# Patient Record
Sex: Female | Born: 1965 | Race: White | Hispanic: No | Marital: Married | Smoking: Former smoker
Health system: Southern US, Community
[De-identification: ages and names within clinical notes are randomized; demographics above are authoritative.]

## PROBLEM LIST (undated history)

## (undated) DIAGNOSIS — I471 Supraventricular tachycardia, unspecified: Secondary | ICD-10-CM

## (undated) DIAGNOSIS — E079 Disorder of thyroid, unspecified: Secondary | ICD-10-CM

## (undated) DIAGNOSIS — L900 Lichen sclerosus et atrophicus: Secondary | ICD-10-CM

## (undated) DIAGNOSIS — G4730 Sleep apnea, unspecified: Secondary | ICD-10-CM

## (undated) HISTORY — PX: TONSILLECTOMY: SUR1361

## (undated) HISTORY — DX: Sleep apnea, unspecified: G47.30

## (undated) HISTORY — DX: Lichen sclerosus et atrophicus: L90.0

## (undated) HISTORY — DX: Disorder of thyroid, unspecified: E07.9

## (undated) HISTORY — DX: Supraventricular tachycardia, unspecified: I47.10

---

## 1994-11-02 HISTORY — PX: CHOLECYSTECTOMY: SHX55

## 2015-12-05 ENCOUNTER — Ambulatory Visit (INDEPENDENT_AMBULATORY_CARE_PROVIDER_SITE_OTHER): Payer: BLUE CROSS/BLUE SHIELD | Admitting: Sports Medicine

## 2015-12-05 ENCOUNTER — Encounter: Payer: Self-pay | Admitting: Sports Medicine

## 2015-12-05 DIAGNOSIS — R079 Chest pain, unspecified: Secondary | ICD-10-CM

## 2015-12-05 DIAGNOSIS — E038 Other specified hypothyroidism: Secondary | ICD-10-CM

## 2015-12-05 DIAGNOSIS — F419 Anxiety disorder, unspecified: Secondary | ICD-10-CM | POA: Insufficient documentation

## 2015-12-05 DIAGNOSIS — E034 Atrophy of thyroid (acquired): Secondary | ICD-10-CM

## 2015-12-05 DIAGNOSIS — M2550 Pain in unspecified joint: Secondary | ICD-10-CM

## 2015-12-05 DIAGNOSIS — E039 Hypothyroidism, unspecified: Secondary | ICD-10-CM | POA: Insufficient documentation

## 2015-12-05 DIAGNOSIS — Z0000 Encounter for general adult medical examination without abnormal findings: Secondary | ICD-10-CM

## 2015-12-05 DIAGNOSIS — F32A Depression, unspecified: Secondary | ICD-10-CM | POA: Insufficient documentation

## 2015-12-05 DIAGNOSIS — R768 Other specified abnormal immunological findings in serum: Secondary | ICD-10-CM

## 2015-12-05 DIAGNOSIS — F411 Generalized anxiety disorder: Secondary | ICD-10-CM

## 2015-12-05 DIAGNOSIS — Z136 Encounter for screening for cardiovascular disorders: Secondary | ICD-10-CM | POA: Insufficient documentation

## 2015-12-05 DIAGNOSIS — R7689 Other specified abnormal immunological findings in serum: Secondary | ICD-10-CM | POA: Insufficient documentation

## 2015-12-05 MED ORDER — DULOXETINE HCL 30 MG PO CPEP: 30.0000 mg | ORAL_CAPSULE | Freq: Every day | ORAL | Status: DC

## 2015-12-05 NOTE — Assessment & Plan Note (Addendum)
Checking TSH. Has been seeing an integrative doctor.  Currently having iatrogenic hyperthyroidism with a suppressed TSH, adding T3 and T4, patient needs to return to her integrative medicine doctor for thyroid medication adjustment.

## 2015-12-05 NOTE — Assessment & Plan Note (Signed)
Referral for stress testing

## 2015-12-05 NOTE — Assessment & Plan Note (Signed)
Declines all vaccinations,up-to-date on cervical cancer screening

## 2015-12-05 NOTE — Assessment & Plan Note (Signed)
Also has significant widespread muscle aches. She likely has fibromyalgia in addition to generalized anxiety. Adding Cymbalta 30.

## 2015-12-05 NOTE — Progress Notes (Signed)
  Subjective:    CC: Establish care.   HPI:  This is a pleasant 50 year old female, she is here to discuss multiple issues, the most pressing of which is generalized anxiety. She has episodes of panic attacks, with occasional chest pain and palpitations. She has been hospitalized for cardiac rule outs but she has never had a stress test nor a catheterization.  On further questioning she has severe anhedonia, poor energy, poor appetite, guilt, difficulty concentrating, moderate depressed mood, difficulty sleeping, mild psychomotor retardation, and no suicidal or homicidal ideation, she also has severe nervousness, difficulty controlling her worry, worrying about different things, trouble relaxing, fear of impending doom, and moderate restlessness and irritability. She is agreeable to try pharmacologic intervention.  Hypothyroidism: Currently seeing an integrative medicine doctor.  Polyarthralgia: Multiple joints, hips, shoulders.  Past medical history, Surgical history, Family history not pertinant except as noted below, Social history, Allergies, and medications have been entered into the medical record, reviewed, and no changes needed.   Review of Systems: No headache, visual changes, nausea, vomiting, diarrhea, constipation, dizziness, abdominal pain, skin rash, fevers, chills, night sweats, swollen lymph nodes, weight loss, chest pain, body aches, joint swelling, muscle aches, shortness of breath, mood changes, visual or auditory hallucinations.  Objective:    General: Well Developed, well nourished, and in no acute distress.  Neuro: Alert and oriented x3, extra-ocular muscles intact, sensation grossly intact.  HEENT: Normocephalic, atraumatic, pupils equal round reactive to light, neck supple, no masses, no lymphadenopathy, thyroid nonpalpable.  Skin: Warm and dry, no rashes noted.  Cardiac: Regular rate and rhythm, no murmurs rubs or gallops.  Respiratory: Clear to auscultation  bilaterally. Not using accessory muscles, speaking in full sentences.  Abdominal: Soft, nontender, nondistended, positive bowel sounds, no masses, no organomegaly.  Musculoskeletal: Shoulder, elbow, wrist, hip, knee, ankle stable, and with full range of motion.  Impression and Recommendations:    The patient was counselled, risk factors were discussed, anticipatory guidance given.  I spent 45 minutes with this patient, greater than 50% was face-to-face time counseling regarding the above diagnoses

## 2015-12-05 NOTE — Assessment & Plan Note (Addendum)
Adding rheumatoid factor, CCP, and ANA.  Positive ANA with 1-80 titer, speckled pattern, and confirmatory lupus panel.

## 2015-12-06 ENCOUNTER — Telehealth: Payer: Self-pay

## 2015-12-06 DIAGNOSIS — F411 Generalized anxiety disorder: Secondary | ICD-10-CM

## 2015-12-06 MED ORDER — ALPRAZOLAM 0.5 MG PO TBDP: 0.5000 mg | ORAL_TABLET | Freq: Two times a day (BID) | ORAL | Status: DC | PRN

## 2015-12-06 NOTE — Telephone Encounter (Signed)
Continue Cymbalta for at least one month, adding a bit of Xanax.

## 2015-12-06 NOTE — Telephone Encounter (Signed)
Pt left 2 VM, first stating that she would like to switch from cymbalta to zoloft. Pt then returned call stating she will try the Cymbalta for a month but would like to know if you are willing to give her some Ativan or Xanax for panick attacks. Please advise.

## 2015-12-06 NOTE — Telephone Encounter (Signed)
Pt notified and appreciates your assistance

## 2015-12-19 LAB — CBC
HCT: 40.5 % (ref 36.0–46.0)
Hemoglobin: 13.1 g/dL (ref 12.0–15.0)
MCH: 27.3 pg (ref 26.0–34.0)
MCHC: 32.3 g/dL (ref 30.0–36.0)
MCV: 84.6 fL (ref 78.0–100.0)
MPV: 9.1 fL (ref 8.6–12.4)
Platelets: 364 10*3/uL (ref 150–400)
RBC: 4.79 MIL/uL (ref 3.87–5.11)
RDW: 14 % (ref 11.5–15.5)
WBC: 7.3 K/uL (ref 4.0–10.5)

## 2015-12-19 LAB — HEMOGLOBIN A1C
Hgb A1c MFr Bld: 5.8 % — ABNORMAL HIGH (ref <5.7)
Mean Plasma Glucose: 120 mg/dL — ABNORMAL HIGH (ref <117)

## 2015-12-20 ENCOUNTER — Encounter: Payer: BLUE CROSS/BLUE SHIELD | Admitting: Physician Assistant

## 2015-12-20 ENCOUNTER — Ambulatory Visit (INDEPENDENT_AMBULATORY_CARE_PROVIDER_SITE_OTHER): Payer: BLUE CROSS/BLUE SHIELD

## 2015-12-20 DIAGNOSIS — R079 Chest pain, unspecified: Secondary | ICD-10-CM

## 2015-12-20 LAB — EXERCISE TOLERANCE TEST
Estimated workload: 7 METS
Exercise duration (min): 5 min
Exercise duration (sec): 45 s
MPHR: 173 {beats}/min
Peak BP: 193 mmHg
Peak HR: 181 {beats}/min
Percent HR: 105 %
Percent of predicted max HR: 104 %
RPE: 15
Rest HR: 84 {beats}/min
Stage 1 DBP: 67 mmHg
Stage 1 Grade: 0 %
Stage 1 HR: 110 {beats}/min
Stage 1 SBP: 114 mmHg
Stage 1 Speed: 0 mph
Stage 2 Grade: 0 %
Stage 2 HR: 113 {beats}/min
Stage 2 Speed: 1 mph
Stage 3 Grade: 0.1 %
Stage 3 HR: 114 {beats}/min
Stage 3 Speed: 1 mph
Stage 4 DBP: 73 mmHg
Stage 4 Grade: 10 %
Stage 4 HR: 162 {beats}/min
Stage 4 SBP: 158 mmHg
Stage 4 Speed: 1.7 mph
Stage 5 DBP: 67 mmHg
Stage 5 Grade: 12 %
Stage 5 HR: 181 {beats}/min
Stage 5 SBP: 193 mmHg
Stage 5 Speed: 2.5 mph
Stage 6 DBP: 46 mmHg
Stage 6 Grade: 0 %
Stage 6 HR: 164 {beats}/min
Stage 6 SBP: 219 mmHg
Stage 6 Speed: 0 mph
Stage 7 DBP: 60 mmHg
Stage 7 Grade: 0 %
Stage 7 HR: 109 {beats}/min
Stage 7 SBP: 107 mmHg
Stage 7 Speed: 0 mph

## 2015-12-20 LAB — COMPREHENSIVE METABOLIC PANEL WITH GFR
ALT: 17 U/L (ref 6–29)
AST: 15 U/L (ref 10–35)
Albumin: 3.9 g/dL (ref 3.6–5.1)
Alkaline Phosphatase: 70 U/L (ref 33–115)
Calcium: 8.9 mg/dL (ref 8.6–10.2)
Glucose, Bld: 102 mg/dL — ABNORMAL HIGH (ref 65–99)
Total Bilirubin: 0.4 mg/dL (ref 0.2–1.2)
Total Protein: 6.3 g/dL (ref 6.1–8.1)

## 2015-12-20 LAB — LIPID PANEL
Cholesterol: 126 mg/dL (ref 125–200)
HDL: 40 mg/dL — ABNORMAL LOW (ref >=46)
LDL Cholesterol: 69 mg/dL (ref <130)
Total CHOL/HDL Ratio: 3.2 Ratio (ref <=5.0)
Triglycerides: 86 mg/dL (ref <150)
VLDL: 17 mg/dL (ref <30)

## 2015-12-20 LAB — TSH: TSH: 0.02 mIU/L — ABNORMAL LOW

## 2015-12-20 LAB — VITAMIN D 25 HYDROXY (VIT D DEFICIENCY, FRACTURES): Vit D, 25-Hydroxy: 71 ng/mL (ref 30–100)

## 2015-12-20 LAB — COMPREHENSIVE METABOLIC PANEL
BUN: 10 mg/dL (ref 7–25)
CO2: 26 mmol/L (ref 20–31)
Chloride: 104 mmol/L (ref 98–110)
Creat: 0.58 mg/dL (ref 0.50–1.10)
Potassium: 4.2 mmol/L (ref 3.5–5.3)
Sodium: 138 mmol/L (ref 135–146)

## 2015-12-20 LAB — ANTI-NUCLEAR AB-TITER (ANA TITER): ANA Titer 1: 1:80 {titer} — ABNORMAL HIGH

## 2015-12-20 LAB — ANA: Anti Nuclear Antibody(ANA): POSITIVE — AB

## 2015-12-20 LAB — CYCLIC CITRUL PEPTIDE ANTIBODY, IGG: Cyclic Citrullin Peptide Ab: <16 Units

## 2015-12-20 LAB — CK: Total CK: 45 U/L (ref 7–177)

## 2015-12-20 LAB — RHEUMATOID FACTOR: Rheumatoid fact SerPl-aCnc: <10 [IU]/mL (ref <=14)

## 2015-12-20 NOTE — Addendum Note (Signed)
Addended by: Monica Becton on: 12/20/2015 10:25 AM   Modules accepted: Orders

## 2015-12-21 LAB — T4, FREE: Free T4: 1.4 ng/dL (ref 0.8–1.8)

## 2015-12-21 LAB — T3, FREE: T3, Free: 4.2 pg/mL (ref 2.3–4.2)

## 2015-12-23 LAB — IGG, IGA, IGM
IgA: 239 mg/dL (ref 69–380)
IgG (Immunoglobin G), Serum: 841 mg/dL (ref 690–1700)
IgM, Serum: 122 mg/dL (ref 52–322)

## 2015-12-23 LAB — ANTI-NUCLEAR AB-TITER (ANA TITER): ANA Titer 1: 1:80 {titer} — ABNORMAL HIGH

## 2015-12-23 NOTE — Addendum Note (Signed)
Addended by: Monica Becton on: 12/23/2015 09:02 AM   Modules accepted: Orders

## 2015-12-24 LAB — ANA, IFA COMPREHENSIVE PANEL
Anti Nuclear Antibody(ANA): POSITIVE — AB
ENA SM Ab Ser-aCnc: <1
SM/RNP: <1
SSA (Ro) (ENA) Antibody, IgG: <1
SSB (La) (ENA) Antibody, IgG: <1
Scleroderma (Scl-70) (ENA) Antibody, IgG: <1
ds DNA Ab: <1 IU/mL

## 2015-12-24 LAB — JO-1 ANTIBODY-IGG: Jo-1 Antibody, IgG: <1

## 2015-12-24 LAB — HEPATITIS PANEL, ACUTE
HCV Ab: NEGATIVE
Hep A IgM: NONREACTIVE
Hep B C IgM: NONREACTIVE
Hepatitis B Surface Ag: NEGATIVE

## 2016-01-02 ENCOUNTER — Ambulatory Visit (INDEPENDENT_AMBULATORY_CARE_PROVIDER_SITE_OTHER): Payer: BLUE CROSS/BLUE SHIELD | Admitting: Sports Medicine

## 2016-01-02 ENCOUNTER — Encounter: Payer: Self-pay | Admitting: Sports Medicine

## 2016-01-02 VITALS — BP 142/65 | HR 69 | Wt 283.0 lb

## 2016-01-02 DIAGNOSIS — F411 Generalized anxiety disorder: Secondary | ICD-10-CM | POA: Diagnosis not present

## 2016-01-02 DIAGNOSIS — E038 Other specified hypothyroidism: Secondary | ICD-10-CM

## 2016-01-02 DIAGNOSIS — E034 Atrophy of thyroid (acquired): Secondary | ICD-10-CM | POA: Diagnosis not present

## 2016-01-02 MED ORDER — LEVOTHYROXINE SODIUM 137 MCG PO CAPS: 1.0000 | ORAL_CAPSULE | Freq: Every day | ORAL | Status: DC

## 2016-01-02 MED ORDER — DULOXETINE HCL 60 MG PO CPEP: 60.0000 mg | ORAL_CAPSULE | Freq: Every day | ORAL | Status: DC

## 2016-01-02 NOTE — Assessment & Plan Note (Signed)
Fantastic improvements on Cymbalta, increasing to 60 mg.

## 2016-01-02 NOTE — Progress Notes (Signed)
  Subjective:    CC:  Follow-up  HPI:  anxiety and depression: Ann Greene has had a fantastic improvement in symptoms with Cymbalta, happy with how things are going.   Hypothyroidism: agreeable to let me take over.  Past medical history, Surgical history, Family history not pertinant except as noted below, Social history, Allergies, and medications have been entered into the medical record, reviewed, and no changes needed.   Review of Systems: No fevers, chills, night sweats, weight loss, chest pain, or shortness of breath.   Objective:    General: Well Developed, well nourished, and in no acute distress.  Neuro: Alert and oriented x3, extra-ocular muscles intact, sensation grossly intact.  HEENT: Normocephalic, atraumatic, pupils equal round reactive to light, neck supple, no masses, no lymphadenopathy, thyroid nonpalpable.  Skin: Warm and dry, no rashes. Cardiac: Regular rate and rhythm, no murmurs rubs or gallops, no lower extremity edema.  Respiratory: Clear to auscultation bilaterally. Not using accessory muscles, speaking in full sentences.  Impression and Recommendations:    I spent 25 minutes with this patient, greater than 50% was face-to-face time counseling regarding the above diagnoses

## 2016-01-02 NOTE — Assessment & Plan Note (Signed)
Iatrogenic hyperthyroidism caused by integrative medicine. Stop all thyroid hormone and switched to levothyroxine 137 g, then recheck TSH in 6 weeks.

## 2016-01-27 ENCOUNTER — Other Ambulatory Visit: Payer: Self-pay

## 2016-01-27 DIAGNOSIS — E034 Atrophy of thyroid (acquired): Secondary | ICD-10-CM

## 2016-01-29 LAB — TSH: TSH: 0.88 mIU/L

## 2016-01-30 ENCOUNTER — Ambulatory Visit (INDEPENDENT_AMBULATORY_CARE_PROVIDER_SITE_OTHER): Payer: BLUE CROSS/BLUE SHIELD | Admitting: Sports Medicine

## 2016-01-30 ENCOUNTER — Encounter: Payer: Self-pay | Admitting: Sports Medicine

## 2016-01-30 VITALS — BP 110/73 | HR 73 | Wt 285.0 lb

## 2016-01-30 DIAGNOSIS — E669 Obesity, unspecified: Secondary | ICD-10-CM

## 2016-01-30 DIAGNOSIS — E034 Atrophy of thyroid (acquired): Secondary | ICD-10-CM

## 2016-01-30 DIAGNOSIS — E038 Other specified hypothyroidism: Secondary | ICD-10-CM

## 2016-01-30 DIAGNOSIS — F411 Generalized anxiety disorder: Secondary | ICD-10-CM | POA: Diagnosis not present

## 2016-01-30 DIAGNOSIS — R079 Chest pain, unspecified: Secondary | ICD-10-CM

## 2016-01-30 MED ORDER — PHENTERMINE HCL 37.5 MG PO TABS: ORAL_TABLET | ORAL | Status: DC

## 2016-01-30 NOTE — Assessment & Plan Note (Signed)
Negative treadmill stress test in February 2017, starting phentermine.

## 2016-01-30 NOTE — Assessment & Plan Note (Signed)
And tested improvement with the increase to 60 mg of Cymbalta with excellent improvement scores. No changes.

## 2016-01-30 NOTE — Progress Notes (Signed)
  Subjective:    CC: Follow-up  HPI: Anxiety and depression: Fantastic improvements in PHQ9 and GAD7 scores with Cymbalta 60.  Obesity: Agrees to try weight loss medication.   Hyperthyroidism: this was iatrogenic, now within normal TSH on levothyroxine alone  Past medical history, Surgical history, Family history not pertinant except as noted below, Social history, Allergies, and medications have been entered into the medical record, reviewed, and no changes needed.   Review of Systems: No fevers, chills, night sweats, weight loss, chest pain, or shortness of breath.   Objective:    General: Well Developed, well nourished, and in no acute distress.  Neuro: Alert and oriented x3, extra-ocular muscles intact, sensation grossly intact.  HEENT: Normocephalic, atraumatic, pupils equal round reactive to light, neck supple, no masses, no lymphadenopathy, thyroid nonpalpable.  Skin: Warm and dry, no rashes. Cardiac: Regular rate and rhythm, no murmurs rubs or gallops, no lower extremity edema.  Respiratory: Clear to auscultation bilaterally. Not using accessory muscles, speaking in full sentences.  Impression and Recommendations:    I spent 25 minutes with this patient, greater than 50% was face-to-face time counseling regarding the above diagnoses

## 2016-01-30 NOTE — Assessment & Plan Note (Signed)
TSH is now well-controlled on levothyroxine alone. Some of her anxiety was likely secondary to iatrogenic hyperthyroidism.

## 2016-01-30 NOTE — Assessment & Plan Note (Signed)
Starting phentermine. Return monthly for weight checks and refills. 

## 2016-02-27 ENCOUNTER — Ambulatory Visit: Payer: BLUE CROSS/BLUE SHIELD | Admitting: Sports Medicine

## 2016-08-24 ENCOUNTER — Other Ambulatory Visit: Payer: Self-pay | Admitting: *Deleted

## 2016-08-24 DIAGNOSIS — F411 Generalized anxiety disorder: Secondary | ICD-10-CM

## 2016-08-24 MED ORDER — DULOXETINE HCL 60 MG PO CPEP: 60.0000 mg | ORAL_CAPSULE | Freq: Every day | ORAL | 0 refills | Status: DC

## 2016-11-21 ENCOUNTER — Other Ambulatory Visit: Payer: Self-pay | Admitting: Sports Medicine

## 2016-11-21 DIAGNOSIS — E034 Atrophy of thyroid (acquired): Secondary | ICD-10-CM

## 2016-11-26 ENCOUNTER — Ambulatory Visit (INDEPENDENT_AMBULATORY_CARE_PROVIDER_SITE_OTHER): Payer: BLUE CROSS/BLUE SHIELD | Admitting: Sports Medicine

## 2016-11-26 ENCOUNTER — Encounter: Payer: Self-pay | Admitting: Sports Medicine

## 2016-11-26 VITALS — BP 135/85 | HR 76 | Resp 18 | Wt 301.9 lb

## 2016-11-26 DIAGNOSIS — M1711 Unilateral primary osteoarthritis, right knee: Secondary | ICD-10-CM | POA: Diagnosis not present

## 2016-11-26 DIAGNOSIS — E669 Obesity, unspecified: Secondary | ICD-10-CM

## 2016-11-26 DIAGNOSIS — M170 Bilateral primary osteoarthritis of knee: Secondary | ICD-10-CM | POA: Insufficient documentation

## 2016-11-26 DIAGNOSIS — M7071 Other bursitis of hip, right hip: Secondary | ICD-10-CM | POA: Diagnosis not present

## 2016-11-26 MED ORDER — MELOXICAM 15 MG PO TABS: ORAL_TABLET | ORAL | 3 refills | Status: DC

## 2016-11-26 NOTE — Assessment & Plan Note (Signed)
Did not do well with phentermine, she is going to research the other weight loss medications and let me know. We did discuss gastric sleeve

## 2016-11-26 NOTE — Assessment & Plan Note (Signed)
X-rays, meloxicam, rehabilitation exercises, weight loss. Return in one month.

## 2016-11-26 NOTE — Progress Notes (Signed)
  Subjective:    CC:  Multiple complaints  HPI: Ann Greene is a pleasant 51 year old female with obesity, she is desiring some help with weight loss.  She is also been having pain that she localizes in her right buttock with radiation into the thigh, moderate, persistent. No trauma.  In addition she has pain in her right knee, medial joint line with occasional mechanical symptoms, has a diagnosis of arthritis is really not taking any NSAIDs, her pharmacist told her that she couldn't take anti-inflammatories with Cymbalta.  Past medical history:  Negative.  See flowsheet/record as well for more information.  Surgical history: Negative.  See flowsheet/record as well for more information.  Family history: Negative.  See flowsheet/record as well for more information.  Social history: Negative.  See flowsheet/record as well for more information.  Allergies, and medications have been entered into the medical record, reviewed, and no changes needed.   Review of Systems: No fevers, chills, night sweats, weight loss, chest pain, or shortness of breath.   Objective:    General: Well Developed, well nourished, and in no acute distress.  Neuro: Alert and oriented x3, extra-ocular muscles intact, sensation grossly intact.  HEENT: Normocephalic, atraumatic, pupils equal round reactive to light, neck supple, no masses, no lymphadenopathy, thyroid nonpalpable.  Skin: Warm and dry, no rashes. Cardiac: Regular rate and rhythm, no murmurs rubs or gallops, no lower extremity edema.  Respiratory: Clear to auscultation bilaterally. Not using accessory muscles, speaking in full sentences. Right Hip: ROM IR: 60 Deg, ER: 60 Deg, Flexion: 120 Deg, Extension: 100 Deg, Abduction: 45 Deg, Adduction: 45 Deg Strength IR: 5/5, ER: 5/5, Flexion: 5/5, Extension: 5/5, Abduction: 5/5, Adduction: 5/5 Pelvic alignment unremarkable to inspection and palpation. Standing hip rotation and gait without trendelenburg /  unsteadiness. Greater trochanter without tenderness to palpation. No tenderness over piriformis. No SI joint tenderness and normal minimal SI movement. Moderate tenderness over the right ischial tuberosity Right Knee: Normal to inspection with no erythema or effusion or obvious bony abnormalities. Tender at the medial joint line. ROM normal in flexion and extension and lower leg rotation. Ligaments with solid consistent endpoints including ACL, PCL, LCL, MCL. Negative Mcmurray's and provocative meniscal tests. Non painful patellar compression. Patellar and quadriceps tendons unremarkable. Hamstring and quadriceps strength is normal.  Impression and Recommendations:    Obesity Did not do well with phentermine, she is going to research the other weight loss medications and let me know. We did discuss gastric sleeve  Primary osteoarthritis of right knee X-rays, meloxicam, rehabilitation exercises, weight loss. Return in one month.  Ischial bursitis, right Meloxicam, rehabilitation exercises, weight loss.. Pelvic x-rays

## 2016-11-26 NOTE — Assessment & Plan Note (Signed)
Meloxicam, rehabilitation exercises, weight loss.. Pelvic x-rays

## 2016-11-30 ENCOUNTER — Other Ambulatory Visit: Payer: Self-pay | Admitting: Sports Medicine

## 2016-11-30 ENCOUNTER — Ambulatory Visit (INDEPENDENT_AMBULATORY_CARE_PROVIDER_SITE_OTHER): Payer: BLUE CROSS/BLUE SHIELD

## 2016-11-30 DIAGNOSIS — M25551 Pain in right hip: Secondary | ICD-10-CM

## 2016-11-30 DIAGNOSIS — M1711 Unilateral primary osteoarthritis, right knee: Secondary | ICD-10-CM

## 2016-11-30 DIAGNOSIS — M25562 Pain in left knee: Secondary | ICD-10-CM

## 2016-11-30 DIAGNOSIS — M25561 Pain in right knee: Secondary | ICD-10-CM | POA: Diagnosis not present

## 2016-11-30 DIAGNOSIS — M179 Osteoarthritis of knee, unspecified: Secondary | ICD-10-CM | POA: Diagnosis not present

## 2016-11-30 DIAGNOSIS — Z1239 Encounter for other screening for malignant neoplasm of breast: Secondary | ICD-10-CM

## 2016-11-30 DIAGNOSIS — M7071 Other bursitis of hip, right hip: Secondary | ICD-10-CM

## 2016-12-04 ENCOUNTER — Ambulatory Visit: Payer: BLUE CROSS/BLUE SHIELD

## 2016-12-09 ENCOUNTER — Telehealth: Payer: Self-pay

## 2016-12-09 MED ORDER — NALTREXONE-BUPROPION HCL ER 8-90 MG PO TB12: ORAL_TABLET | ORAL | 0 refills | Status: DC

## 2016-12-09 NOTE — Telephone Encounter (Signed)
Pt left VM stating she would like to give Contrave a try. Please assist.

## 2016-12-09 NOTE — Telephone Encounter (Signed)
Rx and discount coupon in my box.

## 2016-12-10 NOTE — Telephone Encounter (Signed)
Notified. 

## 2016-12-11 ENCOUNTER — Ambulatory Visit: Payer: BLUE CROSS/BLUE SHIELD

## 2016-12-17 ENCOUNTER — Other Ambulatory Visit: Payer: Self-pay

## 2016-12-17 DIAGNOSIS — E034 Atrophy of thyroid (acquired): Secondary | ICD-10-CM

## 2016-12-17 MED ORDER — LEVOTHYROXINE SODIUM 137 MCG PO CAPS: 1.0000 | ORAL_CAPSULE | Freq: Every day | ORAL | 2 refills | Status: DC

## 2016-12-22 ENCOUNTER — Telehealth: Payer: Self-pay | Admitting: *Deleted

## 2016-12-22 NOTE — Telephone Encounter (Signed)
Contrave approved.Effective dates are 12/22/2016-06/19/2017.patient and pharm notified

## 2016-12-22 NOTE — Telephone Encounter (Signed)
[id number] to cover my meds.

## 2016-12-24 DIAGNOSIS — L814 Other melanin hyperpigmentation: Secondary | ICD-10-CM | POA: Diagnosis not present

## 2016-12-24 DIAGNOSIS — D1801 Hemangioma of skin and subcutaneous tissue: Secondary | ICD-10-CM | POA: Diagnosis not present

## 2016-12-24 DIAGNOSIS — L821 Other seborrheic keratosis: Secondary | ICD-10-CM | POA: Diagnosis not present

## 2016-12-26 ENCOUNTER — Other Ambulatory Visit: Payer: Self-pay | Admitting: Sports Medicine

## 2016-12-26 DIAGNOSIS — F411 Generalized anxiety disorder: Secondary | ICD-10-CM

## 2016-12-28 ENCOUNTER — Ambulatory Visit: Payer: BLUE CROSS/BLUE SHIELD | Admitting: Sports Medicine

## 2017-01-11 ENCOUNTER — Ambulatory Visit: Payer: BLUE CROSS/BLUE SHIELD | Admitting: Sports Medicine

## 2017-03-24 ENCOUNTER — Other Ambulatory Visit: Payer: Self-pay | Admitting: Sports Medicine

## 2017-03-24 DIAGNOSIS — F411 Generalized anxiety disorder: Secondary | ICD-10-CM

## 2017-04-06 ENCOUNTER — Ambulatory Visit (INDEPENDENT_AMBULATORY_CARE_PROVIDER_SITE_OTHER): Payer: BLUE CROSS/BLUE SHIELD | Admitting: Sports Medicine

## 2017-04-06 ENCOUNTER — Encounter: Payer: Self-pay | Admitting: Sports Medicine

## 2017-04-06 VITALS — BP 124/80 | HR 94 | Resp 16 | Wt 303.6 lb

## 2017-04-06 DIAGNOSIS — N3090 Cystitis, unspecified without hematuria: Secondary | ICD-10-CM

## 2017-04-06 DIAGNOSIS — R300 Dysuria: Secondary | ICD-10-CM

## 2017-04-06 LAB — POCT URINALYSIS DIPSTICK
Bilirubin, UA: NEGATIVE
Glucose, UA: NEGATIVE
Ketones, UA: NEGATIVE
Nitrite, UA: NEGATIVE
Protein, UA: 30
Spec Grav, UA: 1.02 (ref 1.010–1.025)
Urobilinogen, UA: 0.2 U/dL
pH, UA: 8 (ref 5.0–8.0)

## 2017-04-06 MED ORDER — CEPHALEXIN 500 MG PO CAPS: 500.0000 mg | ORAL_CAPSULE | Freq: Two times a day (BID) | ORAL | 0 refills | Status: DC

## 2017-04-06 MED ORDER — PHENAZOPYRIDINE HCL 200 MG PO TABS: 200.0000 mg | ORAL_TABLET | Freq: Three times a day (TID) | ORAL | 0 refills | Status: AC

## 2017-04-06 NOTE — Assessment & Plan Note (Signed)
Classic urinary tract infection.  Urine culture, Keflex, phenazopyridine.  Return if no better.

## 2017-04-06 NOTE — Progress Notes (Signed)
  Subjective:    CC: possible UTI  HPI: Ms. Ann Greene is a 51 y.o. Female who presents with suspected urinary tract infection. She started having pressure on Friday, as well as frequency, urgency, painful urination. Pt denies fever, vomiting, diarrhea, but does complain of occasional nausea. She started taking OTC urostat over the weekend that briefly improved her symptoms, but she is still having symptoms today.    Urinalysis positive for trace lysed erythrocytes, moderate leukocytes, otherwise unremarkable.  Past medical history:  Negative.  See flowsheet/record as well for more information.  Surgical history: Negative.  See flowsheet/record as well for more information.  Family history: Negative.  See flowsheet/record as well for more information.  Social history: Negative.  See flowsheet/record as well for more information.  Allergies, and medications have been entered into the medical record, reviewed, and no changes needed.   Review of Systems: No fevers, chills, night sweats, weight loss, chest pain, or shortness of breath.   Objective:    General: Well Developed, well nourished, and in no acute distress.  Neuro: Alert and oriented x3, extra-ocular muscles intact, sensation grossly intact.  HEENT: Normocephalic, atraumatic, pupils equal round reactive to light, neck supple, no masses, no lymphadenopathy, thyroid nonpalpable.  Skin: Warm and dry, no rashes. Cardiac: Regular rate and rhythm, no murmurs rubs or gallops, no lower extremity edema.  Respiratory: Clear to auscultation bilaterally. Not using accessory muscles, speaking in full sentences. Abdomen: Normal bowel sounds, normal to percussion. TTP over the bladder. No CVA tenderness.   Impression and Recommendations:    Ms. Ann Greene is a 51 y.o. Female who presents with acute cystitis.  Urinalysis performed. Findings were consistent with UTI. Clinical presentation was consistent with cystitis. Urine culture sent. Started  keflex, phenazopyridine. Follow-up if no better.

## 2017-04-06 NOTE — Patient Instructions (Signed)

## 2017-04-09 LAB — URINE CULTURE

## 2017-05-17 ENCOUNTER — Ambulatory Visit (INDEPENDENT_AMBULATORY_CARE_PROVIDER_SITE_OTHER): Payer: BLUE CROSS/BLUE SHIELD | Admitting: Sports Medicine

## 2017-05-17 ENCOUNTER — Encounter: Payer: Self-pay | Admitting: Sports Medicine

## 2017-05-17 ENCOUNTER — Telehealth: Payer: Self-pay

## 2017-05-17 DIAGNOSIS — F411 Generalized anxiety disorder: Secondary | ICD-10-CM | POA: Diagnosis not present

## 2017-05-17 DIAGNOSIS — E034 Atrophy of thyroid (acquired): Secondary | ICD-10-CM

## 2017-05-17 DIAGNOSIS — Z0000 Encounter for general adult medical examination without abnormal findings: Secondary | ICD-10-CM | POA: Diagnosis not present

## 2017-05-17 DIAGNOSIS — E6609 Other obesity due to excess calories: Secondary | ICD-10-CM | POA: Diagnosis not present

## 2017-05-17 LAB — LIPID PANEL W/REFLEX DIRECT LDL
Cholesterol: 220 mg/dL — ABNORMAL HIGH (ref <200)
HDL: 46 mg/dL — ABNORMAL LOW (ref >50)
LDL-Cholesterol: 143 mg/dL — ABNORMAL HIGH
Non-HDL Cholesterol (Calc): 174 mg/dL — ABNORMAL HIGH (ref <130)
Total CHOL/HDL Ratio: 4.8 Ratio (ref <5.0)
Triglycerides: 169 mg/dL — ABNORMAL HIGH (ref <150)

## 2017-05-17 LAB — CBC
HCT: 42.6 % (ref 35.0–45.0)
Hemoglobin: 13.9 g/dL (ref 11.7–15.5)
MCH: 28.3 pg (ref 27.0–33.0)
MCHC: 32.6 g/dL (ref 32.0–36.0)
MCV: 86.6 fL (ref 80.0–100.0)
MPV: 8.9 fL (ref 7.5–12.5)
Platelets: 402 10*3/uL — ABNORMAL HIGH (ref 140–400)
RBC: 4.92 MIL/uL (ref 3.80–5.10)
RDW: 14.4 % (ref 11.0–15.0)
WBC: 11.2 K/uL — ABNORMAL HIGH (ref 3.8–10.8)

## 2017-05-17 LAB — COMPREHENSIVE METABOLIC PANEL WITH GFR
ALT: 12 U/L (ref 6–29)
AST: 12 U/L (ref 10–35)
Albumin: 4.3 g/dL (ref 3.6–5.1)
Alkaline Phosphatase: 73 U/L (ref 33–115)
Total Bilirubin: 0.4 mg/dL (ref 0.2–1.2)
Total Protein: 6.9 g/dL (ref 6.1–8.1)

## 2017-05-17 LAB — COMPREHENSIVE METABOLIC PANEL
BUN: 11 mg/dL (ref 7–25)
CO2: 26 mmol/L (ref 20–31)
Calcium: 9.1 mg/dL (ref 8.6–10.2)
Chloride: 101 mmol/L (ref 98–110)
Creat: 0.74 mg/dL (ref 0.50–1.10)
Glucose, Bld: 99 mg/dL (ref 65–99)
Potassium: 4.4 mmol/L (ref 3.5–5.3)
Sodium: 136 mmol/L (ref 135–146)

## 2017-05-17 LAB — TSH: TSH: 5.37 mIU/L — ABNORMAL HIGH

## 2017-05-17 MED ORDER — PHENTERMINE HCL 37.5 MG PO TABS: ORAL_TABLET | ORAL | 0 refills | Status: DC

## 2017-05-17 NOTE — Progress Notes (Signed)
  Subjective:    CC: Follow-up  HPI: Anxiety: Controlled  Hypothyroidism: Needs a TSH rechecked.  Obesity: Did not have a good response to Contrave, would like to restart phentermine.  Past medical history:  Negative.  See flowsheet/record as well for more information.  Surgical history: Negative.  See flowsheet/record as well for more information.  Family history: Negative.  See flowsheet/record as well for more information.  Social history: Negative.  See flowsheet/record as well for more information.  Allergies, and medications have been entered into the medical record, reviewed, and no changes needed.   Review of Systems: No fevers, chills, night sweats, weight loss, chest pain, or shortness of breath.   Objective:    General: Well Developed, Morbidly obese, and in no acute distress.  Neuro: Alert and oriented x3, extra-ocular muscles intact, sensation grossly intact.  HEENT: Normocephalic, atraumatic, pupils equal round reactive to light, neck supple, no masses, no lymphadenopathy, thyroid nonpalpable.  Skin: Warm and dry, no rashes. Cardiac: Regular rate and rhythm, no murmurs rubs or gallops, no lower extremity edema.  Respiratory: Clear to auscultation bilaterally. Not using accessory muscles, speaking in full sentences.  Impression and Recommendations:    Generalized anxiety disorder Overall doing okay, no changes in medication. She does have difficulty concentrating, poor sleep and has a lot going on in her life, we will keep the dose of Cymbalta at 60 mg.  Annual physical exam Rechecking routine blood work.  Hypothyroidism Rechecking TSH  Obesity Restarting phentermine, continue low-carb diet, Weight [employer]. Return monthly for weight checks and refills.

## 2017-05-17 NOTE — Telephone Encounter (Signed)
PA for Phentermine submitted via CoverMyMeds. Awaiting decision.

## 2017-05-17 NOTE — Assessment & Plan Note (Addendum)
Rechecking TSH. TSH is elevated, increasing levothyroxine to 150 g, recheck in 6 weeks.

## 2017-05-17 NOTE — Assessment & Plan Note (Signed)
Overall doing okay, no changes in medication. She does have difficulty concentrating, poor sleep and has a lot going on in her life, we will keep the dose of Cymbalta at 60 mg.

## 2017-05-17 NOTE — Assessment & Plan Note (Signed)
Restarting phentermine, continue low-carb diet, Weight [employer]. Return monthly for weight checks and refills.

## 2017-05-17 NOTE — Assessment & Plan Note (Signed)
Rechecking routine blood work. 

## 2017-05-18 LAB — HEMOGLOBIN A1C
Hgb A1c MFr Bld: 5.7 % — ABNORMAL HIGH (ref <5.7)
Mean Plasma Glucose: 117 mg/dL

## 2017-05-18 LAB — HIV ANTIBODY (ROUTINE TESTING W REFLEX): HIV 1&2 Ab, 4th Generation: NONREACTIVE

## 2017-05-18 LAB — VITAMIN D 25 HYDROXY (VIT D DEFICIENCY, FRACTURES): Vit D, 25-Hydroxy: 26 ng/mL — ABNORMAL LOW (ref 30–100)

## 2017-05-18 MED ORDER — VITAMIN D (ERGOCALCIFEROL) 1.25 MG (50000 UNIT) PO CAPS: 50000.0000 [IU] | ORAL_CAPSULE | ORAL | 0 refills | Status: DC

## 2017-05-18 MED ORDER — LEVOTHYROXINE SODIUM 150 MCG PO CAPS: 1.0000 | ORAL_CAPSULE | Freq: Every day | ORAL | 3 refills | Status: DC

## 2017-05-18 NOTE — Addendum Note (Signed)
Addended by: Monica BectonHEKKEKANDAM, THOMAS J on: 05/18/2017 09:26 AM   Modules accepted: Orders

## 2017-05-19 NOTE — Telephone Encounter (Signed)
PA for phentermine has been denied. Reason: The request does not meed the definition of Medical Necessity found in the member's benefit booklet. Member is using an interacting medication, a serotoninergic agent.

## 2017-05-19 NOTE — Telephone Encounter (Signed)
Information discussed with pt. Pt verbalized understanding. 

## 2017-05-19 NOTE — Telephone Encounter (Signed)
Cymbalta does not interact with phentermine, also it sounds as though they just don't cover weight loss medications. She should just pay cash for it.

## 2017-06-09 ENCOUNTER — Other Ambulatory Visit: Payer: Self-pay | Admitting: *Deleted

## 2017-06-09 ENCOUNTER — Ambulatory Visit (INDEPENDENT_AMBULATORY_CARE_PROVIDER_SITE_OTHER): Payer: BLUE CROSS/BLUE SHIELD | Admitting: Sports Medicine

## 2017-06-09 DIAGNOSIS — E6609 Other obesity due to excess calories: Secondary | ICD-10-CM | POA: Diagnosis not present

## 2017-06-09 DIAGNOSIS — E034 Atrophy of thyroid (acquired): Secondary | ICD-10-CM

## 2017-06-09 LAB — TSH: TSH: 1.34 m[IU]/L

## 2017-06-09 MED ORDER — PHENTERMINE HCL 37.5 MG PO TABS: ORAL_TABLET | ORAL | 0 refills | Status: DC

## 2017-06-09 NOTE — Progress Notes (Signed)
  Subjective:    CC: Follow-up  HPI: Obesity: 8 pound weight loss after the first month on phentermine, no complaints, no questions.  Past medical history:  Negative.  See flowsheet/record as well for more information.  Surgical history: Negative.  See flowsheet/record as well for more information.  Family history: Negative.  See flowsheet/record as well for more information.  Social history: Negative.  See flowsheet/record as well for more information.  Allergies, and medications have been entered into the medical record, reviewed, and no changes needed.   Review of Systems: No fevers, chills, night sweats, weight loss, chest pain, or shortness of breath.   Objective:    General: Well Developed, well nourished, and in no acute distress.  Neuro: Alert and oriented x3, extra-ocular muscles intact, sensation grossly intact.  HEENT: Normocephalic, atraumatic, pupils equal round reactive to light, neck supple, no masses, no lymphadenopathy, thyroid nonpalpable.  Skin: Warm and dry, no rashes. Cardiac: Regular rate and rhythm, no murmurs rubs or gallops, no lower extremity edema.  Respiratory: Clear to auscultation bilaterally. Not using accessory muscles, speaking in full sentences.  Impression and Recommendations:    Obesity 8 pound weight loss after the first month on phentermine, doing well. Return in one month.

## 2017-06-09 NOTE — Assessment & Plan Note (Signed)
8 pound weight loss after the first month on phentermine, doing well. Return in one month.

## 2017-06-20 ENCOUNTER — Other Ambulatory Visit: Payer: Self-pay | Admitting: Sports Medicine

## 2017-06-20 DIAGNOSIS — F411 Generalized anxiety disorder: Secondary | ICD-10-CM

## 2017-07-07 ENCOUNTER — Ambulatory Visit (INDEPENDENT_AMBULATORY_CARE_PROVIDER_SITE_OTHER): Payer: BLUE CROSS/BLUE SHIELD | Admitting: Sports Medicine

## 2017-07-07 ENCOUNTER — Encounter: Payer: Self-pay | Admitting: Sports Medicine

## 2017-07-07 DIAGNOSIS — M722 Plantar fascial fibromatosis: Secondary | ICD-10-CM | POA: Diagnosis not present

## 2017-07-07 DIAGNOSIS — E6609 Other obesity due to excess calories: Secondary | ICD-10-CM | POA: Diagnosis not present

## 2017-07-07 MED ORDER — PHENTERMINE HCL 37.5 MG PO TABS: ORAL_TABLET | ORAL | 0 refills | Status: DC

## 2017-07-07 NOTE — Assessment & Plan Note (Signed)
3 pound weight loss, entering the third month, advised that she needs to lose at least 5 pounds month.

## 2017-07-07 NOTE — Assessment & Plan Note (Signed)
Starting very conservative per patient request. Rehabilitation exercises given only. If insufficient relief we will proceed with night splinting and custom orthotics.

## 2017-07-07 NOTE — Progress Notes (Signed)
  Subjective:    CC: Weight check  HPI: Obesity: Only 3 pound weight loss.  Right heel pain: Worse since she's been working out more, localized on the plantar aspect of the heel, worse with the first few steps in the morning, moderate, persistent without radiation.  Past medical history:  Negative.  See flowsheet/record as well for more information.  Surgical history: Negative.  See flowsheet/record as well for more information.  Family history: Negative.  See flowsheet/record as well for more information.  Social history: Negative.  See flowsheet/record as well for more information.  Allergies, and medications have been entered into the medical record, reviewed, and no changes needed.   Review of Systems: No fevers, chills, night sweats, weight loss, chest pain, or shortness of breath.   Objective:    General: Well Developed, well nourished, and in no acute distress.  Neuro: Alert and oriented x3, extra-ocular muscles intact, sensation grossly intact.  HEENT: Normocephalic, atraumatic, pupils equal round reactive to light, neck supple, no masses, no lymphadenopathy, thyroid nonpalpable.  Skin: Warm and dry, no rashes. Cardiac: Regular rate and rhythm, no murmurs rubs or gallops, no lower extremity edema.  Respiratory: Clear to auscultation bilaterally. Not using accessory muscles, speaking in full sentences. Right Foot: No visible erythema or swelling. Range of motion is full in all directions. Strength is 5/5 in all directions. No hallux valgus. No pes cavus or pes planus. No abnormal callus noted. No pain over the navicular prominence, or base of fifth metatarsal. Tender to palpation of the calcaneal insertion of plantar fascia. No pain at the Achilles insertion. No pain over the calcaneal bursa. No pain of the retrocalcaneal bursa. No tenderness to palpation over the tarsals, metatarsals, or phalanges. No hallux rigidus or limitus. No tenderness palpation over  interphalangeal joints. No pain with compression of the metatarsal heads. Neurovascularly intact distally.  Impression and Recommendations:    Obesity 3 pound weight loss, entering the third month, advised that she needs to lose at least 5 pounds month.  Plantar fasciitis, right Starting very conservative per patient request. Rehabilitation exercises given only. If insufficient relief we will proceed with night splinting and custom orthotics.   ___________________________________________ Ihor Austinhomas J. Benjamin Stainhekkekandam, M.D., ABFM., CAQSM. Primary Care and Sports Medicine [facility]  Adjunct Instructor of Family Medicine  [employer]

## 2017-07-12 ENCOUNTER — Other Ambulatory Visit: Payer: Self-pay | Admitting: Sports Medicine

## 2017-07-12 DIAGNOSIS — E034 Atrophy of thyroid (acquired): Secondary | ICD-10-CM

## 2017-07-17 ENCOUNTER — Other Ambulatory Visit: Payer: Self-pay | Admitting: Sports Medicine

## 2017-07-17 DIAGNOSIS — E034 Atrophy of thyroid (acquired): Secondary | ICD-10-CM

## 2017-07-29 ENCOUNTER — Ambulatory Visit: Payer: BLUE CROSS/BLUE SHIELD | Admitting: Sports Medicine

## 2017-08-16 ENCOUNTER — Ambulatory Visit (INDEPENDENT_AMBULATORY_CARE_PROVIDER_SITE_OTHER): Payer: BLUE CROSS/BLUE SHIELD | Admitting: Sports Medicine

## 2017-08-16 ENCOUNTER — Encounter: Payer: Self-pay | Admitting: Sports Medicine

## 2017-08-16 DIAGNOSIS — E782 Mixed hyperlipidemia: Secondary | ICD-10-CM

## 2017-08-16 DIAGNOSIS — M722 Plantar fascial fibromatosis: Secondary | ICD-10-CM

## 2017-08-16 DIAGNOSIS — E6609 Other obesity due to excess calories: Secondary | ICD-10-CM | POA: Diagnosis not present

## 2017-08-16 MED ORDER — PHENTERMINE HCL 37.5 MG PO TABS: ORAL_TABLET | ORAL | 0 refills | Status: DC

## 2017-08-16 MED ORDER — TOPIRAMATE 50 MG PO TABS: ORAL_TABLET | ORAL | 0 refills | Status: DC

## 2017-08-16 NOTE — Assessment & Plan Note (Signed)
Rechecking lipids, she will come back fasting. She does have nonspecific fatigue and would like to start B12 supplementation, I advised her that we should probably check the levels first to see if she's even low.

## 2017-08-16 NOTE — Progress Notes (Signed)
  Subjective:    CC: Follow-up  HPI: Obesity: Actually gained weight over the last month.  Plantar fasciitis: Has not responded to conservative measures, his agreeable proceed with night splint and custom orthotics. Pain is moderate, persistent, localized on the calcaneal insertion of the plantar fascia of the right heel.  Hyperlipidemia: Due to recheck.  Fatigue: Nonspecific, she is asking if B12 supplementation as helpful.  Past medical history:  Negative.  See flowsheet/record as well for more information.  Surgical history: Negative.  See flowsheet/record as well for more information.  Family history: Negative.  See flowsheet/record as well for more information.  Social history: Negative.  See flowsheet/record as well for more information.  Allergies, and medications have been entered into the medical record, reviewed, and no changes needed.   Review of Systems: No fevers, chills, night sweats, weight loss, chest pain, or shortness of breath.   Objective:    General: Well Developed, well nourished, and in no acute distress.  Neuro: Alert and oriented x3, extra-ocular muscles intact, sensation grossly intact.  HEENT: Normocephalic, atraumatic, pupils equal round reactive to light, neck supple, no masses, no lymphadenopathy, thyroid nonpalpable.  Skin: Warm and dry, no rashes. Cardiac: Regular rate and rhythm, no murmurs rubs or gallops, no lower extremity edema.  Respiratory: Clear to auscultation bilaterally. Not using accessory muscles, speaking in full sentences.  Impression and Recommendations:    Obesity Gained weight over the last month, entering the fourth month, refilling phentermine and adding Topamax.  Plantar fasciitis, right Started conservative measures, rehabilitation exercises have been insufficient, night splint, return for custom orthotics.  Hyperlipidemia, mixed Rechecking lipids, she will come back fasting. She does have nonspecific fatigue and would like  to start B12 supplementation, I advised her that we should probably check the levels first to see if she's even low.  ___________________________________________ [name]. [name], M.D., ABFM., CAQSM. Primary Care and Sports Medicine [facility]  Adjunct Instructor of Family Medicine  [employer]

## 2017-08-16 NOTE — Assessment & Plan Note (Signed)
Gained weight over the last month, entering the fourth month, refilling phentermine and adding Topamax.

## 2017-08-16 NOTE — Assessment & Plan Note (Signed)
Started conservative measures, rehabilitation exercises have been insufficient, night splint, return for custom orthotics.

## 2017-08-18 ENCOUNTER — Other Ambulatory Visit: Payer: Self-pay | Admitting: Sports Medicine

## 2017-08-18 DIAGNOSIS — E6609 Other obesity due to excess calories: Secondary | ICD-10-CM

## 2017-09-13 ENCOUNTER — Ambulatory Visit (INDEPENDENT_AMBULATORY_CARE_PROVIDER_SITE_OTHER): Payer: BLUE CROSS/BLUE SHIELD | Admitting: Sports Medicine

## 2017-09-13 ENCOUNTER — Other Ambulatory Visit: Payer: Self-pay | Admitting: Sports Medicine

## 2017-09-13 ENCOUNTER — Encounter: Payer: Self-pay | Admitting: Sports Medicine

## 2017-09-13 DIAGNOSIS — E6609 Other obesity due to excess calories: Secondary | ICD-10-CM

## 2017-09-13 DIAGNOSIS — Z23 Encounter for immunization: Secondary | ICD-10-CM | POA: Diagnosis not present

## 2017-09-13 DIAGNOSIS — E782 Mixed hyperlipidemia: Secondary | ICD-10-CM | POA: Diagnosis not present

## 2017-09-13 DIAGNOSIS — E034 Atrophy of thyroid (acquired): Secondary | ICD-10-CM

## 2017-09-13 DIAGNOSIS — Z0000 Encounter for general adult medical examination without abnormal findings: Secondary | ICD-10-CM

## 2017-09-13 DIAGNOSIS — M722 Plantar fascial fibromatosis: Secondary | ICD-10-CM | POA: Diagnosis not present

## 2017-09-13 MED ORDER — TOPIRAMATE 50 MG PO TABS: 50.0000 mg | ORAL_TABLET | Freq: Two times a day (BID) | ORAL | 0 refills | Status: DC

## 2017-09-13 MED ORDER — PHENTERMINE HCL 37.5 MG PO TABS: ORAL_TABLET | ORAL | 0 refills | Status: DC

## 2017-09-13 NOTE — Assessment & Plan Note (Signed)
10 lb weight loss.

## 2017-09-13 NOTE — Progress Notes (Signed)
    Patient was fitted for a : standard, cushioned, semi-rigid orthotic. The orthotic was heated and afterward the patient stood on the orthotic blank positioned on the orthotic stand. The patient was positioned in subtalar neutral position and 10 degrees of ankle dorsiflexion in a weight bearing stance. After completion of molding, a stable base was applied to the orthotic blank. The blank was ground to a stable position for weight bearing. Size: 9 Base: White EVA Additional Posting and Padding: None The patient ambulated these, and they were very comfortable.  I spent 40 minutes with this patient, greater than 50% was face-to-face time counseling regarding the below diagnosis.  ___________________________________________ Dailee Manalang J. Abbeygail Igoe, M.D., ABFM., CAQSM. Primary Care and Sports Medicine [facility]  Adjunct Instructor of Family Medicine  [employer]   

## 2017-09-13 NOTE — Assessment & Plan Note (Signed)
Custom orthotics as above, if insufficient relief we will do an injection.

## 2017-09-13 NOTE — Assessment & Plan Note (Signed)
Referral for cervical cancer screening.

## 2017-09-14 ENCOUNTER — Other Ambulatory Visit: Payer: Self-pay | Admitting: Sports Medicine

## 2017-09-14 DIAGNOSIS — E6609 Other obesity due to excess calories: Secondary | ICD-10-CM

## 2017-09-14 LAB — LIPID PANEL W/REFLEX DIRECT LDL
Cholesterol: 174 mg/dL (ref <200)
HDL: 41 mg/dL — ABNORMAL LOW (ref >50)
LDL Cholesterol (Calc): 113 mg/dL (calc) — ABNORMAL HIGH
Non-HDL Cholesterol (Calc): 133 mg/dL — ABNORMAL HIGH (ref <130)
Total CHOL/HDL Ratio: 4.2 (calc) (ref <5.0)
Triglycerides: 101 mg/dL (ref <150)

## 2017-09-14 LAB — HEMOGLOBIN A1C
Hgb A1c MFr Bld: 5.7 %{Hb} — ABNORMAL HIGH (ref <5.7)
Mean Plasma Glucose: 117 (calc)
eAG (mmol/L): 6.5 (calc)

## 2017-09-14 LAB — VITAMIN B12: Vitamin B-12: 348 pg/mL (ref 200–1100)

## 2017-09-14 LAB — VITAMIN D 25 HYDROXY (VIT D DEFICIENCY, FRACTURES): Vit D, 25-Hydroxy: 30 ng/mL (ref 30–100)

## 2017-09-22 ENCOUNTER — Other Ambulatory Visit: Payer: Self-pay | Admitting: Sports Medicine

## 2017-09-22 DIAGNOSIS — F411 Generalized anxiety disorder: Secondary | ICD-10-CM

## 2017-10-08 ENCOUNTER — Ambulatory Visit: Payer: BLUE CROSS/BLUE SHIELD | Admitting: Sports Medicine

## 2017-11-03 ENCOUNTER — Telehealth: Payer: Self-pay | Admitting: Sports Medicine

## 2017-11-03 NOTE — Telephone Encounter (Signed)
Pt informed of provider's note. No other inquiries asked during phone call.

## 2017-11-03 NOTE — Telephone Encounter (Signed)
Pt called clinic stating she wants to stop the topamax. She is taking this for weight loss, currently taking one tab at night only. She has already stopped the phentermine. Questions if she can just stop or if she needs to taper down. If she needs to taper, she will need a new Rx. She only have 2 tabs left. Will route.   Pt does have upcoming appt with PCP already scheduled.

## 2017-11-03 NOTE — Telephone Encounter (Signed)
Should be fine to just stop it, any problems let us know. Will take this off her list

## 2017-11-15 ENCOUNTER — Other Ambulatory Visit: Payer: Self-pay

## 2017-11-15 ENCOUNTER — Ambulatory Visit: Payer: BLUE CROSS/BLUE SHIELD | Admitting: Sports Medicine

## 2017-11-15 DIAGNOSIS — E034 Atrophy of thyroid (acquired): Secondary | ICD-10-CM

## 2017-11-15 MED ORDER — LEVOTHYROXINE SODIUM 150 MCG PO TABS: ORAL_TABLET | ORAL | 3 refills | Status: DC

## 2017-11-24 ENCOUNTER — Ambulatory Visit (INDEPENDENT_AMBULATORY_CARE_PROVIDER_SITE_OTHER): Payer: BLUE CROSS/BLUE SHIELD | Admitting: Obstetrics & Gynecology

## 2017-11-24 ENCOUNTER — Encounter: Payer: Self-pay | Admitting: Obstetrics & Gynecology

## 2017-11-24 ENCOUNTER — Encounter (INDEPENDENT_AMBULATORY_CARE_PROVIDER_SITE_OTHER): Payer: Self-pay

## 2017-11-24 VITALS — BP 142/82 | HR 77 | Ht 69.0 in | Wt 286.0 lb

## 2017-11-24 DIAGNOSIS — Z0000 Encounter for general adult medical examination without abnormal findings: Secondary | ICD-10-CM

## 2017-11-24 DIAGNOSIS — R102 Pelvic and perineal pain: Secondary | ICD-10-CM | POA: Insufficient documentation

## 2017-11-24 DIAGNOSIS — Z01419 Encounter for gynecological examination (general) (routine) without abnormal findings: Secondary | ICD-10-CM

## 2017-11-24 NOTE — Progress Notes (Signed)
Subjective:     Ann Greene is a 52 y.o. female here for a routine exam.  Current complaints: pelvic pain, history of endometriosis, PCOS, dysmenorrhea.  Pt worried about getting cancer due to PCOS.  She has monthly menses with no spotting.  She has breast tenderness after ovulation.  Pt has been regular for several years.  Pt interested in Metformin for PCOS and weight loss.  Pt folowed by Dr. Karie Schwalbe for thyroid issues and primary care.    Gynecologic History Patient's last menstrual period was 10/12/2017 (approximate). Contraception: none Last Pap: 5 yrs ago--nml per pt.  Last mammogram: several years ago.  Nml per patient  Obstetric History OB History  Gravida Para Term Preterm AB Living  0 0 0 0 0 0  SAB TAB Ectopic Multiple Live Births  0 0 0 0 0         The following portions of the patient's history were reviewed and updated as appropriate: allergies, current medications, past family history, past medical history, past social history, past surgical history and problem list.  Review of Systems Pertinent items noted in HPI and remainder of comprehensive ROS otherwise negative.    Objective:      Vitals:   11/24/17 0928  BP: (!) 142/82  Pulse: 77  Weight: 286 lb (129.7 kg)  Height: 5\' 9"  (1.753 m)   Vitals:  WNL General appearance: alert, cooperative and no distress  HEENT: Normocephalic, without obvious abnormality, atraumatic Eyes: negative Throat: lips, mucosa, and tongue normal; teeth and gums normal  Respiratory: Clear to auscultation bilaterally  CV: Regular rate and rhythm  Breasts:  Normal appearance, no masses or tenderness, no nipple retraction or dimpling  GI: Soft, non-tender; bowel sounds normal; no masses,  no organomegaly  GU: External Genitalia:  Tanner V, no lesion Urethra:  No prolapse   Vagina: Pink, normal rugae, no blood or discharge  Cervix: No CMT, no lesion  Uterus:  Normal size and contour, non tender, limited by habitus  Adnexa: Normal, no  masses, non tender, limited by habitus  Musculoskeletal: No edema, redness or tenderness in the calves or thighs  Skin: No lesions or rash  Lymphatic: Axillary adenopathy: none     Psychiatric: Normal mood and behavior     Assessment:    Healthy female exam.   Obesity PCOS Pelvic Pain (?endometriosis)   Plan:   1.  Health Maint--Mammogram and pap smear with co testing; colonoscopy by age 52 2.  PCOS--menstruating regularly; however, is late this month; interestred in metformin which can help with weight loss with PCOS 3.  Check TSH, Vit D 4.  Weight loss--weight watchers, exercise, f.u with Dr. Karie Schwalbe 5.  Pelvic Pain--TVUS and Pelvic [place]

## 2017-11-25 LAB — COMPREHENSIVE METABOLIC PANEL
AG RATIO: 1.6 (calc) (ref 1.0–2.5)
ALT: 10 U/L (ref 6–29)
AST: 13 U/L (ref 10–35)
Albumin: 4.2 g/dL (ref 3.6–5.1)
Alkaline phosphatase (APISO): 64 U/L (ref 33–115)
BUN: 13 mg/dL (ref 7–25)
CO2: 26 mmol/L (ref 20–32)
CREATININE: 0.71 mg/dL (ref 0.50–1.10)
Calcium: 9.4 mg/dL (ref 8.6–10.2)
Chloride: 102 mmol/L (ref 98–110)
GLUCOSE: 100 mg/dL — AB (ref 65–99)
Globulin: 2.7 g/dL (calc) (ref 1.9–3.7)
Potassium: 4.5 mmol/L (ref 3.5–5.3)
Sodium: 137 mmol/L (ref 135–146)
TOTAL PROTEIN: 6.9 g/dL (ref 6.1–8.1)
Total Bilirubin: 0.5 mg/dL (ref 0.2–1.2)

## 2017-11-25 LAB — TSH: TSH: 0.81 mIU/L

## 2017-11-25 LAB — FOLLICLE STIMULATING HORMONE: FSH: 4.9 m[IU]/mL

## 2017-11-25 LAB — VITAMIN D 25 HYDROXY (VIT D DEFICIENCY, FRACTURES): VIT D 25 HYDROXY: 19 ng/mL — AB (ref 30–100)

## 2017-11-26 LAB — CYTOLOGY - PAP
Diagnosis: NEGATIVE
HPV: NOT DETECTED

## 2017-12-02 ENCOUNTER — Telehealth: Payer: Self-pay | Admitting: *Deleted

## 2017-12-02 NOTE — Telephone Encounter (Signed)
-----   Message from Lesly DukesKelly H Leggett, MD sent at 11/30/2017  1:37 PM EST ----- Normal pap smear.  Thyroid function is normal.  Creatinine is normal--can take metformin is desires.  Vitamin D is still low and needs supplementation.  RN to call and see if she wants to start metformin.

## 2017-12-02 NOTE — Telephone Encounter (Signed)
Pt notified of normal labs and she will start a natural Vitamin D3 supplement due to her low Vitamin D.  She states that she would like to start the Metformin as discussed with Dr Penne LashLeggett.  I will forward this to Dr Penne LashLeggett to get pt started on the meds.

## 2017-12-03 ENCOUNTER — Ambulatory Visit (HOSPITAL_COMMUNITY)
Admission: RE | Admit: 2017-12-03 | Discharge: 2017-12-03 | Disposition: A | Discharge status: Home / Self Care | Payer: BLUE CROSS/BLUE SHIELD | Source: Ambulatory Visit | Attending: Obstetrics & Gynecology | Admitting: Obstetrics & Gynecology

## 2017-12-03 DIAGNOSIS — Z0000 Encounter for general adult medical examination without abnormal findings: Secondary | ICD-10-CM | POA: Insufficient documentation

## 2017-12-03 DIAGNOSIS — E282 Polycystic ovarian syndrome: Secondary | ICD-10-CM | POA: Diagnosis not present

## 2017-12-05 ENCOUNTER — Other Ambulatory Visit: Payer: Self-pay | Admitting: Obstetrics & Gynecology

## 2017-12-05 DIAGNOSIS — E034 Atrophy of thyroid (acquired): Secondary | ICD-10-CM

## 2017-12-05 MED ORDER — VITAMIN D (ERGOCALCIFEROL) 1.25 MG (50000 UNIT) PO CAPS: 50000.0000 [IU] | ORAL_CAPSULE | ORAL | 0 refills | Status: DC

## 2017-12-05 MED ORDER — METFORMIN HCL ER 500 MG PO TB24: ORAL_TABLET | ORAL | 0 refills | Status: DC

## 2017-12-05 NOTE — Progress Notes (Signed)
Vitamin D re-prescribed.  Level is still low Metformin XR 500 mg at dinner for one week then increase to 1000 mg at dinner. Pt wants to try this to hep regulate her PCOS and help with weight loss.

## 2017-12-17 ENCOUNTER — Other Ambulatory Visit: Payer: Self-pay

## 2017-12-17 DIAGNOSIS — F411 Generalized anxiety disorder: Secondary | ICD-10-CM

## 2017-12-17 MED ORDER — DULOXETINE HCL 60 MG PO CPEP: 60.0000 mg | ORAL_CAPSULE | Freq: Every day | ORAL | 0 refills | Status: DC

## 2018-01-10 DIAGNOSIS — L570 Actinic keratosis: Secondary | ICD-10-CM | POA: Diagnosis not present

## 2018-01-10 DIAGNOSIS — L814 Other melanin hyperpigmentation: Secondary | ICD-10-CM | POA: Diagnosis not present

## 2018-01-10 DIAGNOSIS — L821 Other seborrheic keratosis: Secondary | ICD-10-CM | POA: Diagnosis not present

## 2018-01-10 DIAGNOSIS — D225 Melanocytic nevi of trunk: Secondary | ICD-10-CM | POA: Diagnosis not present

## 2018-01-10 DIAGNOSIS — D1801 Hemangioma of skin and subcutaneous tissue: Secondary | ICD-10-CM | POA: Diagnosis not present

## 2018-02-11 ENCOUNTER — Other Ambulatory Visit: Payer: Self-pay | Admitting: Sports Medicine

## 2018-02-11 DIAGNOSIS — E034 Atrophy of thyroid (acquired): Secondary | ICD-10-CM

## 2018-03-12 ENCOUNTER — Other Ambulatory Visit: Payer: Self-pay | Admitting: Sports Medicine

## 2018-03-12 DIAGNOSIS — F411 Generalized anxiety disorder: Secondary | ICD-10-CM

## 2018-05-08 ENCOUNTER — Other Ambulatory Visit: Payer: Self-pay | Admitting: Sports Medicine

## 2018-05-08 DIAGNOSIS — E034 Atrophy of thyroid (acquired): Secondary | ICD-10-CM

## 2018-06-15 ENCOUNTER — Other Ambulatory Visit: Payer: Self-pay | Admitting: Sports Medicine

## 2018-06-15 DIAGNOSIS — F411 Generalized anxiety disorder: Secondary | ICD-10-CM

## 2018-07-20 ENCOUNTER — Ambulatory Visit (INDEPENDENT_AMBULATORY_CARE_PROVIDER_SITE_OTHER): Payer: BLUE CROSS/BLUE SHIELD

## 2018-07-20 DIAGNOSIS — Z1231 Encounter for screening mammogram for malignant neoplasm of breast: Secondary | ICD-10-CM | POA: Diagnosis not present

## 2018-07-20 DIAGNOSIS — Z0000 Encounter for general adult medical examination without abnormal findings: Secondary | ICD-10-CM

## 2018-08-02 ENCOUNTER — Other Ambulatory Visit: Payer: Self-pay | Admitting: Sports Medicine

## 2018-08-02 DIAGNOSIS — E034 Atrophy of thyroid (acquired): Secondary | ICD-10-CM

## 2018-09-13 ENCOUNTER — Other Ambulatory Visit: Payer: Self-pay | Admitting: Sports Medicine

## 2018-09-13 DIAGNOSIS — F411 Generalized anxiety disorder: Secondary | ICD-10-CM

## 2018-10-30 ENCOUNTER — Other Ambulatory Visit: Payer: Self-pay | Admitting: Sports Medicine

## 2018-10-30 DIAGNOSIS — E034 Atrophy of thyroid (acquired): Secondary | ICD-10-CM

## 2018-11-10 IMAGING — MG DIGITAL SCREENING BILATERAL MAMMOGRAM WITH TOMO AND CAD
6 of 9 series · 6 of 21 positions shown · non-contrast
Comparison: None.

CLINICAL DATA: Screening.

EXAM:
DIGITAL SCREENING BILATERAL MAMMOGRAM WITH TOMO AND CAD

[L CC synth-2D]
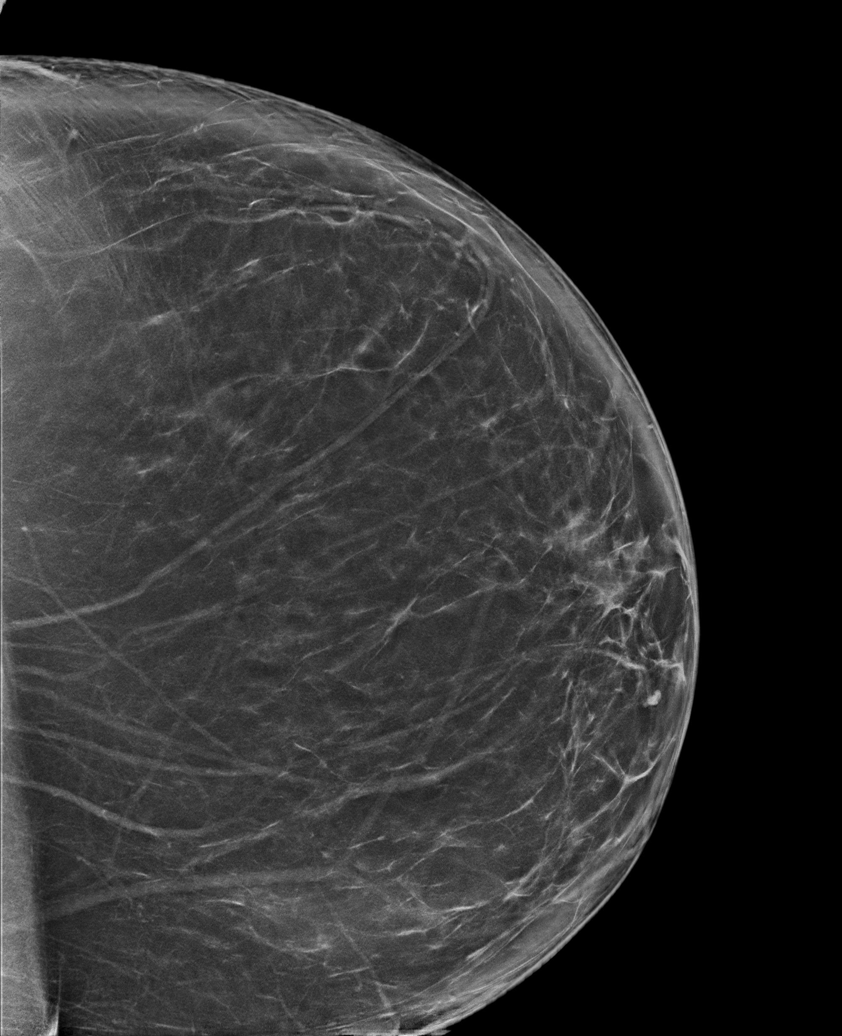

[R MLO synth-2D]
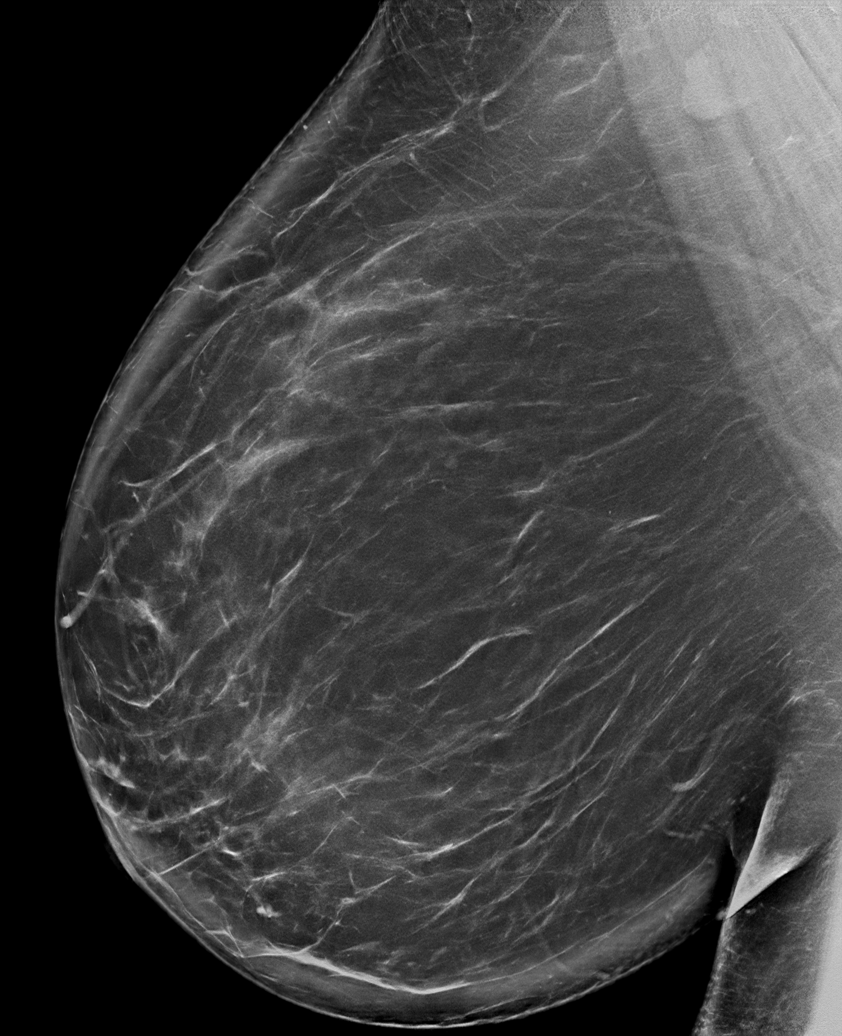

[R XCCL synth-2D]
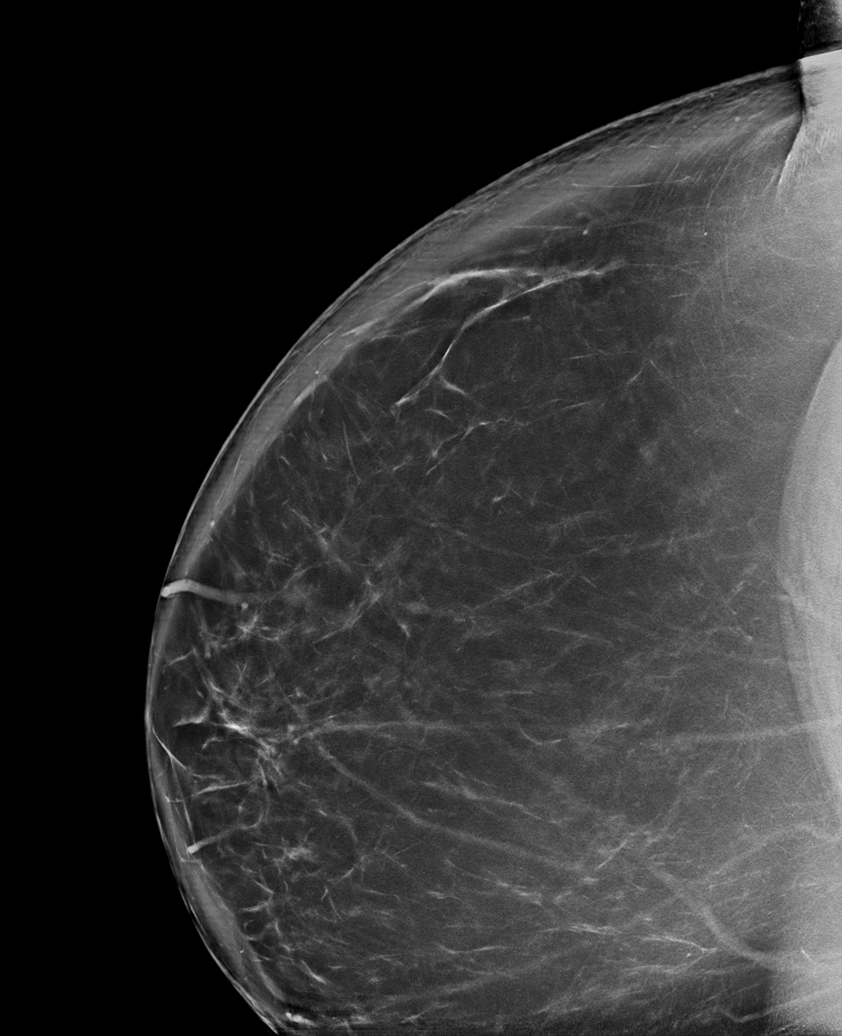

[L MLO synth-2D]
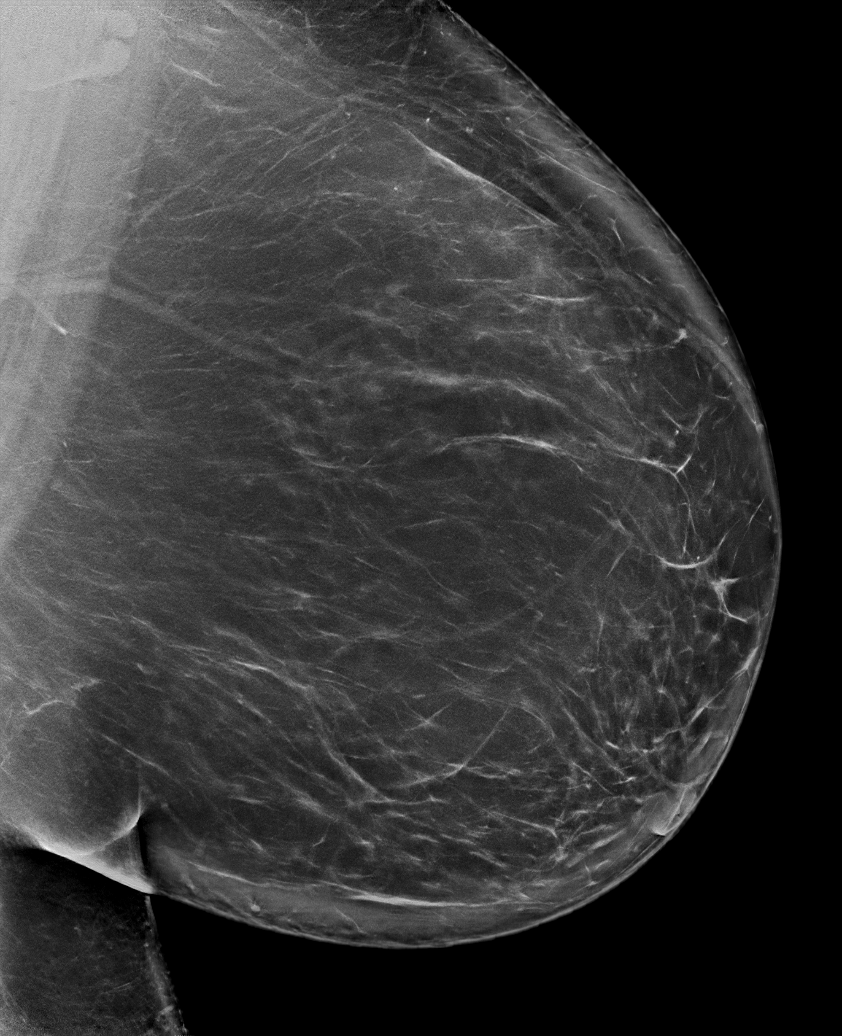

[R CC synth-2D]
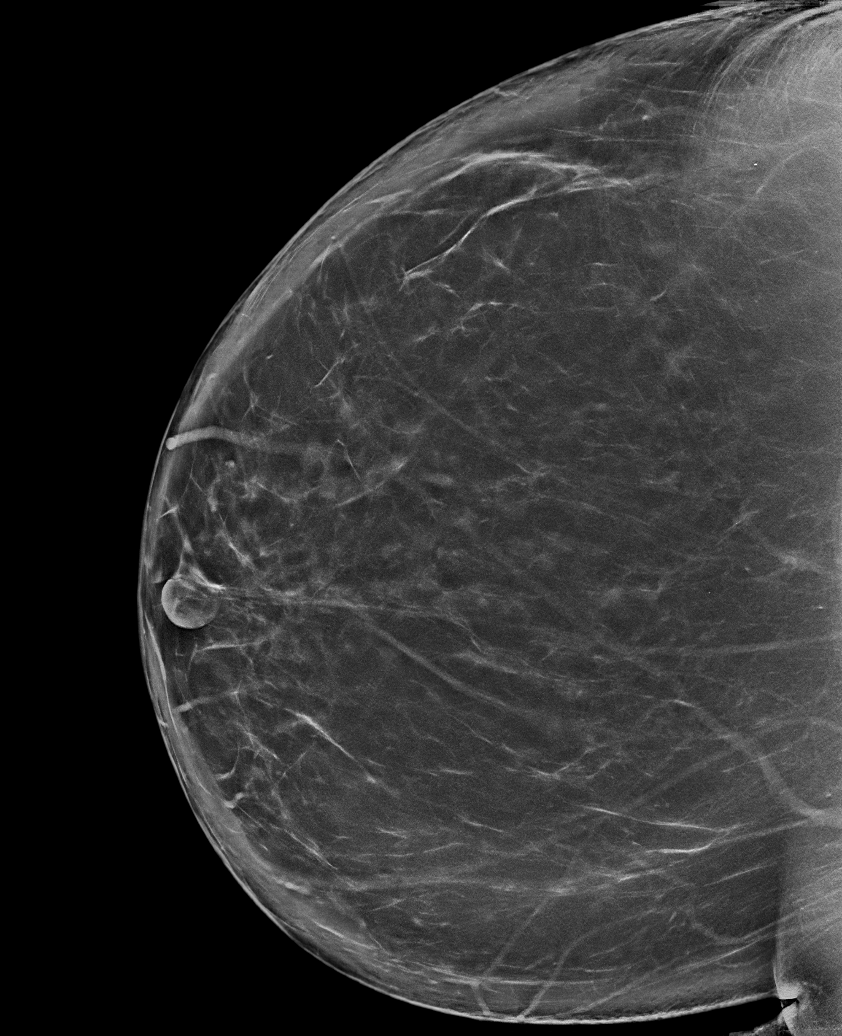

[L CV synth-2D]
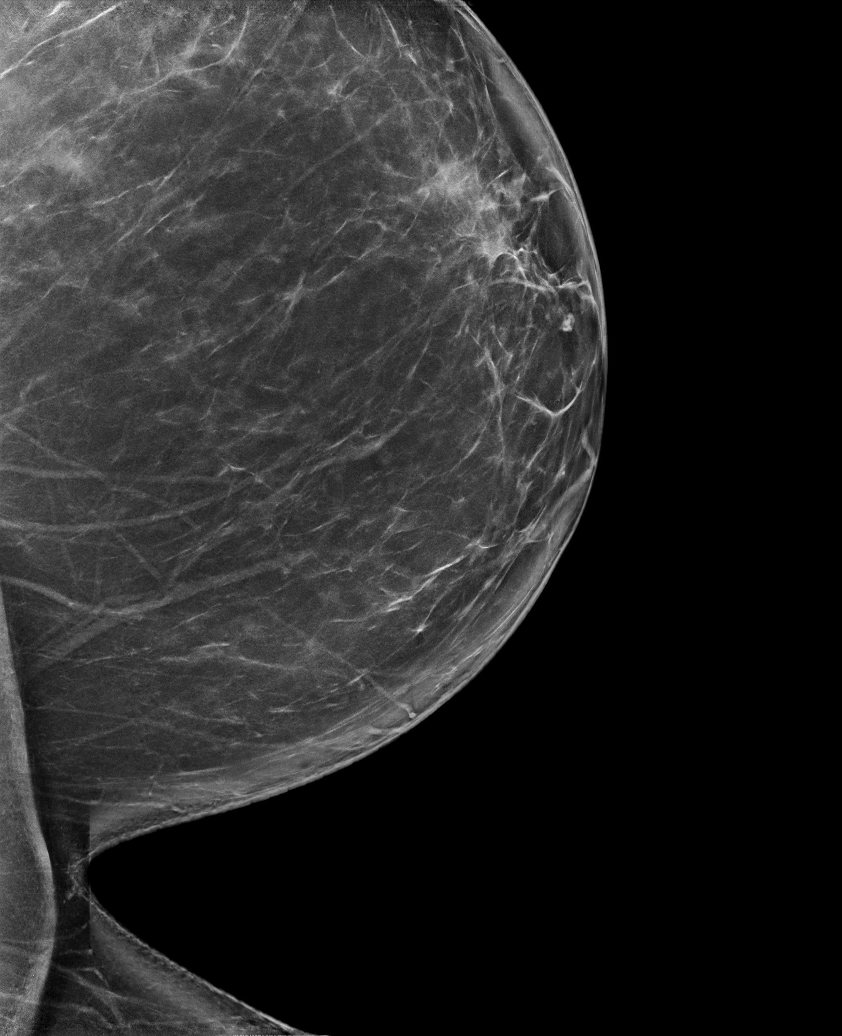

[6 of 21 positions shown; findings below may reference images not displayed]

ACR Breast Density Category b: There are scattered areas of
fibroglandular density.
FINDINGS: There are no findings suspicious for malignancy. Images were
processed with CAD.
IMPRESSION: No mammographic evidence of malignancy. A result letter of this
screening mammogram will be mailed directly to the patient.

RECOMMENDATION:
Screening mammogram in one year. (Code:Y5-G-EJ6)

BI-RADS CATEGORY  1: Negative.

## 2018-12-14 ENCOUNTER — Other Ambulatory Visit: Payer: Self-pay | Admitting: Sports Medicine

## 2018-12-14 DIAGNOSIS — F411 Generalized anxiety disorder: Secondary | ICD-10-CM

## 2019-01-20 DIAGNOSIS — D229 Melanocytic nevi, unspecified: Secondary | ICD-10-CM | POA: Diagnosis not present

## 2019-01-20 DIAGNOSIS — C44712 Basal cell carcinoma of skin of right lower limb, including hip: Secondary | ICD-10-CM | POA: Diagnosis not present

## 2019-01-20 DIAGNOSIS — L814 Other melanin hyperpigmentation: Secondary | ICD-10-CM | POA: Diagnosis not present

## 2019-01-20 DIAGNOSIS — D225 Melanocytic nevi of trunk: Secondary | ICD-10-CM | POA: Diagnosis not present

## 2019-01-20 DIAGNOSIS — D1801 Hemangioma of skin and subcutaneous tissue: Secondary | ICD-10-CM | POA: Diagnosis not present

## 2019-01-20 DIAGNOSIS — D485 Neoplasm of uncertain behavior of skin: Secondary | ICD-10-CM | POA: Diagnosis not present

## 2019-01-28 ENCOUNTER — Other Ambulatory Visit: Payer: Self-pay | Admitting: Sports Medicine

## 2019-01-28 DIAGNOSIS — E034 Atrophy of thyroid (acquired): Secondary | ICD-10-CM

## 2019-02-01 DIAGNOSIS — C44712 Basal cell carcinoma of skin of right lower limb, including hip: Secondary | ICD-10-CM | POA: Diagnosis not present

## 2019-03-14 ENCOUNTER — Encounter: Payer: Self-pay | Admitting: Sports Medicine

## 2019-03-14 ENCOUNTER — Other Ambulatory Visit: Payer: Self-pay | Admitting: Sports Medicine

## 2019-03-14 ENCOUNTER — Ambulatory Visit (INDEPENDENT_AMBULATORY_CARE_PROVIDER_SITE_OTHER): Payer: BLUE CROSS/BLUE SHIELD | Admitting: Sports Medicine

## 2019-03-14 DIAGNOSIS — G4733 Obstructive sleep apnea (adult) (pediatric): Secondary | ICD-10-CM | POA: Insufficient documentation

## 2019-03-14 DIAGNOSIS — F411 Generalized anxiety disorder: Secondary | ICD-10-CM

## 2019-03-14 DIAGNOSIS — E6609 Other obesity due to excess calories: Secondary | ICD-10-CM

## 2019-03-14 DIAGNOSIS — F909 Attention-deficit hyperactivity disorder, unspecified type: Secondary | ICD-10-CM | POA: Diagnosis not present

## 2019-03-14 DIAGNOSIS — G4719 Other hypersomnia: Secondary | ICD-10-CM | POA: Diagnosis not present

## 2019-03-14 DIAGNOSIS — E034 Atrophy of thyroid (acquired): Secondary | ICD-10-CM

## 2019-03-14 MED ORDER — LISDEXAMFETAMINE DIMESYLATE 20 MG PO CAPS: 20.0000 mg | ORAL_CAPSULE | Freq: Every day | ORAL | 0 refills | Status: DC

## 2019-03-14 MED ORDER — DULOXETINE HCL 60 MG PO CPEP: 60.0000 mg | ORAL_CAPSULE | Freq: Every day | ORAL | 1 refills | Status: DC

## 2019-03-14 NOTE — Assessment & Plan Note (Signed)
Diagnosed as a child, significant inattention, interested in trying Vyvanse. We will start low-dose Vyvanse and increase monthly until an adequate dose is found.

## 2019-03-14 NOTE — Assessment & Plan Note (Signed)
Anxiety is controlled, still has some depressive symptoms, was curious about getting off of Cymbalta, I do not think this is a good idea. Refilling.

## 2019-03-14 NOTE — Telephone Encounter (Signed)
I have not seen this patient since 2018, she needs a visit

## 2019-03-14 NOTE — Telephone Encounter (Signed)
Please contact Pt to schedule, thank you.  

## 2019-03-14 NOTE — Assessment & Plan Note (Signed)
Adding a home sleep study 

## 2019-03-14 NOTE — Progress Notes (Addendum)
Virtual Visit via WebEx/MyChart   I connected with  Ann Greene  on 03/17/19 via WebEx/MyChart/Doximity Video and verified that I am speaking with the correct person using two identifiers.   I discussed the limitations, risks, security and privacy concerns of performing an evaluation and management service by WebEx/MyChart/Doximity Video, including the higher likelihood of inaccurate diagnosis and treatment, and the availability of in person appointments.  We also discussed the likely need of an additional face to face encounter for complete and high quality delivery of care.  I also discussed with the patient that there may be a patient responsible charge related to this service. The patient expressed understanding and wishes to proceed.  Provider location is either at home or medical facility. Patient location is at their home, different from provider location. People involved in care of the patient during this telehealth encounter were myself, my nurse/medical assistant, and my front office/scheduling team member.  Subjective:    CC: Several issues  HPI: Depression: Has been stable for some time now on Cymbalta, anxiety is well controlled but she still has some depressive symptoms.  She is also been complaining of inattention, was diagnosed with ADHD at a younger age, was wondering if an ADHD medicine would be helpful and useful here.  No suicidal or homicidal ideation.  Excessive daytime sleepiness: With significant snoring, has never been tested for sleep apnea.  I reviewed the past medical history, family history, social history, surgical history, and allergies today and no changes were needed.  Please see the problem list section below in epic for further details.  Past Medical History: Past Medical History:  Diagnosis Date   Thyroid disease    Past Surgical History: Past Surgical History:  Procedure Laterality Date   CHOLECYSTECTOMY  40   TONSILLECTOMY     Social  History: Social History   Socioeconomic History   Marital status: Married    Spouse name: Not on file   Number of children: Not on file   Years of education: Not on file   Highest education level: Not on file  Occupational History   Not on file  Social Needs   Financial resource strain: Not on file   Food insecurity:    Worry: Not on file    Inability: Not on file   Transportation needs:    Medical: Not on file    Non-medical: Not on file  Tobacco Use   Smoking status: Never Smoker   Smokeless tobacco: Never Used  Substance and Sexual Activity   Alcohol use: Yes    Alcohol/week: 0.0 standard drinks    Comment: occasional   Drug use: No   Sexual activity: Yes  Lifestyle   Physical activity:    Days per week: Not on file    Minutes per session: Not on file   Stress: Not on file  Relationships   Social connections:    Talks on phone: Not on file    Gets together: Not on file    Attends religious service: Not on file    Active member of club or organization: Not on file    Attends meetings of clubs or organizations: Not on file    Relationship status: Not on file  Other Topics Concern   Not on file  Social History Narrative   Not on file   Family History: Family History  Problem Relation Age of Onset   Alcohol abuse Father    Heart attack Father    Alcohol abuse  Brother    Cancer Brother        testicular   Suicidality Brother    Alcohol abuse Paternal Uncle    Cancer Paternal Uncle        colon   Heart attack Maternal Grandmother    Allergies: No Known Allergies Medications: See med rec.  Review of Systems: No fevers, chills, night sweats, weight loss, chest pain, or shortness of breath.   Objective:    General: Speaking full sentences, no audible heavy breathing.  Sounds alert and appropriately interactive.  Appears well.  Face symmetric.  Extraocular movements intact.  Pupils equal and round.  No nasal flaring or accessory  muscle use visualized.  No other physical exam performed due to the non-physical nature of this visit.  Impression and Recommendations:    Generalized anxiety disorder Anxiety is controlled, still has some depressive symptoms, was curious about getting off of Cymbalta, I do not think this is a good idea. Refilling.   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 We will start low-dose Vyvanse and increase monthly until an adequate dose is found.  Obesity We will discuss weight loss medications such as Ozempic at the follow-up visit.  Excessive daytime sleepiness Adding a home sleep study.  Hypothyroidism TSH is high, increasing levothyroxine to 175 mcg.   Recheck in 6 weeks.    I discussed the above assessment and treatment plan with the patient. The patient was provided an opportunity to ask questions and all were answered. The patient agreed with the plan and demonstrated an understanding of the instructions.   The patient was advised to call back or seek an in-person evaluation if the symptoms worsen or if the condition fails to improve as anticipated.   I provided 25 minutes of non-face-to-face time during this encounter, 15 minutes of additional time was needed to gather information, review chart, records, communicate/coordinate with staff remotely, troubleshooting the multiple errors that we get every time when trying to do video calls through the electronic medical record, WebEx, and Doximity, restart the encounter multiple times due to instability of the software, as well as complete documentation.   ___________________________________________ Ihor Austinhomas J. Benjamin Stainhekkekandam, M.D., ABFM., CAQSM. Primary Care and Sports Medicine [facility]  Adjunct Professor of Family Medicine  [employer]

## 2019-03-14 NOTE — Assessment & Plan Note (Signed)
We will discuss weight loss medications such as Ozempic at the follow-up visit.

## 2019-03-16 DIAGNOSIS — E034 Atrophy of thyroid (acquired): Secondary | ICD-10-CM | POA: Diagnosis not present

## 2019-03-17 LAB — CBC
HCT: 41 % (ref 35.0–45.0)
Hemoglobin: 13.4 g/dL (ref 11.7–15.5)
MCH: 27 pg (ref 27.0–33.0)
MCHC: 32.7 g/dL (ref 32.0–36.0)
MCV: 82.7 fL (ref 80.0–100.0)
MPV: 9.2 fL (ref 7.5–12.5)
Platelets: 417 10*3/uL — ABNORMAL HIGH (ref 140–400)
RBC: 4.96 10*6/uL (ref 3.80–5.10)
RDW: 13.5 % (ref 11.0–15.0)
WBC: 11.4 10*3/uL — ABNORMAL HIGH (ref 3.8–10.8)

## 2019-03-17 LAB — COMPLETE METABOLIC PANEL WITH GFR
AG Ratio: 1.7 (calc) (ref 1.0–2.5)
ALT: 15 U/L (ref 6–29)
AST: 13 U/L (ref 10–35)
Albumin: 4.3 g/dL (ref 3.6–5.1)
Alkaline phosphatase (APISO): 72 U/L (ref 37–153)
BUN: 12 mg/dL (ref 7–25)
CO2: 26 mmol/L (ref 20–32)
Calcium: 9.8 mg/dL (ref 8.6–10.4)
Chloride: 102 mmol/L (ref 98–110)
Creat: 0.74 mg/dL (ref 0.50–1.05)
GFR, Est African American: 109 mL/min/{1.73_m2} (ref >=60)
GFR, Est Non African American: 94 mL/min/{1.73_m2} (ref >=60)
Globulin: 2.5 g/dL (calc) (ref 1.9–3.7)
Glucose, Bld: 122 mg/dL — ABNORMAL HIGH (ref 65–99)
Potassium: 4.3 mmol/L (ref 3.5–5.3)
Sodium: 139 mmol/L (ref 135–146)
Total Bilirubin: 0.3 mg/dL (ref 0.2–1.2)
Total Protein: 6.8 g/dL (ref 6.1–8.1)

## 2019-03-17 LAB — LIPID PANEL W/REFLEX DIRECT LDL
Cholesterol: 210 mg/dL — ABNORMAL HIGH (ref <200)
HDL: 52 mg/dL (ref >=50)
LDL Cholesterol (Calc): 133 mg/dL (calc) — ABNORMAL HIGH
Non-HDL Cholesterol (Calc): 158 mg/dL (calc) — ABNORMAL HIGH (ref <130)
Total CHOL/HDL Ratio: 4 (calc) (ref <5.0)
Triglycerides: 135 mg/dL (ref <150)

## 2019-03-17 LAB — HEMOGLOBIN A1C
Hgb A1c MFr Bld: 5.9 % of total Hgb — ABNORMAL HIGH (ref <5.7)
Mean Plasma Glucose: 123 (calc)
eAG (mmol/L): 6.8 (calc)

## 2019-03-17 LAB — TSH: TSH: 4.52 mIU/L — ABNORMAL HIGH

## 2019-03-17 LAB — VITAMIN D 25 HYDROXY (VIT D DEFICIENCY, FRACTURES): Vit D, 25-Hydroxy: 18 ng/mL — ABNORMAL LOW (ref 30–100)

## 2019-03-17 MED ORDER — VITAMIN D (ERGOCALCIFEROL) 1.25 MG (50000 UNIT) PO CAPS: 50000.0000 [IU] | ORAL_CAPSULE | ORAL | 0 refills | Status: DC

## 2019-03-17 MED ORDER — LEVOTHYROXINE SODIUM 175 MCG PO TABS: ORAL_TABLET | ORAL | 3 refills | Status: DC

## 2019-03-17 NOTE — Assessment & Plan Note (Signed)
TSH is high, increasing levothyroxine to 175 mcg.   Recheck in 6 weeks.

## 2019-03-17 NOTE — Addendum Note (Signed)
Addended by: Monica Becton on: 03/17/2019 01:20 PM   Modules accepted: Orders

## 2019-04-13 ENCOUNTER — Other Ambulatory Visit: Payer: Self-pay | Admitting: *Deleted

## 2019-04-13 DIAGNOSIS — F909 Attention-deficit hyperactivity disorder, unspecified type: Secondary | ICD-10-CM

## 2019-04-13 MED ORDER — LISDEXAMFETAMINE DIMESYLATE 20 MG PO CAPS: 20.0000 mg | ORAL_CAPSULE | Freq: Every day | ORAL | 0 refills | Status: DC

## 2019-04-25 ENCOUNTER — Other Ambulatory Visit: Payer: Self-pay | Admitting: Sports Medicine

## 2019-04-25 DIAGNOSIS — E034 Atrophy of thyroid (acquired): Secondary | ICD-10-CM

## 2019-05-13 ENCOUNTER — Other Ambulatory Visit: Payer: Self-pay | Admitting: Sports Medicine

## 2019-05-17 ENCOUNTER — Other Ambulatory Visit: Payer: Self-pay | Admitting: *Deleted

## 2019-05-17 DIAGNOSIS — F909 Attention-deficit hyperactivity disorder, unspecified type: Secondary | ICD-10-CM

## 2019-05-17 MED ORDER — LISDEXAMFETAMINE DIMESYLATE 20 MG PO CAPS: 20.0000 mg | ORAL_CAPSULE | Freq: Every day | ORAL | 0 refills | Status: DC

## 2019-05-25 ENCOUNTER — Telehealth: Payer: Self-pay

## 2019-05-25 DIAGNOSIS — Z20822 Contact with and (suspected) exposure to covid-19: Secondary | ICD-10-CM

## 2019-05-25 DIAGNOSIS — Z20828 Contact with and (suspected) exposure to other viral communicable diseases: Secondary | ICD-10-CM

## 2019-05-25 NOTE — Telephone Encounter (Signed)
Patient called, she and her husband traveled to [place] this week and found out they may have been exposed to [place]. Currently no SX. But are traveling to visit elderly mother next weekend and want to make sure.   OK for testing? Pended

## 2019-05-26 NOTE — Telephone Encounter (Signed)
Yes that is fine, I would have them wait at least a full week after the exposure before getting tested and ideally self quarantine and consistently wear a mask in the meantime.  They can go to the testing center without an appointment between 8 and 3. 

## 2019-05-26 NOTE — Telephone Encounter (Signed)
Patient advised. Will wait 1 week post exposure for testing. Advised to quarantine while waiting to get tested and also after testing until receive results.

## 2019-05-29 ENCOUNTER — Other Ambulatory Visit: Payer: Self-pay

## 2019-05-29 DIAGNOSIS — R6889 Other general symptoms and signs: Secondary | ICD-10-CM | POA: Diagnosis not present

## 2019-05-29 DIAGNOSIS — Z20822 Contact with and (suspected) exposure to covid-19: Secondary | ICD-10-CM

## 2019-05-31 LAB — NOVEL CORONAVIRUS, NAA: SARS-CoV-2, NAA: NOT DETECTED

## 2019-06-06 ENCOUNTER — Ambulatory Visit (INDEPENDENT_AMBULATORY_CARE_PROVIDER_SITE_OTHER): Payer: BC Managed Care – PPO | Admitting: Sports Medicine

## 2019-06-06 ENCOUNTER — Encounter: Payer: Self-pay | Admitting: Sports Medicine

## 2019-06-06 DIAGNOSIS — R109 Unspecified abdominal pain: Secondary | ICD-10-CM | POA: Diagnosis not present

## 2019-06-06 DIAGNOSIS — E034 Atrophy of thyroid (acquired): Secondary | ICD-10-CM

## 2019-06-06 DIAGNOSIS — M1711 Unilateral primary osteoarthritis, right knee: Secondary | ICD-10-CM | POA: Diagnosis not present

## 2019-06-06 NOTE — Assessment & Plan Note (Signed)
Known knee osteoarthritis. At this point referring to formal physical therapy. She has some hip pain as well,If no better after 4 to 6 weeks of therapy I do need to see her in the office to make a definitive diagnosis of her hip pain.

## 2019-06-06 NOTE — Assessment & Plan Note (Signed)
Unclear etiology, patient understands limitations of managing this on a virtual visit. She did a good Rx virtual visit and they started antibiotics for presumed UTI, she was having some suprapubic pressure, dysuria, now has right flank pain, severe.  Low-grade fevers. I am going to add some labs for this, the urinalysis is likely routine because she has been on antibiotics already. When she finishes her antibiotics I would likely need to see her in person.

## 2019-06-06 NOTE — Assessment & Plan Note (Signed)
TSH was high back in May, I increased levothyroxine to 175 mcg, we are going to recheck this as well.

## 2019-06-06 NOTE — Progress Notes (Signed)
Virtual Visit via WebEx/MyChart   I connected with  Ann Greene  on 06/06/19 via WebEx/MyChart/Doximity Video and verified that I am speaking with the correct person using two identifiers.   I discussed the limitations, risks, security and privacy concerns of performing an evaluation and management service by WebEx/MyChart/Doximity Video, including the higher likelihood of inaccurate diagnosis and treatment, and the availability of in person appointments.  We also discussed the likely need of an additional face to face encounter for complete and high quality delivery of care.  I also discussed with the patient that there may be a patient responsible charge related to this service. The patient expressed understanding and wishes to proceed.  Provider location is either at home or medical facility. Patient location is at their home, different from provider location. People involved in care of the patient during this telehealth encounter were myself, my nurse/medical assistant, and my front office/scheduling team member.  Subjective:    CC: Abdominal pain  HPI: Ann Greene is a pleasant 53 year old female, for the past several days she has had suprapubic pressure, dysuria, ultimately right costovertebral angle pain.  No overt hematuria.  She did a virtual visit, was prescribed Keflex, she is on day 2 of her Keflex, possibly start to feel a bit better but does endorse low-grade fevers, as well as achiness and myalgias.  In addition she has knee osteoarthritis, would like to try some physical therapy before considering interventional treatment.  I reviewed the past medical history, family history, social history, surgical history, and allergies today and no changes were needed.  Please see the problem list section below in epic for further details.  Past Medical History: Past Medical History:  Diagnosis Date  . Thyroid disease    Past Surgical History: Past Surgical History:  Procedure Laterality  Date  . CHOLECYSTECTOMY  1996  . TONSILLECTOMY     Social History: Social History   Socioeconomic History  . Marital status: Married    Spouse name: Not on file  . Number of children: Not on file  . Years of education: Not on file  . Highest education level: Not on file  Occupational History  . Not on file  Social Needs  . Financial resource strain: Not on file  . Food insecurity    Worry: Not on file    Inability: Not on file  . Transportation needs    Medical: Not on file    Non-medical: Not on file  Tobacco Use  . Smoking status: Never Smoker  . Smokeless tobacco: Never Used  Substance and Sexual Activity  . Alcohol use: Yes    Alcohol/week: 0.0 standard drinks    Comment: occasional  . Drug use: No  . Sexual activity: Yes  Lifestyle  . Physical activity    Days per week: Not on file    Minutes per session: Not on file  . Stress: Not on file  Relationships  . Social Herbalist on phone: Not on file    Gets together: Not on file    Attends religious service: Not on file    Active member of club or organization: Not on file    Attends meetings of clubs or organizations: Not on file    Relationship status: Not on file  Other Topics Concern  . Not on file  Social History Narrative  . Not on file   Family History: Family History  Problem Relation Age of Onset  . Alcohol abuse Father   .  Heart attack Father   . Alcohol abuse Brother   . Cancer Brother        testicular  . Suicidality Brother   . Alcohol abuse Paternal Uncle   . Cancer Paternal Uncle        colon  . Heart attack Maternal Grandmother    Allergies: No Known Allergies Medications: See med rec.  Review of Systems: No fevers, chills, night sweats, weight loss, chest pain, or shortness of breath.   Objective:    General: Speaking full sentences, no audible heavy breathing.  Sounds alert and appropriately interactive.  Appears well.  Face symmetric.  Extraocular movements intact.   Pupils equal and round.  No nasal flaring or accessory muscle use visualized.  No other physical exam performed due to the non-physical nature of this visit.  Impression and Recommendations:    Acute abdominal pain Unclear etiology, patient understands limitations of managing this on a virtual visit. She did a good Rx virtual visit and they started antibiotics for presumed UTI, she was having some suprapubic pressure, dysuria, now has right flank pain, severe.  Low-grade fevers. I am going to add some labs for this, the urinalysis is likely routine because she has been on antibiotics already. When she finishes her antibiotics I would likely need to see her in person.  Primary osteoarthritis of right knee Known knee osteoarthritis. At this point referring to formal physical therapy. She has some hip pain as well,If no better after 4 to 6 weeks of therapy I do need to see her in the office to make a definitive diagnosis of her hip pain.  Hypothyroidism TSH was high back in May, I increased levothyroxine to 175 mcg, we are going to recheck this as well.  I discussed the above assessment and treatment plan with the patient. The patient was provided an opportunity to ask questions and all were answered. The patient agreed with the plan and demonstrated an understanding of the instructions.   The patient was advised to call back or seek an in-person evaluation if the symptoms worsen or if the condition fails to improve as anticipated.   I provided 25 minutes of non-face-to-face time during this encounter, 15 minutes of additional time was needed to gather information, review chart, records, communicate/coordinate with staff remotely, troubleshooting the multiple errors that we get every time when trying to do video calls through the electronic medical record, WebEx, and Doximity, restart the encounter multiple times due to instability of the software, as well as complete documentation.    ___________________________________________ Ihor Austinhomas J. Benjamin Stainhekkekandam, M.D., ABFM., CAQSM. Primary Care and Sports Medicine [facility]  Adjunct Professor of Family Medicine  [employer]

## 2019-06-07 DIAGNOSIS — E034 Atrophy of thyroid (acquired): Secondary | ICD-10-CM | POA: Diagnosis not present

## 2019-06-07 DIAGNOSIS — R109 Unspecified abdominal pain: Secondary | ICD-10-CM | POA: Diagnosis not present

## 2019-06-08 ENCOUNTER — Encounter: Payer: Self-pay | Admitting: Physical Therapy

## 2019-06-08 ENCOUNTER — Other Ambulatory Visit: Payer: Self-pay

## 2019-06-08 ENCOUNTER — Ambulatory Visit (INDEPENDENT_AMBULATORY_CARE_PROVIDER_SITE_OTHER): Payer: BC Managed Care – PPO | Admitting: Physical Therapy

## 2019-06-08 DIAGNOSIS — M546 Pain in thoracic spine: Secondary | ICD-10-CM

## 2019-06-08 DIAGNOSIS — M25661 Stiffness of right knee, not elsewhere classified: Secondary | ICD-10-CM | POA: Diagnosis not present

## 2019-06-08 DIAGNOSIS — G8929 Other chronic pain: Secondary | ICD-10-CM

## 2019-06-08 DIAGNOSIS — M25551 Pain in right hip: Secondary | ICD-10-CM | POA: Diagnosis not present

## 2019-06-08 DIAGNOSIS — M25561 Pain in right knee: Secondary | ICD-10-CM

## 2019-06-08 DIAGNOSIS — R262 Difficulty in walking, not elsewhere classified: Secondary | ICD-10-CM

## 2019-06-08 NOTE — Therapy (Signed)
[facility] [address] [place], [place], [address] Phone: [phone]   Fax:  [phone]  Physical Therapy Evaluation  Patient Details  Name: Ann Greene MRN: [id number] Date of Birth: 12/16/1965 Referring Provider (PT): Rodney Langtonhekkekandam, Thomas, MD   Encounter Date: 06/08/2019  PT End of Session - 06/08/19 [id number]    Visit Number  1    Number of Visits  16    Date for PT Re-Evaluation  08/08/19    Authorization Type  BCBS    PT Start Time  0808    PT Stop Time  0849    PT Time Calculation (min)  41 min    Activity Tolerance  Patient tolerated treatment well    Behavior During Therapy  [facility] for tasks assessed/performed       Past Medical History:  Diagnosis Date  . Thyroid disease     Past Surgical History:  Procedure Laterality Date  . CHOLECYSTECTOMY  1996  . TONSILLECTOMY      There were no vitals filed for this visit.   Subjective Assessment - 06/08/19 0813    Subjective  Pt arriving to therapy 8 minutes late. Pt reporting pain in R knee, R hip and thoracic spine. Pt reporting her R knee pain has been going on for almost 8 - 10 years. Pt also reporting she is being treated for UTI with antibiotics. Pt with h/o ankle fx years ago with multiple fractures. Pt reporting 6/10 at rest, walking her pain increases to 10/10. Pt reporting hip pain of 7/10 which gets worse with sitting prolonged.    Pertinent History  obesity, anxiety, ADHD, thyroid disease, plantar fascitis,    Limitations  Sitting;Walking;Standing    How long can you sit comfortably?  10 minutes    How long can you stand comfortably?  5 minutes    How long can you walk comfortably?  it always hurts    Diagnostic tests  X-ray 2018    Patient Stated Goals  I want to be able to walk and workout without hurting    Currently in Pain?  Yes    Pain Score  6     Pain Location  Knee    Pain Orientation  Right    Pain Descriptors / Indicators  Aching    Pain  Type  Chronic pain    Pain Onset  More than a month ago    Pain Frequency  Constant    Aggravating Factors   standing, walking    Pain Relieving Factors  resting, ice    Effect of Pain on Daily Activities  difficulty bending down to pick up things off the floor, Pt requires assistance donning R shoe    Multiple Pain Sites  Yes    Pain Score  7    Pain Location  Hip    Pain Orientation  Right    Pain Descriptors / Indicators  Aching    Pain Type  Chronic pain    Pain Onset  More than a month ago    Pain Frequency  Constant    Aggravating Factors   standing, walking    Pain Relieving Factors  resting, chaning positions    Effect of Pain on Daily Activities  difficulty with walking, standing to do houshold chores, bending are all difficult         [facility] - 06/08/19 0001      Assessment   Medical Diagnosis  M17.11 - OA R knee,  pain in R hip and thoracic spine    Referring Provider (PT)  Rodney Langtonhekkekandam, Thomas, MD    Hand Dominance  Right    Next MD Visit  f/u after therapy    Prior Therapy  knee pain years ago      Precautions   Precautions  None      Restrictions   Weight Bearing Restrictions  No      Balance Screen   Has the patient fallen in the past 6 months  No    Is the patient reluctant to leave their home because of a fear of falling?   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    Leisure  walk, garden, read      Cognition   Overall Cognitive Status  Within Functional Limits for tasks assessed      Observation/Other Assessments   Focus on Therapeutic Outcomes (FOTO)   51% limitation      ROM / Strength   AROM / PROM / Strength  AROM;Strength      AROM   AROM Assessment Site  Knee    Right/Left Knee  Right;Left    Right Knee Extension  0    Right Knee Flexion  112    Left Knee Extension  0    Left Knee Flexion  126       Strength   Strength Assessment Site  Hip;Knee    Right/Left Hip  Right;Left    Right Hip Flexion  4-/5    Right Hip ABduction  4-/5    Right Hip ADduction  4-/5    Left Hip Flexion  4/5    Left Hip Extension  4/5    Left Hip ABduction  4/5    Left Hip ADduction  4/5    Right/Left Knee  Right;Left    Right Knee Flexion  4+/5    Right Knee Extension  4+/5    Left Knee Flexion  5/5    Left Knee Extension  5/5      Flexibility   Soft Tissue Assessment /Muscle Length  yes    Hamstrings  R: 68 degrees, L: 72      Palpation   Palpation comment  TTP on R knee and R lateral hip      Ambulation/Gait   Gait Pattern  Step-through pattern;Decreased step length - right;Decreased stance time - right;Decreased hip/knee flexion - right;Poor foot clearance - left;Poor foot clearance - right                Objective measurements completed on examination: See above findings.      [facility] Adult PT Treatment/Exercise - 06/08/19 0001      Exercises   Exercises  Knee/Hip      Knee/Hip Exercises: Stretches   Active Hamstring Stretch  Both;2 reps;30 seconds      Knee/Hip Exercises: Supine   Bridges  Strengthening;Both;5 reps    Straight Leg Raises  Strengthening;Both;10 reps    Other Supine Knee/Hip Exercises  SKTC x 2 holding 30 seconds             PT Education - 06/08/19 0822    Education Details  HEP    Person(s) Educated  Patient    Methods  Explanation;Demonstration;Handout    Comprehension  Verbalized understanding;Returned demonstration;Verbal cues required  PT Long Term Goals - 06/08/19 1349      PT LONG TERM GOAL #1   Title  Pt will be independent in her HEP and progression.    Baseline  initial HEP issued 06/08/19    Time  8    Period  Weeks    Status  New    Target Date  08/03/19      PT LONG TERM GOAL #2   Title  Pt will be able to amb for 20 minutes with pain </= 2/10 in her hip and knee.    Baseline  pain can increase to 8-9/10 when  walking longer distances    Time  8    Period  Weeks    Status  New    Target Date  08/03/19      PT LONG TERM GOAL #3   Title  Pt will improve her FOTO score from 51% limitation to </= 39% limitation.    Baseline  51% limiation on 06/08/2019    Time  8    Period  Weeks    Status  New    Target Date  08/03/19      PT LONG TERM GOAL #4   Title  Pt will improve bialteral LE hip strength to >/4+/5 in order to improve funcitonal mobility.    Baseline  see flowsheets    Time  8    Period  Weeks    Status  New    Target Date  08/03/19      PT LONG TERM GOAL #5   Title  Pt wil be able to perform functional squat in order to pick up objects off the floor with pain </= 3/10.    Baseline  pain reported 8/10    Time  8    Period  Weeks    Status  New    Target Date  08/03/19             Plan - 06/08/19 1354    Clinical Impression Statement  Pt presenting today for PT evaluation reporting pain in R knee, R hip and low back. Pt reporting that her pain is chronic and has been progressively getting worse. Pt acknowleding that her weight could be a factor and pt states she wants to imrpove her knee pain in order to start exercising more. Pt with mild decrease in R knee strength compared to left with weakness noted in R hip. Pt with limited hamstring flexibility and limited tolerance to walking long distance. PT needed to address pt's impairments with the below interventiosn.    Personal Factors and Comorbidities  Comorbidity 2;Comorbidity 3+    Comorbidities  ADHD, anxiety, thyroid disease, obesity    Examination-Activity Limitations  Squat;Stairs;Stand    Examination-Participation Restrictions  Community Activity    Stability/Clinical Decision Making  Stable/Uncomplicated    Clinical Decision Making  Low    Rehab Potential  Good    PT Frequency  2x / week    PT Duration  8 weeks    PT Treatment/Interventions  Cryotherapy;Electrical Stimulation;Iontophoresis 4mg /ml Dexamethasone;Moist  Heat;Ultrasound;Gait training;Stair training;Balance training;Therapeutic exercise;Therapeutic activities;Functional mobility training;Neuromuscular re-education;Patient/family education;Passive range of motion;Dry needling;Taping;Manual techniques    PT Next Visit Plan  LE strengtheing/ stretching, STW and modalities as needed    PT Home Exercise Plan  SLR, SKTC, bridges, hamstring stretch    Consulted and Agree with Plan of Care  Patient       Patient will benefit from skilled therapeutic intervention in order  to improve the following deficits and impairments:  Pain, Decreased strength, Decreased activity tolerance, Decreased range of motion, Obesity, Difficulty walking  Visit Diagnosis: 1. Chronic pain of right knee   2. Pain in right hip   3. Stiffness of right knee, not elsewhere classified   4. Difficulty in walking, not elsewhere classified   5. Chronic midline low back pain without sciatica        Problem List Patient Active Problem List   Diagnosis Date Noted  . Acute abdominal pain 06/06/2019  . Adult ADHD 03/14/2019  . Excessive daytime sleepiness 03/14/2019  . Pelvic pain in female 11/24/2017  . Hyperlipidemia, mixed 08/16/2017  . Plantar fasciitis, right 07/07/2017  . Primary osteoarthritis of right knee 11/26/2016  . Obesity 01/30/2016  . Generalized anxiety disorder 12/05/2015  . Chest pain 12/05/2015  . Hypothyroidism 12/05/2015  . Positive ANA (antinuclear antibody) 12/05/2015    Sharmon LeydenJennifer R  ,PT 06/08/2019, 2:00 PM  [facility] [address] [place], [place], [address] Phone: [phone]   Fax:  [phone]  Name: Ann Greene MRN: [id number] Date of Birth: 06/06/1966

## 2019-06-09 LAB — URINALYSIS W MICROSCOPIC + REFLEX CULTURE
Bacteria, UA: NONE SEEN /HPF
Bilirubin Urine: NEGATIVE
Glucose, UA: NEGATIVE
Hgb urine dipstick: NEGATIVE
Hyaline Cast: NONE SEEN /LPF
Ketones, ur: NEGATIVE
Nitrites, Initial: NEGATIVE
Protein, ur: NEGATIVE
RBC / HPF: NONE SEEN /HPF (ref 0–2)
Specific Gravity, Urine: 1.004 (ref 1.001–1.03)
Squamous Epithelial / HPF: NONE SEEN /HPF (ref <=5)
WBC, UA: NONE SEEN /HPF (ref 0–5)
pH: 6.5 (ref 5.0–8.0)

## 2019-06-09 LAB — COMPLETE METABOLIC PANEL WITH GFR
AG Ratio: 1.4 (calc) (ref 1.0–2.5)
ALT: 34 U/L — ABNORMAL HIGH (ref 6–29)
AST: 27 U/L (ref 10–35)
Albumin: 3.9 g/dL (ref 3.6–5.1)
Alkaline phosphatase (APISO): 80 U/L (ref 37–153)
BUN: 9 mg/dL (ref 7–25)
CO2: 27 mmol/L (ref 20–32)
Calcium: 8.8 mg/dL (ref 8.6–10.4)
Chloride: 101 mmol/L (ref 98–110)
Creat: 0.61 mg/dL (ref 0.50–1.05)
GFR, Est African American: 122 mL/min/{1.73_m2} (ref >=60)
GFR, Est Non African American: 105 mL/min/{1.73_m2} (ref >=60)
Globulin: 2.7 g/dL (calc) (ref 1.9–3.7)
Glucose, Bld: 121 mg/dL — ABNORMAL HIGH (ref 65–99)
Potassium: 4 mmol/L (ref 3.5–5.3)
Sodium: 137 mmol/L (ref 135–146)
Total Bilirubin: 0.3 mg/dL (ref 0.2–1.2)
Total Protein: 6.6 g/dL (ref 6.1–8.1)

## 2019-06-09 LAB — CBC WITH DIFFERENTIAL/PLATELET
Absolute Monocytes: 1297 cells/uL — ABNORMAL HIGH (ref 200–950)
Basophils Absolute: 99 cells/uL (ref 0–200)
Basophils Relative: 1 %
Eosinophils Absolute: 149 cells/uL (ref 15–500)
Eosinophils Relative: 1.5 %
HCT: 39.9 % (ref 35.0–45.0)
Hemoglobin: 13 g/dL (ref 11.7–15.5)
Lymphs Abs: 1940 cells/uL (ref 850–3900)
MCH: 26.9 pg — ABNORMAL LOW (ref 27.0–33.0)
MCHC: 32.6 g/dL (ref 32.0–36.0)
MCV: 82.4 fL (ref 80.0–100.0)
MPV: 9.1 fL (ref 7.5–12.5)
Monocytes Relative: 13.1 %
Neutro Abs: 6415 cells/uL (ref 1500–7800)
Neutrophils Relative %: 64.8 %
Platelets: 350 10*3/uL (ref 140–400)
RBC: 4.84 10*6/uL (ref 3.80–5.10)
RDW: 13.7 % (ref 11.0–15.0)
Total Lymphocyte: 19.6 %
WBC: 9.9 10*3/uL (ref 3.8–10.8)

## 2019-06-09 LAB — LIPASE: Lipase: 14 U/L (ref 7–60)

## 2019-06-09 LAB — URINE CULTURE
MICRO NUMBER:: 743815
Result:: NO GROWTH
SPECIMEN QUALITY:: ADEQUATE

## 2019-06-09 LAB — CULTURE INDICATED

## 2019-06-09 LAB — TSH: TSH: 0.56 mIU/L

## 2019-06-09 LAB — AMYLASE: Amylase: 21 U/L (ref 21–101)

## 2019-06-12 ENCOUNTER — Encounter: Payer: Self-pay | Admitting: Physical Therapy

## 2019-06-12 ENCOUNTER — Other Ambulatory Visit: Payer: Self-pay

## 2019-06-12 ENCOUNTER — Ambulatory Visit (INDEPENDENT_AMBULATORY_CARE_PROVIDER_SITE_OTHER): Payer: BC Managed Care – PPO | Admitting: Physical Therapy

## 2019-06-12 DIAGNOSIS — M25561 Pain in right knee: Secondary | ICD-10-CM | POA: Diagnosis not present

## 2019-06-12 DIAGNOSIS — M25661 Stiffness of right knee, not elsewhere classified: Secondary | ICD-10-CM | POA: Diagnosis not present

## 2019-06-12 DIAGNOSIS — G8929 Other chronic pain: Secondary | ICD-10-CM | POA: Diagnosis not present

## 2019-06-12 DIAGNOSIS — M25551 Pain in right hip: Secondary | ICD-10-CM | POA: Diagnosis not present

## 2019-06-12 NOTE — Therapy (Signed)
[facility] [address] [place], [place], [address] Phone: [phone]   Fax:  [phone]  Physical Therapy Treatment  Patient Details  Name: [name] MRN: [id number] Date of Birth: 10/30/1966 Referring Provider (PT): Rodney Langtonhekkekandam, Thomas, MD   Encounter Date: 06/12/2019  PT End of Session - 06/12/19 0800    Visit Number  2    Number of Visits  16    Date for PT Re-Evaluation  08/08/19    Authorization Type  BCBS    PT Start Time  0800    PT Stop Time  0849    PT Time Calculation (min)  49 min    Activity Tolerance  Patient tolerated treatment well    Behavior During Therapy  [facility] for tasks assessed/performed       Past Medical History:  Diagnosis Date  . Thyroid disease     Past Surgical History:  Procedure Laterality Date  . CHOLECYSTECTOMY  1996  . TONSILLECTOMY      There were no vitals filed for this visit.  Subjective Assessment - 06/12/19 0803    Subjective  Pt reports she hasn't done any of the exercises since last visit.  Schedule has been busy. She has told herself she will be better about exercising after today.    Patient Stated Goals  I want to be able to walk and workout without hurting    Currently in Pain?  Yes    Pain Score  5     Pain Location  Hip    Pain Orientation  Right    Pain Descriptors / Indicators  Dull;Aching    Aggravating Factors   prolonged standing/ walking/ sitting    Pain Relieving Factors  resting, ice, stretching         [facility] PT Assessment - 06/12/19 0001      Assessment   Medical Diagnosis  M17.11 - OA R knee, pain in R hip and thoracic spine    Referring Provider (PT)  Rodney Langtonhekkekandam, Thomas, MD    Hand Dominance  Right    Next MD Visit  f/u after therapy    Prior Therapy  knee pain years ago       [facility] Treatment/Exercise - 06/12/19 0001      Self-Care   Self-Care  Other Self-Care Comments    Other Self-Care Comments   pt instructed in self  massage with ball and roller stick; pt returned demo with cues       Knee/Hip Exercises: Stretches   Passive Hamstring Stretch  Right;Left;2 reps;30 seconds    Hip Flexor Stretch  Right;Left;2 reps;20 seconds   seated    Hip Flexor Stretch Limitations  cues to tuck pelvis under for quad stretch     ITB Stretch  Right;Left;2 reps;20 seconds   supine    Piriformis Stretch  Right;Left;2 reps;20 seconds   supine/ seated     Knee/Hip Exercises: Aerobic   Nustep  L4:  5 min       Modalities   Modalities  Electrical Stimulation;Moist Heat      Moist Heat Therapy   Number Minutes Moist Heat  10 Minutes    Moist Heat Location  Hip;Knee      Electrical Stimulation   Electrical Stimulation Location  Rt hip     Electrical Stimulation Action  IFC    Electrical Stimulation Parameters  to tolerancw    Electrical Stimulation Goals  Pain  PT Education - 06/12/19 0856    Education Details  HEP,  TENS    Person(s) Educated  Patient    Methods  Explanation;Handout;Demonstration;Verbal cues    Comprehension  Verbalized understanding;Returned demonstration          PT Long Term Goals - 06/08/19 1349      PT LONG TERM GOAL #1   Title  Pt will be independent in her HEP and progression.    Baseline  initial HEP issued 06/08/19    Time  8    Period  Weeks    Status  New    Target Date  08/03/19      PT LONG TERM GOAL #2   Title  Pt will be able to amb for 20 minutes with pain </= 2/10 in her hip and knee.    Baseline  pain can increase to 8-9/10 when walking longer distances    Time  8    Period  Weeks    Status  New    Target Date  08/03/19      PT LONG TERM GOAL #3   Title  Pt will improve her FOTO score from 51% limitation to </= 39% limitation.    Baseline  51% limiation on 06/08/2019    Time  8    Period  Weeks    Status  New    Target Date  08/03/19      PT LONG TERM GOAL #4   Title  Pt will improve bialteral LE hip strength to >/4+/5 in order to improve  funcitonal mobility.    Baseline  see flowsheets    Time  8    Period  Weeks    Status  New    Target Date  08/03/19      PT LONG TERM GOAL #5   Title  Pt wil be able to perform functional squat in order to pick up objects off the floor with pain </= 3/10.    Baseline  pain reported 8/10    Time  8    Period  Weeks    Status  New    Target Date  08/03/19            Plan - 06/12/19 0844    Clinical Impression Statement  HEP reviewed and modifications/ alternatives trialed.  Pt reported increased Rt hip pain with bridge; relieved with piriformis stretch (difficult to perform due to increased Rt knee pain in this position).  Pt reported reduction in hip pain after self massage, and further reduction with heat and estim.  Goals are ongoing at this time.    Rehab Potential  Good    PT Frequency  2x / week    PT Duration  8 weeks    PT Treatment/Interventions  Cryotherapy;Electrical Stimulation;Iontophoresis 4mg /ml Dexamethasone;Moist Heat;Ultrasound;Gait training;Stair training;Balance training;Therapeutic exercise;Therapeutic activities;Functional mobility training;Neuromuscular re-education;Patient/family education;Passive range of motion;Dry needling;Taping;Manual techniques    PT Next Visit Plan  continue stretching/ strengthing for RLE.  trial of manual therapy to Rt hip.    Consulted and Agree with Plan of Care  Patient       Patient will benefit from skilled therapeutic intervention in order to improve the following deficits and impairments:  Pain, Decreased strength, Decreased activity tolerance, Decreased range of motion, Obesity, Difficulty walking  Visit Diagnosis: 1. Chronic pain of right knee   2. Pain in right hip   3. Stiffness of right knee, not elsewhere classified        Problem  List Patient Active Problem List   Diagnosis Date Noted  . Acute abdominal pain 06/06/2019  . Adult ADHD 03/14/2019  . Excessive daytime sleepiness 03/14/2019  . Pelvic pain in  female 11/24/2017  . Hyperlipidemia, mixed 08/16/2017  . Plantar fasciitis, right 07/07/2017  . Primary osteoarthritis of right knee 11/26/2016  . Obesity 01/30/2016  . Generalized anxiety disorder 12/05/2015  . Chest pain 12/05/2015  . Hypothyroidism 12/05/2015  . Positive ANA (antinuclear antibody) 12/05/2015   Kerin Perna, PTA 06/12/19 8:56 AM  [facility] [address] [place], [place], [address] Phone: [phone]   Fax:  [phone]  Name: Ann Greene MRN: [id number] Date of Birth: 11-04-1965

## 2019-06-12 NOTE — Patient Instructions (Signed)
  Piriformis Stretch    Lying on back, pull right knee toward opposite shoulder. Hold _15-30___ seconds. Repeat __2__ times. Do _2___ sessions per day. Can do in seated position  TENS UNIT: This is helpful for muscle pain and spasm.   Search and Purchase a TENS 7000 2nd edition at [employer]  It should be less than $30.     TENS unit instructions: Do not shower or bathe with the unit on Turn the unit off before removing electrodes or batteries If the electrodes lose stickiness add a drop of water to the electrodes after they are disconnected from the unit and place on plastic sheet. If you continued to have difficulty, call the TENS unit company to purchase more electrodes. Do not apply lotion on the skin area prior to use. Make sure the skin is clean and dry as this will help prolong the life of the electrodes. After use, always check skin for unusual red areas, rash or other skin difficulties. If there are any skin problems, does not apply electrodes to the same area. Never remove the electrodes from the unit by pulling the wires. Do not use the TENS unit or electrodes other than as directed. Do not change electrode placement without consultating your therapist or physician. Keep 2 fingers width between each electrode. Wear time ratio is 2:1, on to off times.    For example on for 30 minutes off for 15 minutes and then on for 30 minutes off for 15 minutes

## 2019-06-14 ENCOUNTER — Ambulatory Visit (INDEPENDENT_AMBULATORY_CARE_PROVIDER_SITE_OTHER): Payer: BC Managed Care – PPO | Admitting: Physical Therapy

## 2019-06-14 ENCOUNTER — Other Ambulatory Visit: Payer: Self-pay

## 2019-06-14 ENCOUNTER — Telehealth: Payer: Self-pay | Admitting: Sports Medicine

## 2019-06-14 DIAGNOSIS — M25661 Stiffness of right knee, not elsewhere classified: Secondary | ICD-10-CM

## 2019-06-14 DIAGNOSIS — M25551 Pain in right hip: Secondary | ICD-10-CM | POA: Diagnosis not present

## 2019-06-14 DIAGNOSIS — G8929 Other chronic pain: Secondary | ICD-10-CM | POA: Diagnosis not present

## 2019-06-14 DIAGNOSIS — F909 Attention-deficit hyperactivity disorder, unspecified type: Secondary | ICD-10-CM

## 2019-06-14 DIAGNOSIS — M25561 Pain in right knee: Secondary | ICD-10-CM | POA: Diagnosis not present

## 2019-06-14 MED ORDER — LISDEXAMFETAMINE DIMESYLATE 20 MG PO CAPS: 20.0000 mg | ORAL_CAPSULE | Freq: Every day | ORAL | 0 refills | Status: DC

## 2019-06-14 NOTE — Therapy (Signed)
[facility] [address] [place], [place], [address] Phone: [phone]   Fax:  [phone]  Physical Therapy Treatment  Patient Details  Name: Ann Greene Remo MRN: [id number] Date of Birth: 04/09/1966 Referring Provider (PT): Rodney Langtonhekkekandam, Thomas, MD   Encounter Date: 06/14/2019  PT End of Session - 06/14/19 0721    Visit Number  3    Number of Visits  16    Date for PT Re-Evaluation  08/08/19    Authorization Type  BCBS    PT Start Time  0716    PT Stop Time  0801    PT Time Calculation (min)  45 min    Activity Tolerance  Patient tolerated treatment well    Behavior During Therapy  [facility] for tasks assessed/performed       Past Medical History:  Diagnosis Date  . Thyroid disease     Past Surgical History:  Procedure Laterality Date  . CHOLECYSTECTOMY  1996  . TONSILLECTOMY      There were no vitals filed for this visit.  Subjective Assessment - 06/14/19 0722    Subjective  Pt reports she performed exercises yesterday and her hip is not hurting as bad during day.  Pleasantly surprised.   Patient Stated Goals  I want to be able to walk and workout without hurting    Currently in Pain?  No/denies    Pain Score  0-No pain         [facility] PT Assessment - 06/14/19 0001      Assessment   Medical Diagnosis  M17.11 - OA R knee, pain in R hip and thoracic spine    Referring Provider (PT)  Rodney Langtonhekkekandam, Thomas, MD    Hand Dominance  Right    Next MD Visit  f/u after therapy    Prior Therapy  knee pain years ago       [facility] Treatment/Exercise - 06/14/19 0001      Knee/Hip Exercises: Stretches   Passive Hamstring Stretch  Right;Left;2 reps;30 seconds   seated   Quad Stretch  Right;Left;1 rep;20 seconds    Hip Flexor Stretch  Right;Left;2 reps;20 seconds   seated    ITB Stretch Limitations  shown seated, hamstring with toes turning in x 20 sec    Piriformis Stretch  Right;4 reps;Left;2 reps;20 seconds    Piriformis Stretch Limitations  seated not tolerated on RLE; supine and modified table x 1 on RLE      Knee/Hip Exercises: Aerobic   Nustep  L4:  4.25 min    Rt hip began hurting at 4 min     Knee/Hip Exercises: Supine   Straight Leg Raises  Strengthening;Right;Left;1 set;10 reps      Knee/Hip Exercises: Sidelying   Hip ABduction  Strengthening;Right;1 set;5 reps   leading with heel   Hip ABduction Limitations  fatigues quickly    Clams  RLE 2 sets of 5       Moist Heat Therapy   Number Minutes Moist Heat  10 Minutes    Moist Heat Location  Hip;Knee      Electrical Stimulation   Electrical Stimulation Location  Rt hip     Electrical Stimulation Action  IFC    Electrical Stimulation Parameters  to tolerance    Electrical Stimulation Goals  Pain             PT Education - 06/14/19 0754    Education Details  HEP    Person(s) Educated  Patient  Methods  Explanation;Demonstration;Tactile cues;Verbal cues    Comprehension  Returned demonstration;Verbalized understanding          PT Long Term Goals - 06/08/19 1349      PT LONG TERM GOAL #1   Title  Pt will be independent in her HEP and progression.    Baseline  initial HEP issued 06/08/19    Time  8    Period  Weeks    Status  New    Target Date  08/03/19      PT LONG TERM GOAL #2   Title  Pt will be able to amb for 20 minutes with pain </= 2/10 in her hip and knee.    Baseline  pain can increase to 8-9/10 when walking longer distances    Time  8    Period  Weeks    Status  New    Target Date  08/03/19      PT LONG TERM GOAL #3   Title  Pt will improve her FOTO score from 51% limitation to </= 39% limitation.    Baseline  51% limiation on 06/08/2019    Time  8    Period  Weeks    Status  New    Target Date  08/03/19      PT LONG TERM GOAL #4   Title  Pt will improve bialteral LE hip strength to >/4+/5 in order to improve funcitonal mobility.    Baseline  see flowsheets    Time  8    Period  Weeks     Status  New    Target Date  08/03/19      PT LONG TERM GOAL #5   Title  Pt wil be able to perform functional squat in order to pick up objects off the floor with pain </= 3/10.    Baseline  pain reported 8/10    Time  8    Period  Weeks    Status  New    Target Date  08/03/19            Plan - 06/14/19 0751    Clinical Impression Statement  Pt reported increased Rt knee pain after she transitioned to get on NuStep at beginning of session.  She fatigued quickly with SLR and sidelying hip abdct.  Pt tolerated seated table piriformis stretch best of all versions (see HEP).  Pt has had positive response to exercises; progressing towards goals.    Rehab Potential  Good    PT Frequency  2x / week    PT Duration  8 weeks    PT Treatment/Interventions  Cryotherapy;Electrical Stimulation;Iontophoresis 4mg /ml Dexamethasone;Moist Heat;Ultrasound;Gait training;Stair training;Balance training;Therapeutic exercise;Therapeutic activities;Functional mobility training;Neuromuscular re-education;Patient/family education;Passive range of motion;Dry needling;Taping;Manual techniques    PT Next Visit Plan  continue stretching/ strengthing for RLE.  trial of manual therapy to Rt hip, IASTM to distal quad, possible ktape to knee.    Consulted and Agree with Plan of Care  Patient       Patient will benefit from skilled therapeutic intervention in order to improve the following deficits and impairments:  Pain, Decreased strength, Decreased activity tolerance, Decreased range of motion, Obesity, Difficulty walking  Visit Diagnosis: 1. Chronic pain of right knee   2. Pain in right hip   3. Stiffness of right knee, not elsewhere classified        Problem List Patient Active Problem List   Diagnosis Date Noted  . Acute abdominal pain 06/06/2019  . Adult  ADHD 03/14/2019  . Excessive daytime sleepiness 03/14/2019  . Pelvic pain in female 11/24/2017  . Hyperlipidemia, mixed 08/16/2017  . Plantar  fasciitis, right 07/07/2017  . Primary osteoarthritis of right knee 11/26/2016  . Obesity 01/30/2016  . Generalized anxiety disorder 12/05/2015  . Chest pain 12/05/2015  . Hypothyroidism 12/05/2015  . Positive ANA (antinuclear antibody) 12/05/2015   Mayer CamelJennifer Carlson-Long, PTA 06/14/19 8:05 AM  [facility] [address] [place], [place], [address] Phone: [phone]   Fax:  [phone]  Name: Ann Greene Messner MRN: [id number] Date of Birth: 08/23/1966

## 2019-06-14 NOTE — Patient Instructions (Signed)
Access Code: [email or web address]  URL: [email or web address]  Date: 06/14/2019  Prepared by: Kerin Perna   Exercises  Supine Bridge - 10 reps - 2 sets - 5 hold - 1x daily - 3-4x weekly  Active Straight Leg Raise with Quad Set - 5-10 reps - 2 sets - 2 seconds hold - 1x daily - 3-4x weekly  Clam - 5-10 reps - 2 sets - 1x daily - 3-4x weekly  Sidelying Hip Abduction - 5-10 reps - 2 sets - 1x daily - 3-4x weekly  Hooklying Hamstring Stretch with Strap - 2 reps - 20 seconds hold - 2x daily - 7x weekly  Hooklying Single Knee to Chest - 2 reps - 20 seconds hold - 2x daily - 7x weekly  Supine Piriformis Stretch with Foot on Ground - 20 seconds hold - 2x daily - 7x weekly  Seated Table Piriformis Stretch - 2 reps - 1 sets - 20 hold - 2x daily - 7x weekly

## 2019-06-14 NOTE — Telephone Encounter (Signed)
Molly stopped in this morning to let us know that she is in need of a refill on VYVANSE (20mg  cap.). [facility] [place] is the correct pharmacy. Her cell number is the correct number in the system.

## 2019-06-14 NOTE — Telephone Encounter (Signed)
Done

## 2019-06-19 ENCOUNTER — Other Ambulatory Visit: Payer: Self-pay

## 2019-06-19 ENCOUNTER — Ambulatory Visit (INDEPENDENT_AMBULATORY_CARE_PROVIDER_SITE_OTHER): Payer: BC Managed Care – PPO | Admitting: Physical Therapy

## 2019-06-19 DIAGNOSIS — G8929 Other chronic pain: Secondary | ICD-10-CM | POA: Diagnosis not present

## 2019-06-19 DIAGNOSIS — M25561 Pain in right knee: Secondary | ICD-10-CM

## 2019-06-19 DIAGNOSIS — M25661 Stiffness of right knee, not elsewhere classified: Secondary | ICD-10-CM | POA: Diagnosis not present

## 2019-06-19 DIAGNOSIS — M25551 Pain in right hip: Secondary | ICD-10-CM | POA: Diagnosis not present

## 2019-06-19 NOTE — Therapy (Signed)
[facility] [place] [address] [place], [place], [address] Phone: [phone]   Fax:  [phone]  Physical Therapy Treatment  Patient Details  Name: Ann Greene MRN: [id number] Date of Birth: 12/18/1965 Referring Provider (PT): Rodney Langtonhekkekandam, Thomas, MD   Encounter Date: 06/19/2019  PT End of Session - 06/19/19 0809    Visit Number  4    Number of Visits  16    Date for PT Re-Evaluation  08/08/19    Authorization Type  BCBS    PT Start Time  0804    PT Stop Time  0852    PT Time Calculation (min)  48 min       Past Medical History:  Diagnosis Date  . Thyroid disease     Past Surgical History:  Procedure Laterality Date  . CHOLECYSTECTOMY  1996  . TONSILLECTOMY      There were no vitals filed for this visit.  Subjective Assessment - 06/19/19 0809    Subjective  Pt reports she feels ill from her monthly cycle, but she can tell things are improving for her knee and hip.  Was able to shop with less pain in knee and hip yesterday.    Pertinent History  obesity, anxiety, ADHD, thyroid disease, plantar fascitis,    Patient Stated Goals  I want to be able to walk and workout without hurting    Currently in Pain?  Yes    Pain Score  8     Pain Location  [place]   period back pain   Pain Orientation  Right    Pain Descriptors / Indicators  Aching         [facility] PT Assessment - 06/19/19 0001      Assessment   Medical Diagnosis  M17.11 - OA R knee, pain in R hip and thoracic spine    Referring Provider (PT)  Rodney Langtonhekkekandam, Thomas, MD    Hand Dominance  Right    Next MD Visit  f/u after therapy    Prior Therapy  knee pain years ago        [facility]/Exercise - 06/19/19 0001      Knee/Hip Exercises: Stretches   Passive Hamstring Stretch  Right;Left;2 reps;30 seconds   seated   Quad Stretch  Right;Left;2 reps;20 seconds   seated, foot under chair    Piriformis Stretch  Right;Left;2 reps;20 seconds    seated; improved tolerance   Piriformis Stretch Limitations  plus 2 reps of modified table stretch       Knee/Hip Exercises: Aerobic   Nustep  L4:  5 min    Rt hip began to hurt towards end of time     Knee/Hip Exercises: Seated   Sit to Sand  5 reps;without UE support   eccentric lowering     Knee/Hip Exercises: Supine   Bridges  Strengthening;1 set;5 reps   5 sec hold      Knee/Hip Exercises: Sidelying   Clams  RLE x 10 reps       Moist Heat Therapy   Number Minutes Moist Heat  12 Minutes    Moist Heat Location  Hip;Lumbar Spine      Electrical Stimulation   Electrical Stimulation Location  Rt hip / low back    Electrical Stimulation Action  IFC    Electrical Stimulation Parameters  to tolerance    Electrical Stimulation Goals  Pain      Manual Therapy   Manual Therapy  Soft tissue  mobilization;Taping    Manual therapy comments  I strip of reg Rock tape applied to Rt medial knee in half horse shoe shape, 15% stretch; perpendicular strip applied with 50% stretch over joint line - to decmpress tissue and increase proprioception.     Soft tissue mobilization  IASTM and STM to Rt quad to decrease fascial adhesions and pain.                   PT Long Term Goals - 06/08/19 1349      PT LONG TERM GOAL #1   Title  Pt will be independent in her HEP and progression.    Baseline  initial HEP issued 06/08/19    Time  8    Period  Weeks    Status  New    Target Date  08/03/19      PT LONG TERM GOAL #2   Title  Pt will be able to amb for 20 minutes with pain </= 2/10 in her hip and knee.    Baseline  pain can increase to 8-9/10 when walking longer distances    Time  8    Period  Weeks    Status  New    Target Date  08/03/19      PT LONG TERM GOAL #3   Title  Pt will improve her FOTO score from 51% limitation to </= 39% limitation.    Baseline  51% limiation on 06/08/2019    Time  8    Period  Weeks    Status  New    Target Date  08/03/19      PT LONG TERM  GOAL #4   Title  Pt will improve bialteral LE hip strength to >/4+/5 in order to improve funcitonal mobility.    Baseline  see flowsheets    Time  8    Period  Weeks    Status  New    Target Date  08/03/19      PT LONG TERM GOAL #5   Title  Pt wil be able to perform functional squat in order to pick up objects off the floor with pain </= 3/10.    Baseline  pain reported 8/10    Time  8    Period  Weeks    Status  New    Target Date  08/03/19            Plan - 06/19/19 0901    Clinical Impression Statement  Pt reporting overall reduction in Rt knee and hip pain.  Pt participated well throughout session despite high pain level reported regarding period.  Pt now able to complete 10 clams prior to fatigue and seated piriformis stretch with less difficulty.  Progressing towards goals.    Comorbidities  ADHD, anxiety, thyroid disease, obesity    Rehab Potential  Good    PT Frequency  2x / week    PT Duration  8 weeks    PT Treatment/Interventions  Cryotherapy;Electrical Stimulation;Iontophoresis 4mg /ml Dexamethasone;Moist Heat;Ultrasound;Gait training;Stair training;Balance training;Therapeutic exercise;Therapeutic activities;Functional mobility training;Neuromuscular re-education;Patient/family education;Passive range of motion;Dry needling;Taping;Manual techniques    PT Next Visit Plan  assess response to tape.STM to hip/ knee.  continue flexibility and strengthening in LE.    Consulted and Agree with Plan of Care  Patient       Patient will benefit from skilled therapeutic intervention in order to improve the following deficits and impairments:  Pain, Decreased strength, Decreased activity tolerance, Decreased range of motion, Obesity,  Difficulty walking  Visit Diagnosis: 1. Chronic pain of right knee   2. Pain in right hip   3. Stiffness of right knee, not elsewhere classified        Problem List Patient Active Problem List   Diagnosis Date Noted  . Acute abdominal pain  06/06/2019  . Adult ADHD 03/14/2019  . Excessive daytime sleepiness 03/14/2019  . Pelvic pain in female 11/24/2017  . Hyperlipidemia, mixed 08/16/2017  . Plantar fasciitis, right 07/07/2017  . Primary osteoarthritis of right knee 11/26/2016  . Obesity 01/30/2016  . Generalized anxiety disorder 12/05/2015  . Chest pain 12/05/2015  . Hypothyroidism 12/05/2015  . Positive ANA (antinuclear antibody) 12/05/2015   Mayer CamelJennifer Carlson-Long, PTA 06/19/19 9:09 AM  [facility] [address] [place], [place], [address] Phone: [phone]   Fax:  [phone]  Name: Ann SharkMary Greene MRN: [id number] Date of Birth: 07/25/1966

## 2019-06-19 NOTE — Patient Instructions (Signed)

## 2019-06-21 ENCOUNTER — Other Ambulatory Visit: Payer: Self-pay

## 2019-06-21 ENCOUNTER — Ambulatory Visit (INDEPENDENT_AMBULATORY_CARE_PROVIDER_SITE_OTHER): Payer: BC Managed Care – PPO | Admitting: Rehabilitative and Restorative Service Providers"

## 2019-06-21 ENCOUNTER — Encounter: Payer: Self-pay | Admitting: Rehabilitative and Restorative Service Providers"

## 2019-06-21 DIAGNOSIS — M25561 Pain in right knee: Secondary | ICD-10-CM

## 2019-06-21 DIAGNOSIS — G8929 Other chronic pain: Secondary | ICD-10-CM

## 2019-06-21 DIAGNOSIS — R262 Difficulty in walking, not elsewhere classified: Secondary | ICD-10-CM | POA: Diagnosis not present

## 2019-06-21 DIAGNOSIS — M25551 Pain in right hip: Secondary | ICD-10-CM

## 2019-06-21 DIAGNOSIS — M25661 Stiffness of right knee, not elsewhere classified: Secondary | ICD-10-CM | POA: Diagnosis not present

## 2019-06-21 DIAGNOSIS — M545 Low back pain: Secondary | ICD-10-CM

## 2019-06-21 NOTE — Patient Instructions (Addendum)
Trigger Point Dry Needling  . What is Trigger Point Dry Needling (DN)? o DN is a physical therapy technique used to treat muscle pain and dysfunction. Specifically, DN helps deactivate muscle trigger points (muscle knots).  o A thin filiform needle is used to penetrate the skin and stimulate the underlying trigger point. The goal is for a local twitch response (LTR) to occur and for the trigger point to relax. No medication of any kind is injected during the procedure.   . What Does Trigger Point Dry Needling Feel Like?  o The procedure feels different for each individual patient. Some patients report that they do not actually feel the needle enter the skin and overall the process is not painful. Very mild bleeding may occur. However, many patients feel a deep cramping in the muscle in which the needle was inserted. This is the local twitch response.   Marland Kitchen How Will I feel after the treatment? o Soreness is normal, and the onset of soreness may not occur for a few hours. Typically this soreness does not last longer than two days.  o Bruising is uncommon, however; ice can be used to decrease any possible bruising.  o In rare cases feeling tired or nauseous after the treatment is normal. In addition, your symptoms may get worse before they get better, this period will typically not last longer than 24 hours.   . What Can I do After My Treatment? o Increase your hydration by drinking more water for the next 24 hours. o You may place ice or heat on the areas treated that have become sore, however, do not use heat on inflamed or bruised areas. Heat often brings more relief post needling. o You can continue your regular activities, but vigorous activity is not recommended initially after the treatment for 24 hours. o DN is best combined with other physical therapy such as strengthening, stretching, and other therapies.   Access Code: [email or web address]  URL: [email or web address]  Date: 06/21/2019   Prepared by: Gillermo Murdoch   Exercises  Hooklying Isometric Clamshell - 10 reps - 1 sets - 2 sec hold - 1x daily - 7x weekly  Patient Education  Biomedical scientist

## 2019-06-21 NOTE — Therapy (Signed)
[facility] [address] [place], [place], [address] Phone: [phone]   Fax:  [phone]  Physical Therapy Treatment  Patient Details  Name: Ann Greene Micco MRN: [id number] Date of Birth: 11/06/1965 Referring Provider (PT): Rodney Langtonhekkekandam, Thomas, MD   Encounter Date: 06/21/2019  PT End of Session - 06/21/19 0806    Visit Number  5    Number of Visits  16    Authorization Type  BCBS    PT Start Time  0803    PT Stop Time  0848    PT Time Calculation (min)  45 min    Activity Tolerance  Patient tolerated treatment well       Past Medical History:  Diagnosis Date  . Thyroid disease     Past Surgical History:  Procedure Laterality Date  . CHOLECYSTECTOMY  1996  . TONSILLECTOMY      There were no vitals filed for this visit.  Subjective Assessment - 06/21/19 0806    Subjective  Can tell she is improving. Can sit longer at computer before having pain. Does not awaken with as much pain. Painting her deck at home - with roller and some on hands and knees with a brush.    Currently in Pain?  Yes    Pain Score  5     Pain Location  [place]    Pain Orientation  Right    Pain Descriptors / Indicators  Aching    Pain Type  Chronic pain    Pain Onset  More than a month ago    Pain Frequency  Constant                       [facility] Adult PT Treatment/Exercise - 06/21/19 0001      Self-Care   Other Self-Care Comments   --   back care/ergonomic ed      Knee/Hip Exercises: Stretches   Piriformis Stretch  Right;Left;2 reps;20 seconds   seated; improved tolerance   Piriformis Stretch Limitations  plus 2 reps of modified table stretch       Knee/Hip Exercises: Aerobic   Nustep  L5:  5 min LE only   Rt hip began to hurt towards end of time     Knee/Hip Exercises: Supine   Bridges  Strengthening;2 sets   5 sec hold 2 sets of 8    Straight Leg Raises  Strengthening;Right;Left;1 set;10 reps    Other Supine  Knee/Hip Exercises  clam green TB alternating LE's VC for core engaged     Other Supine Knee/Hip Exercises  snow angel prolonged - on noodle x 2 min       Moist Heat Therapy   Number Minutes Moist Heat  14 Minutes    Moist Heat Location  Hip;Lumbar Spine      Electrical Stimulation   Electrical Stimulation Location  Rt hip / low back    Electrical Stimulation Action  IFC    Electrical Stimulation Parameters  to tolerance    Electrical Stimulation Goals  Pain;Tone      Manual Therapy   Manual therapy comments  pt prone     Joint Mobilization  PA mobs greater trochanter     Soft tissue mobilization  deep tissue work posterior Rt hip - piriformis and gluts              PT Education - 06/21/19 0837    Education Details  HEP; ergonomics; back care; DN  Person(s) Educated  Patient    Methods  Explanation;Demonstration;Tactile cues;Verbal cues;Handout    Comprehension  Verbalized understanding;Returned demonstration;Verbal cues required;Tactile cues required          PT Long Term Goals - 06/08/19 1349      PT LONG TERM GOAL #1   Title  Pt will be independent in her HEP and progression.    Baseline  initial HEP issued 06/08/19    Time  8    Period  Weeks    Status  New    Target Date  08/03/19      PT LONG TERM GOAL #2   Title  Pt will be able to amb for 20 minutes with pain </= 2/10 in her hip and knee.    Baseline  pain can increase to 8-9/10 when walking longer distances    Time  8    Period  Weeks    Status  New    Target Date  08/03/19      PT LONG TERM GOAL #3   Title  Pt will improve her FOTO score from 51% limitation to </= 39% limitation.    Baseline  51% limiation on 06/08/2019    Time  8    Period  Weeks    Status  New    Target Date  08/03/19      PT LONG TERM GOAL #4   Title  Pt will improve bialteral LE hip strength to >/4+/5 in order to improve funcitonal mobility.    Baseline  see flowsheets    Time  8    Period  Weeks    Status  New     Target Date  08/03/19      PT LONG TERM GOAL #5   Title  Pt wil be able to perform functional squat in order to pick up objects off the floor with pain </= 3/10.    Baseline  pain reported 8/10    Time  8    Period  Weeks    Status  New    Target Date  08/03/19            Plan - 06/21/19 0806    Clinical Impression Statement  Gradual improvement continues with patient reporting less pain and increased mobility. Review of office ergonomics with suggestions for modifications. Tolerated exercise well. Good response to manual work and modalities. Progressing toward stated goals of therapy.    Comorbidities  ADHD, anxiety, thyroid disease, obesity    Rehab Potential  Good    PT Frequency  2x / week    PT Duration  8 weeks    PT Treatment/Interventions  Cryotherapy;Electrical Stimulation;Iontophoresis 4mg /ml Dexamethasone;Moist Heat;Ultrasound;Gait training;Stair training;Balance training;Therapeutic exercise;Therapeutic activities;Functional mobility training;Neuromuscular re-education;Patient/family education;Passive range of motion;Dry needling;Taping;Manual techniques    PT Next Visit Plan  assess response to tape.STM to hip/ knee.  continue flexibility and strengthening in LE.- consider trial of DN    Consulted and Agree with Plan of Care  Patient       Patient will benefit from skilled therapeutic intervention in order to improve the following deficits and impairments:  Pain, Decreased strength, Decreased activity tolerance, Decreased range of motion, Obesity, Difficulty walking  Visit Diagnosis: 1. Chronic pain of right knee   2. Pain in right hip   3. Stiffness of right knee, not elsewhere classified   4. Difficulty in walking, not elsewhere classified   5. Chronic midline low back pain without sciatica  Problem List Patient Active Problem List   Diagnosis Date Noted  . Acute abdominal pain 06/06/2019  . Adult ADHD 03/14/2019  . Excessive daytime sleepiness  03/14/2019  . Pelvic pain in female 11/24/2017  . Hyperlipidemia, mixed 08/16/2017  . Plantar fasciitis, right 07/07/2017  . Primary osteoarthritis of right knee 11/26/2016  . Obesity 01/30/2016  . Generalized anxiety disorder 12/05/2015  . Chest pain 12/05/2015  . Hypothyroidism 12/05/2015  . Positive ANA (antinuclear antibody) 12/05/2015    Lindamarie Maclachlan Rober MinionP Elliotte Marsalis PT, MPH  06/21/2019, 8:42 AM  [facility] [address] [place], [place], [address] Phone: [phone]   Fax:  [phone]  Name: Ann Greene Athey MRN: [id number] Date of Birth: 04/04/1966

## 2019-06-26 ENCOUNTER — Encounter: Payer: BC Managed Care – PPO | Admitting: Rehabilitative and Restorative Service Providers"

## 2019-06-28 ENCOUNTER — Encounter: Payer: BC Managed Care – PPO | Admitting: Physical Therapy

## 2019-06-30 ENCOUNTER — Telehealth: Payer: Self-pay | Admitting: *Deleted

## 2019-06-30 DIAGNOSIS — R102 Pelvic and perineal pain unspecified side: Secondary | ICD-10-CM

## 2019-06-30 MED ORDER — NITROFURANTOIN MONOHYD MACRO 100 MG PO CAPS: 100.0000 mg | ORAL_CAPSULE | Freq: Two times a day (BID) | ORAL | 0 refills | Status: DC

## 2019-06-30 NOTE — Telephone Encounter (Signed)
Pt left a vm stating that her UTI symptoms are starting to return, including urgency & pain.  She wanted to know if you would send her something in. Please advise.

## 2019-06-30 NOTE — Telephone Encounter (Signed)
Adding Macrobid.  If no better in a week she needs to be seen in person.

## 2019-07-03 ENCOUNTER — Encounter: Payer: Self-pay | Admitting: Physical Therapy

## 2019-07-03 ENCOUNTER — Other Ambulatory Visit: Payer: Self-pay

## 2019-07-03 ENCOUNTER — Ambulatory Visit (INDEPENDENT_AMBULATORY_CARE_PROVIDER_SITE_OTHER): Payer: BC Managed Care – PPO | Admitting: Physical Therapy

## 2019-07-03 DIAGNOSIS — M25661 Stiffness of right knee, not elsewhere classified: Secondary | ICD-10-CM

## 2019-07-03 DIAGNOSIS — M25561 Pain in right knee: Secondary | ICD-10-CM | POA: Diagnosis not present

## 2019-07-03 DIAGNOSIS — G8929 Other chronic pain: Secondary | ICD-10-CM

## 2019-07-03 DIAGNOSIS — R262 Difficulty in walking, not elsewhere classified: Secondary | ICD-10-CM

## 2019-07-03 DIAGNOSIS — M25551 Pain in right hip: Secondary | ICD-10-CM | POA: Diagnosis not present

## 2019-07-03 DIAGNOSIS — M545 Low back pain: Secondary | ICD-10-CM

## 2019-07-03 NOTE — Therapy (Signed)
[facility] [place] [address] [place], [place], [address] Phone: [phone]   Fax:  [phone]  Physical Therapy Treatment  Patient Details  Name: Ann Greene MRN: [id number] Date of Birth: 15-Oct-1966 Referring Provider (PT): Rodney Langton, MD   Encounter Date: 07/03/2019  PT End of Session - 07/03/19 0805    Visit Number  6    Number of Visits  16    Date for PT Re-Evaluation  08/08/19    Authorization Type  BCBS    PT Start Time  0803    PT Stop Time  0846    PT Time Calculation (min)  43 min    Activity Tolerance  Patient tolerated treatment well    Behavior During Therapy  [facility] for tasks assessed/performed       Past Medical History:  Diagnosis Date  . Thyroid disease     Past Surgical History:  Procedure Laterality Date  . CHOLECYSTECTOMY  1996  . TONSILLECTOMY      There were no vitals filed for this visit.  Subjective Assessment - 07/03/19 0805    Subjective  Pt feels like she's had a set back.  She had video conferences Mon-Thur (all day).  A lot of sitting.  Not as much time to do exercises. The manual therapy last session gave her relief for a few days.    Patient Stated Goals  I want to be able to walk and workout without hurting    Currently in Pain?  Yes    Pain Score  8     Pain Location  Buttocks    Pain Orientation  Right    Pain Descriptors / Indicators  Aching    Aggravating Factors   prolonged standing, walking/ sitting    Pain Relieving Factors  massage, stretching         [facility] PT Assessment - 07/03/19 0001      Assessment   Medical Diagnosis  M17.11 - OA R knee, pain in R hip and thoracic spine    Referring Provider (PT)  Rodney Langton, MD    Hand Dominance  Right    Next MD Visit  f/u after therapy    Prior Therapy  knee pain years ago      Strength   Right Hip Flexion  4+/5    Right Hip Extension  4/5    Right Hip ABduction  4+/5    Left Hip Flexion  5/5    Left Hip Extension  5/5          [facility] Adult PT Treatment/Exercise - 07/03/19 0001      Knee/Hip Exercises: Stretches   Passive Hamstring Stretch  Right;Left;2 reps;20 seconds    Hip Flexor Stretch  Right;Left;2 reps;20 seconds   seated   Piriformis Stretch  Right;Left;2 reps;20 seconds   table stretch (per HEP)   Other Knee/Hip Stretches  modified childs pose with hands on table x 15 sec x 2;  childs pose x 2 reps of 15 sec; doorway stretch (midlevel) x 20 sec       Knee/Hip Exercises: Aerobic   Nustep  L4: 5.5 min (no increase in pain)      Knee/Hip Exercises: Seated   Clamshell with TheraBand  Green   10 each side, core engaged   Marching  Both;1 set;10 reps   with green band at thighs     Knee/Hip Exercises: Supine   Bridges  1 set;10 reps    Straight Leg Raises  Strengthening;Right;1 set;10 reps;Left;5 reps      Moist Heat Therapy   Number Minutes Moist Heat  --   pt declined; will use TENS at home.      Manual Therapy   Manual therapy comments  pt prone     Joint Mobilization  PA mobs Rt greater trochanter  grade 2-3    Soft tissue mobilization  deep tissue work posterior Rt hip - piriformis and gluts, low back on Rt                   PT Long Term Goals - 06/08/19 1349      PT LONG TERM GOAL #1   Title  Pt will be independent in her HEP and progression.    Baseline  initial HEP issued 06/08/19    Time  8    Period  Weeks    Status  New    Target Date  08/03/19      PT LONG TERM GOAL #2   Title  Pt will be able to amb for 20 minutes with pain </= 2/10 in her hip and knee.    Baseline  pain can increase to 8-9/10 when walking longer distances    Time  8    Period  Weeks    Status  New    Target Date  08/03/19      PT LONG TERM GOAL #3   Title  Pt will improve her FOTO score from 51% limitation to </= 39% limitation.    Baseline  51% limiation on 06/08/2019    Time  8    Period  Weeks    Status  New    Target Date  08/03/19      PT LONG TERM  GOAL #4   Title  Pt will improve bialteral LE hip strength to >/4+/5 in order to improve funcitonal mobility.    Baseline  see flowsheets    Time  8    Period  Weeks    Status  New    Target Date  08/03/19      PT LONG TERM GOAL #5   Title  Pt wil be able to perform functional squat in order to pick up objects off the floor with pain </= 3/10.    Baseline  pain reported 8/10    Time  8    Period  Weeks    Status  New    Target Date  08/03/19            Plan - 07/03/19 0810    Clinical Impression Statement  Pt was able to tolerate NuStep without increase hip pain today.  She tolerated all exercises well, without increase in pain and less fatigue. Pt demonstrated improved LE strength. Progressing towards goals.    Comorbidities  ADHD, anxiety, thyroid disease, obesity    Rehab Potential  Good    PT Frequency  2x / week    PT Duration  8 weeks    PT Treatment/Interventions  Cryotherapy;Electrical Stimulation;Iontophoresis 4mg /ml Dexamethasone;Moist Heat;Ultrasound;Gait training;Stair training;Balance training;Therapeutic exercise;Therapeutic activities;Functional mobility training;Neuromuscular re-education;Patient/family education;Passive range of motion;Dry needling;Taping;Manual techniques    PT Next Visit Plan  continue flexibility and strengthening in LE; manual therapy / modalities as indicated.    Consulted and Agree with Plan of Care  Patient       Patient will benefit from skilled therapeutic intervention in order to improve the following deficits and impairments:  Pain, Decreased strength, Decreased activity tolerance, Decreased range  of motion, Obesity, Difficulty walking  Visit Diagnosis: Chronic pain of right knee  Pain in right hip  Stiffness of right knee, not elsewhere classified  Difficulty in walking, not elsewhere classified  Chronic midline low back pain without sciatica     Problem List Patient Active Problem List   Diagnosis Date Noted  . Acute  abdominal pain 06/06/2019  . Adult ADHD 03/14/2019  . Excessive daytime sleepiness 03/14/2019  . Pelvic pain in female 11/24/2017  . Hyperlipidemia, mixed 08/16/2017  . Plantar fasciitis, right 07/07/2017  . Primary osteoarthritis of right knee 11/26/2016  . Obesity 01/30/2016  . Generalized anxiety disorder 12/05/2015  . Chest pain 12/05/2015  . Hypothyroidism 12/05/2015  . Positive ANA (antinuclear antibody) 12/05/2015   Mayer CamelJennifer Carlson-Long, PTA 07/03/19 10:01 AM  [facility] [address] [place], [place], [address] Phone: [phone]   Fax:  [phone]  Name: Owens SharkMary Kujawa MRN: [id number] Date of Birth: 10/19/1966

## 2019-07-03 NOTE — Telephone Encounter (Signed)
Pt advised.

## 2019-07-05 ENCOUNTER — Other Ambulatory Visit: Payer: Self-pay

## 2019-07-05 ENCOUNTER — Encounter: Payer: Self-pay | Admitting: Physical Therapy

## 2019-07-05 ENCOUNTER — Ambulatory Visit (INDEPENDENT_AMBULATORY_CARE_PROVIDER_SITE_OTHER): Payer: BC Managed Care – PPO | Admitting: Physical Therapy

## 2019-07-05 DIAGNOSIS — M25661 Stiffness of right knee, not elsewhere classified: Secondary | ICD-10-CM

## 2019-07-05 DIAGNOSIS — M25551 Pain in right hip: Secondary | ICD-10-CM | POA: Diagnosis not present

## 2019-07-05 DIAGNOSIS — R262 Difficulty in walking, not elsewhere classified: Secondary | ICD-10-CM | POA: Diagnosis not present

## 2019-07-05 DIAGNOSIS — M25561 Pain in right knee: Secondary | ICD-10-CM | POA: Diagnosis not present

## 2019-07-05 DIAGNOSIS — G8929 Other chronic pain: Secondary | ICD-10-CM

## 2019-07-05 NOTE — Therapy (Signed)
[place], [place], [address] Phone: [phone]   Fax:  [phone]  Physical Therapy Treatment  Patient Details  Name: Ann Greene MRN: [id number] Date of Birth: Jul 26, 1966 Referring Provider (PT): Aundria Mems, MD   Encounter Date: 07/05/2019  PT End of Session - 07/05/19 0903    Visit Number  7    Number of Visits  16    Date for PT Re-Evaluation  08/08/19    Authorization Type  BCBS    PT Start Time  0801    PT Stop Time  0844    PT Time Calculation (min)  43 min    Activity Tolerance  Patient tolerated treatment well    Behavior During Therapy  [facility] for tasks assessed/performed       Past Medical History:  Diagnosis Date  . Thyroid disease     Past Surgical History:  Procedure Laterality Date  . CHOLECYSTECTOMY  1996  . TONSILLECTOMY      There were no vitals filed for this visit.  Subjective Assessment - 07/05/19 0803    Subjective  Feeling the hip and knee this morning; attributes it to the weather. She still has not bought a ball for self massage, and hasn't been able to find her stretch strap and rolling pin (for massage); plans to work on this.    Patient Stated Goals  I want to be able to walk and workout without hurting    Currently in Pain?  Yes    Pain Score  4     Pain Location  --   hip and knee   Pain Orientation  Right    Pain Descriptors / Indicators  Aching    Pain Type  Chronic pain    Aggravating Factors   prolonged standing, walking/ sitting    Pain Relieving Factors  massage and stretching         [facility] PT Assessment - 07/05/19 0001      Assessment   Medical Diagnosis  M17.11 - OA R knee, pain in R hip and thoracic spine    Referring Provider (PT)  Aundria Mems, MD    Hand Dominance  Right    Next MD Visit  f/u after therapy    Prior Therapy  knee pain years ago       [facility] Adult PT Treatment/Exercise - 07/05/19 0001      Knee/Hip  Exercises: Stretches   Passive Hamstring Stretch  Right;Left;2 reps;20 seconds    Quad Stretch  Right;Left;2 reps;20 seconds   prone with strap   Hip Flexor Stretch  Right;Left;2 reps;20 seconds   seated   Piriformis Stretch  Right;Left;2 reps;20 seconds    Other Knee/Hip Stretches  modified childs pose, rolling table out (hands on table ) x 10 sec x 4 reps, regular childs pose (after prone exercises) x 20 sec, x2 reps; cat /cow x 5 reps      Knee/Hip Exercises: Aerobic   Nustep  L4: 5.5 min (no increase in pain)      Knee/Hip Exercises: Prone   Hip Extension  Strengthening;Right;Left;5 reps;1 set    Other Prone Exercises  opp arm/leg x 5 reps each side      Moist Heat Therapy   Number Minutes Moist Heat  --   pt declined; will use TENS at home     Manual Therapy   Manual Therapy  Soft tissue mobilization;Taping    Manual therapy comments  pt prone  for manual therapy to hip;  [place] tape applied to ant / medial Rt knee at joint line in X pattern with 20% stretch to decrease pain and decompress tissue.      Joint Mobilization  PA mobs Rt greater trochanter  grade 2-3    Soft tissue mobilization  deep tissue work posterior Rt hip - piriformis and gluts, low back on Rt           PT Long Term Goals - 07/05/19 0917      PT LONG TERM GOAL #1   Title  Pt will be independent in her HEP and progression.    Baseline  initial HEP issued 06/08/19    Time  8    Period  Weeks    Status  On-going      PT LONG TERM GOAL #2   Title  Pt will be able to amb for 20 minutes with pain </= 2/10 in her hip and knee.    Baseline  pain can increase to 8-9/10 when walking longer distances    Time  8    Period  Weeks    Status  On-going      PT LONG TERM GOAL #3   Title  Pt will improve her FOTO score from 51% limitation to </= 39% limitation.    Baseline  51% limiation on 06/08/2019    Time  8    Period  Weeks    Status  On-going      PT LONG TERM GOAL #4   Title  Pt will improve bialteral  LE hip strength to >/4+/5 in order to improve funcitonal mobility.    Baseline  see flowsheets    Time  8    Period  Weeks    Status  Partially Met      PT LONG TERM GOAL #5   Title  Pt wil be able to perform functional squat in order to pick up objects off the floor with pain </= 3/10.    Baseline  pain reported 8/10    Time  8    Period  Weeks    Status  On-going            Plan - 07/05/19 7654    Clinical Impression Statement  Pt continues with persistant hip tightness and medial knee pain.  She is tolerating exercises well, reporting mild decrease in pain with stretches.  Pt may benefit from DN/manual therapy in future session.  Pt making gradual gains towards goals.    Comorbidities  ADHD, anxiety, thyroid disease, obesity    Rehab Potential  Good    PT Frequency  2x / week    PT Duration  8 weeks    PT Treatment/Interventions  Cryotherapy;Electrical Stimulation;Iontophoresis 85m/ml Dexamethasone;Moist Heat;Ultrasound;Gait training;Stair training;Balance training;Therapeutic exercise;Therapeutic activities;Functional mobility training;Neuromuscular re-education;Patient/family education;Passive range of motion;Dry needling;Taping;Manual techniques    PT Next Visit Plan  continue flexibility and strengthening in LE; manual therapy / modalities as indicated.  issue updated HEP    PT Home Exercise Plan  Access Code: [email or web address]   Consulted and Agree with Plan of Care  Patient       Patient will benefit from skilled therapeutic intervention in order to improve the following deficits and impairments:  Pain, Decreased strength, Decreased activity tolerance, Decreased range of motion, Obesity, Difficulty walking  Visit Diagnosis: Chronic pain of right knee  Pain in right hip  Stiffness of right knee, not elsewhere classified  Difficulty in walking, not  elsewhere classified     Problem List Patient Active Problem List   Diagnosis Date Noted  . Acute abdominal pain  06/06/2019  . Adult ADHD 03/14/2019  . Excessive daytime sleepiness 03/14/2019  . Pelvic pain in female 11/24/2017  . Hyperlipidemia, mixed 08/16/2017  . Plantar fasciitis, right 07/07/2017  . Primary osteoarthritis of right knee 11/26/2016  . Obesity 01/30/2016  . Generalized anxiety disorder 12/05/2015  . Chest pain 12/05/2015  . Hypothyroidism 12/05/2015  . Positive ANA (antinuclear antibody) 12/05/2015   Kerin Perna, PTA 07/05/19 9:18 AM  [place], [place], [address] Phone: [phone]   Fax:  [phone]  Name: Ann Greene MRN: [id number] Date of Birth: 1966-01-24

## 2019-07-11 ENCOUNTER — Other Ambulatory Visit: Payer: Self-pay

## 2019-07-11 ENCOUNTER — Ambulatory Visit (INDEPENDENT_AMBULATORY_CARE_PROVIDER_SITE_OTHER): Payer: BC Managed Care – PPO | Admitting: Physical Therapy

## 2019-07-11 ENCOUNTER — Encounter: Payer: Self-pay | Admitting: Physical Therapy

## 2019-07-11 DIAGNOSIS — M25561 Pain in right knee: Secondary | ICD-10-CM

## 2019-07-11 DIAGNOSIS — M25661 Stiffness of right knee, not elsewhere classified: Secondary | ICD-10-CM | POA: Diagnosis not present

## 2019-07-11 DIAGNOSIS — M25551 Pain in right hip: Secondary | ICD-10-CM | POA: Diagnosis not present

## 2019-07-11 DIAGNOSIS — G8929 Other chronic pain: Secondary | ICD-10-CM

## 2019-07-11 NOTE — Therapy (Signed)
[place], [place], [address] Phone: [phone]   Fax:  [phone]  Physical Therapy Treatment  Patient Details  Name: Ann Greene MRN: [id number] Date of Birth: 20-Mar-1966 Referring Provider (PT): Aundria Mems, MD   Encounter Date: 07/11/2019  PT End of Session - 07/11/19 0807    Visit Number  8    Number of Visits  16    Date for PT Re-Evaluation  08/08/19    Authorization Type  BCBS    PT Start Time  0806    PT Stop Time  0852    PT Time Calculation (min)  46 min       Past Medical History:  Diagnosis Date  . Thyroid disease     Past Surgical History:  Procedure Laterality Date  . CHOLECYSTECTOMY  1996  . TONSILLECTOMY      There were no vitals filed for this visit.  Subjective Assessment - 07/11/19 0808    Subjective  Rt hip and back are sore today from emptying out closets and helping her son move.  She has found some of the tools we discussed (ball, stretch strap).    Patient Stated Goals  I want to be able to walk and workout without hurting    Currently in Pain?  Yes    Pain Score  6     Pain Location  Hip    Pain Orientation  Right    Pain Descriptors / Indicators  Aching;Sore    Aggravating Factors   prolonged standing, bending over    Pain Relieving Factors  stretching         [facility] PT Assessment - 07/11/19 0001      Assessment   Medical Diagnosis  M17.11 - OA R knee, pain in R hip and thoracic spine    Referring Provider (PT)  Aundria Mems, MD    Hand Dominance  Right    Next MD Visit  f/u after therapy    Prior Therapy  knee pain years ago       [facility] Adult PT Treatment/Exercise - 07/11/19 0001      Self-Care   Other Self-Care Comments   pt educated on self massage with theracane, and ball. pt returned demo with cues.       Knee/Hip Exercises: Stretches   Passive Hamstring Stretch  Right;Left;2 reps;20 seconds    Piriformis Stretch   Right;Left;2 reps;20 seconds   modified table   Other Knee/Hip Stretches  modified childs pose, rolling table out (hands on table ) x 10 sec x 2 reps,       Knee/Hip Exercises: Aerobic   Nustep  L5-4: 5.5 min (no increase in pain)      Knee/Hip Exercises: Supine   Bridges  1 set;10 reps      Knee/Hip Exercises: Sidelying   Clams  RLE x 10 reps       Moist Heat Therapy   Number Minutes Moist Heat  10 Minutes    Moist Heat Location  Lumbar Spine;Hip      Electrical Stimulation   Electrical Stimulation Location  Rt hip     Electrical Stimulation Action  IFC    Electrical Stimulation Parameters  to tolerance    Electrical Stimulation Goals  Pain      Manual Therapy   Manual Therapy  Myofascial release;Soft tissue mobilization    Soft tissue mobilization  TPR to Rt glute med and hip rotators with active contraction  Myofascial Release  MFR to Rt glute/ QL                  PT Long Term Goals - 07/05/19 0917      PT LONG TERM GOAL #1   Title  Pt will be independent in her HEP and progression.    Baseline  initial HEP issued 06/08/19    Time  8    Period  Weeks    Status  On-going      PT LONG TERM GOAL #2   Title  Pt will be able to amb for 20 minutes with pain </= 2/10 in her hip and knee.    Baseline  pain can increase to 8-9/10 when walking longer distances    Time  8    Period  Weeks    Status  On-going      PT LONG TERM GOAL #3   Title  Pt will improve her FOTO score from 51% limitation to </= 39% limitation.    Baseline  51% limiation on 06/08/2019    Time  8    Period  Weeks    Status  On-going      PT LONG TERM GOAL #4   Title  Pt will improve bialteral LE hip strength to >/4+/5 in order to improve funcitonal mobility.    Baseline  see flowsheets    Time  8    Period  Weeks    Status  Partially Met      PT LONG TERM GOAL #5   Title  Pt wil be able to perform functional squat in order to pick up objects off the floor with pain </= 3/10.     Baseline  pain reported 8/10    Time  8    Period  Weeks    Status  On-going            Plan - 07/11/19 0844    Clinical Impression Statement  Pt reporting ease of symptoms when she performs HEP more regularly.  Pt reported reduction of tightness with stretches and manual therapy; further reduction with estim/MHP at end of session.  Pt is tolerating exercises well, making gradual gains towards goals.    Comorbidities  ADHD, anxiety, thyroid disease, obesity    Rehab Potential  Good    PT Frequency  2x / week    PT Duration  8 weeks    PT Treatment/Interventions  Cryotherapy;Electrical Stimulation;Iontophoresis 19m/ml Dexamethasone;Moist Heat;Ultrasound;Gait training;Stair training;Balance training;Therapeutic exercise;Therapeutic activities;Functional mobility training;Neuromuscular re-education;Patient/family education;Passive range of motion;Dry needling;Taping;Manual techniques    PT Next Visit Plan  DN to Rt hip.  continue flexibility and strengthening in LE; manual therapy / modalities as indicated.    PT Home Exercise Plan  Access Code: [email or web address]   Consulted and Agree with Plan of Care  Patient       Patient will benefit from skilled therapeutic intervention in order to improve the following deficits and impairments:  Pain, Decreased strength, Decreased activity tolerance, Decreased range of motion, Obesity, Difficulty walking  Visit Diagnosis: Chronic pain of right knee  Pain in right hip  Stiffness of right knee, not elsewhere classified     Problem List Patient Active Problem List   Diagnosis Date Noted  . Acute abdominal pain 06/06/2019  . Adult ADHD 03/14/2019  . Excessive daytime sleepiness 03/14/2019  . Pelvic pain in female 11/24/2017  . Hyperlipidemia, mixed 08/16/2017  . Plantar fasciitis, right 07/07/2017  . Primary osteoarthritis of right  knee 11/26/2016  . Obesity 01/30/2016  . Generalized anxiety disorder 12/05/2015  . Chest pain 12/05/2015  .  Hypothyroidism 12/05/2015  . Positive ANA (antinuclear antibody) 12/05/2015   Kerin Perna, PTA 07/11/19 10:03 AM  [place], [place], [address] Phone: [phone]   Fax:  [phone]  Name: Jonne Rote MRN: [id number] Date of Birth: 02-10-1966

## 2019-07-13 ENCOUNTER — Other Ambulatory Visit: Payer: Self-pay | Admitting: *Deleted

## 2019-07-13 ENCOUNTER — Ambulatory Visit (INDEPENDENT_AMBULATORY_CARE_PROVIDER_SITE_OTHER): Payer: BC Managed Care – PPO | Admitting: Rehabilitative and Restorative Service Providers"

## 2019-07-13 ENCOUNTER — Encounter: Payer: Self-pay | Admitting: Rehabilitative and Restorative Service Providers"

## 2019-07-13 ENCOUNTER — Other Ambulatory Visit: Payer: Self-pay

## 2019-07-13 DIAGNOSIS — M25661 Stiffness of right knee, not elsewhere classified: Secondary | ICD-10-CM | POA: Diagnosis not present

## 2019-07-13 DIAGNOSIS — M25551 Pain in right hip: Secondary | ICD-10-CM

## 2019-07-13 DIAGNOSIS — R262 Difficulty in walking, not elsewhere classified: Secondary | ICD-10-CM

## 2019-07-13 DIAGNOSIS — M25561 Pain in right knee: Secondary | ICD-10-CM | POA: Diagnosis not present

## 2019-07-13 DIAGNOSIS — G8929 Other chronic pain: Secondary | ICD-10-CM

## 2019-07-13 DIAGNOSIS — F909 Attention-deficit hyperactivity disorder, unspecified type: Secondary | ICD-10-CM

## 2019-07-13 DIAGNOSIS — M545 Low back pain, unspecified: Secondary | ICD-10-CM

## 2019-07-13 MED ORDER — LISDEXAMFETAMINE DIMESYLATE 20 MG PO CAPS: 20.0000 mg | ORAL_CAPSULE | Freq: Every day | ORAL | 0 refills | Status: DC

## 2019-07-13 NOTE — Therapy (Signed)
[place], [place], [address] Phone: [phone]   Fax:  [phone]  Physical Therapy Treatment  Patient Details  Name: Ann Greene MRN: [id number] Date of Birth: [birth year] Referring Provider (PT): Aundria Mems, MD   Encounter Date: 07/13/2019  PT End of Session - 07/13/19 0803    Visit Number  9    Number of Visits  16    Date for PT Re-Evaluation  08/08/19    Authorization Type  BCBS    PT Start Time  0801    PT Stop Time  0849    PT Time Calculation (min)  48 min    Activity Tolerance  Patient tolerated treatment well       Past Medical History:  Diagnosis Date  . Thyroid disease     Past Surgical History:  Procedure Laterality Date  . CHOLECYSTECTOMY  1996  . TONSILLECTOMY      There were no vitals filed for this visit.  Subjective Assessment - 07/13/19 0806    Subjective  Patient reports that she is making some progress. Pain remains constant but intensity has decreased.    Pain Score  4     Pain Location  Hip    Pain Orientation  Right    Pain Descriptors / Indicators  Aching;Sore    Pain Type  Chronic pain    Pain Onset  More than a month ago    Pain Frequency  Constant                       [facility] Adult PT Treatment/Exercise - 07/13/19 0001      Knee/Hip Exercises: Stretches   Passive Hamstring Stretch  Right;Left;2 reps;20 seconds    Piriformis Stretch  Right;Left;2 reps;20 seconds   modified table     Knee/Hip Exercises: Aerobic   Nustep  L5: 5 min LE only       Moist Heat Therapy   Number Minutes Moist Heat  15 Minutes    Moist Heat Location  Lumbar Spine;Hip      Electrical Stimulation   Electrical Stimulation Location  Rt lumbar to Rt hip     Electrical Stimulation Action  IFC    Electrical Stimulation Parameters  to tolerance    Electrical Stimulation Goals  Pain;Tone      Manual Therapy   Manual Therapy  --   skilled palpation  for dry needling    Manual therapy comments  pt prone     Soft tissue mobilization  deep tissue work through the posterior Rt hip and lumbar spine     Myofascial Release  Rt posterior hip     Passive ROM  IRER Rt LE w/knee flexed pt prone        Trigger Point Dry Needling - 07/13/19 0001    Consent Given?  Yes    Education Handout Provided  Yes    Dry Needling Comments  pt prone Rt side only     Gluteus Medius Response  Palpable increased muscle length    Gluteus Maximus Response  Palpable increased muscle length    Piriformis Response  Palpable increased muscle length    Erector spinae Response  Palpable increased muscle length    Quadratus Lumborum Response  Palpable increased muscle length           PT Education - 07/13/19 0839    Education Details  DN    Person(s) Educated  Patient  Methods  Explanation    Comprehension  Verbalized understanding          PT Long Term Goals - 07/05/19 0917      PT LONG TERM GOAL #1   Title  Pt will be independent in her HEP and progression.    Baseline  initial HEP issued 06/08/19    Time  8    Period  Weeks    Status  On-going      PT LONG TERM GOAL #2   Title  Pt will be able to amb for 20 minutes with pain </= 2/10 in her hip and knee.    Baseline  pain can increase to 8-9/10 when walking longer distances    Time  8    Period  Weeks    Status  On-going      PT LONG TERM GOAL #3   Title  Pt will improve her FOTO score from 51% limitation to </= 39% limitation.    Baseline  51% limiation on 06/08/2019    Time  8    Period  Weeks    Status  On-going      PT LONG TERM GOAL #4   Title  Pt will improve bialteral LE hip strength to >/4+/5 in order to improve funcitonal mobility.    Baseline  see flowsheets    Time  8    Period  Weeks    Status  Partially Met      PT LONG TERM GOAL #5   Title  Pt wil be able to perform functional squat in order to pick up objects off the floor with pain </= 3/10.    Baseline  pain  reported 8/10    Time  8    Period  Weeks    Status  On-going            Plan - 07/13/19 0803    Clinical Impression Statement  Significant deep muscular tightness Rt lumbar to posterior hip - good release with DN and deep tissue work. Patient toleranted DN well. Encouraged myofacial self release work with ball    Comorbidities  ADHD, anxiety, thyroid disease, obesity    Rehab Potential  Good    PT Frequency  2x / week    PT Duration  8 weeks    PT Treatment/Interventions  Cryotherapy;Electrical Stimulation;Iontophoresis '4mg'$ /ml Dexamethasone;Moist Heat;Ultrasound;Gait training;Stair training;Balance training;Therapeutic exercise;Therapeutic activities;Functional mobility training;Neuromuscular re-education;Patient/family education;Passive range of motion;Dry needling;Taping;Manual techniques    PT Next Visit Plan  DN to Rt hip.  continue flexibility and strengthening in LE; manual therapy / modalities as indicated. - assess response to DN and deep tissue work    PT Home Exercise Plan  Access Code: [email or web address]    Consulted and Agree with Plan of Care  Patient       Patient will benefit from skilled therapeutic intervention in order to improve the following deficits and impairments:  Pain, Decreased strength, Decreased activity tolerance, Decreased range of motion, Obesity, Difficulty walking  Visit Diagnosis: Chronic pain of right knee  Pain in right hip  Stiffness of right knee, not elsewhere classified  Difficulty in walking, not elsewhere classified  Chronic midline low back pain without sciatica     Problem List Patient Active Problem List   Diagnosis Date Noted  . Acute abdominal pain 06/06/2019  . Adult ADHD 03/14/2019  . Excessive daytime sleepiness 03/14/2019  . Pelvic pain in female 11/24/2017  . Hyperlipidemia, mixed 08/16/2017  . Plantar fasciitis, right 07/07/2017  .  Primary osteoarthritis of right knee 11/26/2016  . Obesity 01/30/2016  . Generalized  anxiety disorder 12/05/2015  . Chest pain 12/05/2015  . Hypothyroidism 12/05/2015  . Positive ANA (antinuclear antibody) 12/05/2015    Kathryn Cosby Nilda Simmer PT, MPH  07/13/2019, 8:42 AM  [facility] [place], [place], [address] Phone: [phone]   Fax:  [phone]  Name: Ann Greene MRN: [id number] Date of Birth: [birth year]

## 2019-07-13 NOTE — Patient Instructions (Signed)

## 2019-07-14 ENCOUNTER — Other Ambulatory Visit: Payer: Self-pay

## 2019-07-14 ENCOUNTER — Telehealth: Payer: Self-pay

## 2019-07-14 DIAGNOSIS — R6889 Other general symptoms and signs: Secondary | ICD-10-CM | POA: Diagnosis not present

## 2019-07-14 DIAGNOSIS — Z20822 Contact with and (suspected) exposure to covid-19: Secondary | ICD-10-CM

## 2019-07-14 NOTE — Telephone Encounter (Signed)
Ann Greene called and left a message stating she was exposed to COVID-19 by her son 5 days ago. She did have a headache last night but believes it is due to her PT yesterday.

## 2019-07-14 NOTE — Progress Notes (Signed)
no

## 2019-07-16 LAB — NOVEL CORONAVIRUS, NAA: SARS-CoV-2, NAA: NOT DETECTED

## 2019-07-17 ENCOUNTER — Encounter: Payer: BC Managed Care – PPO | Admitting: Physical Therapy

## 2019-07-17 ENCOUNTER — Other Ambulatory Visit: Payer: Self-pay | Admitting: Sports Medicine

## 2019-07-17 DIAGNOSIS — E034 Atrophy of thyroid (acquired): Secondary | ICD-10-CM

## 2019-07-19 ENCOUNTER — Other Ambulatory Visit: Payer: Self-pay

## 2019-07-19 ENCOUNTER — Encounter: Payer: Self-pay | Admitting: Rehabilitative and Restorative Service Providers"

## 2019-07-19 ENCOUNTER — Ambulatory Visit (INDEPENDENT_AMBULATORY_CARE_PROVIDER_SITE_OTHER): Payer: BC Managed Care – PPO | Admitting: Rehabilitative and Restorative Service Providers"

## 2019-07-19 DIAGNOSIS — R262 Difficulty in walking, not elsewhere classified: Secondary | ICD-10-CM | POA: Diagnosis not present

## 2019-07-19 DIAGNOSIS — M25551 Pain in right hip: Secondary | ICD-10-CM

## 2019-07-19 DIAGNOSIS — M25561 Pain in right knee: Secondary | ICD-10-CM

## 2019-07-19 DIAGNOSIS — M545 Low back pain, unspecified: Secondary | ICD-10-CM

## 2019-07-19 DIAGNOSIS — M25661 Stiffness of right knee, not elsewhere classified: Secondary | ICD-10-CM | POA: Diagnosis not present

## 2019-07-19 DIAGNOSIS — G8929 Other chronic pain: Secondary | ICD-10-CM

## 2019-07-19 NOTE — Therapy (Signed)
[place], [place], [address] Phone: [phone]   Fax:  [phone]  Physical Therapy Treatment  Patient Details  Name: Ann Greene MRN: [id number] Date of Birth: December 05, 1965 Referring Provider (PT): Aundria Mems, MD   Encounter Date: 07/19/2019  PT End of Session - 07/19/19 0804    Visit Number  10    Number of Visits  16    Date for PT Re-Evaluation  08/08/19    PT Start Time  0801    PT Stop Time  0849    PT Time Calculation (min)  48 min    Activity Tolerance  Patient tolerated treatment well       Past Medical History:  Diagnosis Date  . Thyroid disease     Past Surgical History:  Procedure Laterality Date  . CHOLECYSTECTOMY  1996  . TONSILLECTOMY      There were no vitals filed for this visit.  Subjective Assessment - 07/19/19 0805    Subjective  Good relief from the DN and manual work. Painted her office at home and was doing a lot of squatting and bending. Now tight and increased pain Rt hip    Currently in Pain?  Yes    Pain Score  3     Pain Location  Hip    Pain Orientation  Right    Pain Descriptors / Indicators  Tender;Tightness    Pain Type  Chronic pain    Pain Onset  More than a month ago    Pain Frequency  Intermittent    Aggravating Factors   proloned standing or sitting    Pain Relieving Factors  stretching; deep tissue work                       [facility] [facility] Adult PT Treatment/Exercise - 07/19/19 0001      Self-Care   Other Self-Care Comments   trial of massage blocks in lumbar spine; hip and occipital area       Therapeutic Activites    Therapeutic Activities  --   myofacial ball release work in standing and supine     Knee/Hip Exercises: Stretches   Passive Hamstring Stretch  Right;Left;2 reps;20 seconds   supine with strap    ITB Stretch  Right;Left;2 reps;20 seconds   supine with strap    Piriformis Stretch  Right;3 reps;30 seconds    PT assist pt supine travell stretch    Piriformis Stretch Limitations  varying angles with PT assist x 2 additional stretches 30 sec hold       Knee/Hip Exercises: Aerobic   Nustep  L5: 10 min LE only       Manual Therapy   Manual therapy comments  pt prone     Joint Mobilization  PA mobs Rt greater trochanter  grade II/III    Soft tissue mobilization  deep tissue work through the posterior Rt hip and lumbar spine     Myofascial Release  Rt posterior hip     Passive ROM  IR/ER Rt LE w/knee flexed pt prone                   PT Long Term Goals - 07/05/19 0917      PT LONG TERM GOAL #1   Title  Pt will be independent in her HEP and progression.    Baseline  initial HEP issued 06/08/19    Time  8    Period  Weeks    Status  On-going      PT LONG TERM GOAL #2   Title  Pt will be able to amb for 20 minutes with pain </= 2/10 in her hip and knee.    Baseline  pain can increase to 8-9/10 when walking longer distances    Time  8    Period  Weeks    Status  On-going      PT LONG TERM GOAL #3   Title  Pt will improve her FOTO score from 51% limitation to </= 39% limitation.    Baseline  51% limiation on 06/08/2019    Time  8    Period  Weeks    Status  On-going      PT LONG TERM GOAL #4   Title  Pt will improve bialteral LE hip strength to >/4+/5 in order to improve funcitonal mobility.    Baseline  see flowsheets    Time  8    Period  Weeks    Status  Partially Met      PT LONG TERM GOAL #5   Title  Pt wil be able to perform functional squat in order to pick up objects off the floor with pain </= 3/10.    Baseline  pain reported 8/10    Time  8    Period  Weeks    Status  On-going            Plan - 07/19/19 0805    Clinical Impression Statement  Good response to DN and deep tissue work. Increased symptoms with painting at home. Continued significant deep muscular tightness Rt lumbar and posterior hip musculature.    Comorbidities  ADHD, anxiety, thyroid  disease, obesity    Rehab Potential  Good    PT Frequency  2x / week    PT Duration  8 weeks    PT Treatment/Interventions  Cryotherapy;Electrical Stimulation;Iontophoresis 83m/ml Dexamethasone;Moist Heat;Ultrasound;Gait training;Stair training;Balance training;Therapeutic exercise;Therapeutic activities;Functional mobility training;Neuromuscular re-education;Patient/family education;Passive range of motion;Dry needling;Taping;Manual techniques    PT Next Visit Plan  DN and deep tissue work to Rt hip.and lumbar musculature  continue flexibility and strengthening in LE; manual therapy / modalities as indicated. -    PT Home Exercise Plan  Access Code: [email or web address]   Consulted and Agree with Plan of Care  Patient       Patient will benefit from skilled therapeutic intervention in order to improve the following deficits and impairments:  Pain, Decreased strength, Decreased activity tolerance, Decreased range of motion, Obesity, Difficulty walking  Visit Diagnosis: Chronic pain of right knee  Pain in right hip  Stiffness of right knee, not elsewhere classified  Difficulty in walking, not elsewhere classified  Chronic midline low back pain without sciatica     Problem List Patient Active Problem List   Diagnosis Date Noted  . Acute abdominal pain 06/06/2019  . Adult ADHD 03/14/2019  . Excessive daytime sleepiness 03/14/2019  . Pelvic pain in female 11/24/2017  . Hyperlipidemia, mixed 08/16/2017  . Plantar fasciitis, right 07/07/2017  . Primary osteoarthritis of right knee 11/26/2016  . Obesity 01/30/2016  . Generalized anxiety disorder 12/05/2015  . Chest pain 12/05/2015  . Hypothyroidism 12/05/2015  . Positive ANA (antinuclear antibody) 12/05/2015    Ann Greene PNilda SimmerPT, MPH  07/19/2019, 8:50 AM  [facility] [address]Phone: [phone]  Fax:  [phone] Name: MMadelin WesemanMRN:  [id number]  Date of Birth: [birth year]

## 2019-07-24 ENCOUNTER — Other Ambulatory Visit: Payer: Self-pay

## 2019-07-24 ENCOUNTER — Ambulatory Visit (INDEPENDENT_AMBULATORY_CARE_PROVIDER_SITE_OTHER): Payer: BC Managed Care – PPO | Admitting: Rehabilitative and Restorative Service Providers"

## 2019-07-24 ENCOUNTER — Encounter: Payer: Self-pay | Admitting: Rehabilitative and Restorative Service Providers"

## 2019-07-24 DIAGNOSIS — M25661 Stiffness of right knee, not elsewhere classified: Secondary | ICD-10-CM | POA: Diagnosis not present

## 2019-07-24 DIAGNOSIS — R262 Difficulty in walking, not elsewhere classified: Secondary | ICD-10-CM

## 2019-07-24 DIAGNOSIS — M545 Low back pain: Secondary | ICD-10-CM

## 2019-07-24 DIAGNOSIS — M25551 Pain in right hip: Secondary | ICD-10-CM

## 2019-07-24 DIAGNOSIS — M25561 Pain in right knee: Secondary | ICD-10-CM | POA: Diagnosis not present

## 2019-07-24 DIAGNOSIS — G8929 Other chronic pain: Secondary | ICD-10-CM

## 2019-07-24 NOTE — Therapy (Signed)
[place], [place], [address] Phone: [phone]   Fax:  [phone]  Physical Therapy Treatment  Patient Details  Name: Ann Greene MRN: [id number] Date of Birth: 02-13-1966 Referring Provider (PT): Aundria Mems, MD   Encounter Date: 07/24/2019  PT End of Session - 07/24/19 0813    Visit Number  11    Number of Visits  16    Date for PT Re-Evaluation  08/08/19    PT Start Time  0809    PT Stop Time  0845    PT Time Calculation (min)  36 min    Activity Tolerance  Patient tolerated treatment well       Past Medical History:  Diagnosis Date  . Thyroid disease     Past Surgical History:  Procedure Laterality Date  . CHOLECYSTECTOMY  1996  . TONSILLECTOMY      There were no vitals filed for this visit.  Subjective Assessment - 07/24/19 0814    Subjective  Improving - walked for 45 min this weekend - sore but but not painful. The most she has walked in a long time. More mobility in hip. Making progress    Currently in Pain?  Yes    Pain Score  4     Pain Location  Hip    Pain Orientation  Right    Pain Descriptors / Indicators  Tender;Tightness    Pain Type  Chronic pain    Pain Radiating Towards  LB to butt to upper leg    Pain Onset  More than a month ago    Pain Frequency  Intermittent         [facility] PT Assessment - 07/24/19 0001      Assessment   Medical Diagnosis  M17.11 - OA R knee, pain in R hip and thoracic spine    Referring Provider (PT)  Aundria Mems, MD    Hand Dominance  Right    Next MD Visit  f/u after therapy    Prior Therapy  knee pain years ago      AROM   Right Knee Extension  0    Right Knee Flexion  125    Left Knee Extension  0    Left Knee Flexion  126                   [facility] Adult PT Treatment/Exercise - 07/24/19 0001      Self-Care   Other Self-Care Comments   massage blocks in lumbar spine; hip and occipital area        Therapeutic Activites    Therapeutic Activities  --   myofacial ball release work in standing and supine     Knee/Hip Exercises: Stretches   ITB Stretch  Right;Left;2 reps;20 seconds   supine with strap    Piriformis Stretch  Right;3 reps;30 seconds   PT assist pt supine travell stretch    Piriformis Stretch Limitations  varying angles with PT assist x 2 additional stretches 30 sec hold       Knee/Hip Exercises: Aerobic   Nustep  L6: 6 min LE only       Knee/Hip Exercises: Supine   Other Supine Knee/Hip Exercises  pelvic tilt 10 sec x 5       Manual Therapy   Manual therapy comments  pt prone     Joint Mobilization  PA mobs Rt greater trochanter  grade II/III    Soft tissue mobilization  deep tissue work through the posterior Rt hip and lumbar spine     Myofascial Release  Rt posterior hip     Passive ROM  IR/ER Rt LE w/knee flexed pt prone        Trigger Point Dry Needling - 07/24/19 0001    Consent Given?  Yes    Education Handout Provided  Previously provided    Dry Needling Comments  pt prone Rt side only     Gluteus Medius Response  Palpable increased muscle length    Gluteus Maximus Response  Palpable increased muscle length    Piriformis Response  Palpable increased muscle length                PT Long Term Goals - 07/05/19 0917      PT LONG TERM GOAL #1   Title  Pt will be independent in her HEP and progression.    Baseline  initial HEP issued 06/08/19    Time  8    Period  Weeks    Status  On-going      PT LONG TERM GOAL #2   Title  Pt will be able to amb for 20 minutes with pain </= 2/10 in her hip and knee.    Baseline  pain can increase to 8-9/10 when walking longer distances    Time  8    Period  Weeks    Status  On-going      PT LONG TERM GOAL #3   Title  Pt will improve her FOTO score from 51% limitation to </= 39% limitation.    Baseline  51% limiation on 06/08/2019    Time  8    Period  Weeks    Status  On-going      PT LONG TERM  GOAL #4   Title  Pt will improve bialteral LE hip strength to >/4+/5 in order to improve funcitonal mobility.    Baseline  see flowsheets    Time  8    Period  Weeks    Status  Partially Met      PT LONG TERM GOAL #5   Title  Pt wil be able to perform functional squat in order to pick up objects off the floor with pain </= 3/10.    Baseline  pain reported 8/10    Time  8    Period  Weeks    Status  On-going            Plan - 07/24/19 1941    Clinical Impression Statement  Progresing with rehab. Patient reports increased activity with decreased pain. Continues to have palpable tightness through the Rt posterior hip and lumbar area. Good response to DN, manual work and stretching.    Comorbidities  ADHD, anxiety, thyroid disease, obesity    Rehab Potential  Good    PT Frequency  2x / week    PT Duration  8 weeks    PT Treatment/Interventions  Cryotherapy;Electrical Stimulation;Iontophoresis 61m/ml Dexamethasone;Moist Heat;Ultrasound;Gait training;Stair training;Balance training;Therapeutic exercise;Therapeutic activities;Functional mobility training;Neuromuscular re-education;Patient/family education;Passive range of motion;Dry needling;Taping;Manual techniques    PT Next Visit Plan  DN and deep tissue work to Rt hip.and lumbar musculature  continue flexibility and strengthening in LE; manual therapy / modalities as indicated. -    PT Home Exercise Plan  Access Code: [email or web address]   Consulted and Agree with Plan of Care  Patient       Patient will benefit from skilled therapeutic intervention in order to  improve the following deficits and impairments:  Pain, Decreased strength, Decreased activity tolerance, Decreased range of motion, Obesity, Difficulty walking  Visit Diagnosis: Chronic pain of right knee  Pain in right hip  Stiffness of right knee, not elsewhere classified  Difficulty in walking, not elsewhere classified  Chronic midline low back pain without  sciatica     Problem List Patient Active Problem List   Diagnosis Date Noted  . Acute abdominal pain 06/06/2019  . Adult ADHD 03/14/2019  . Excessive daytime sleepiness 03/14/2019  . Pelvic pain in female 11/24/2017  . Hyperlipidemia, mixed 08/16/2017  . Plantar fasciitis, right 07/07/2017  . Primary osteoarthritis of right knee 11/26/2016  . Obesity 01/30/2016  . Generalized anxiety disorder 12/05/2015  . Chest pain 12/05/2015  . Hypothyroidism 12/05/2015  . Positive ANA (antinuclear antibody) 12/05/2015    Celyn Nilda Simmer PT, MPH  07/24/2019, 10:04 AM  [facility] [place], [place], [address] Phone: [phone]   Fax:  [phone]  Name: Vallory Oetken MRN: [id number] Date of Birth: 1966/10/29

## 2019-07-26 ENCOUNTER — Encounter: Payer: BC Managed Care – PPO | Admitting: Physical Therapy

## 2019-08-02 ENCOUNTER — Encounter: Payer: Self-pay | Admitting: Physical Therapy

## 2019-08-02 ENCOUNTER — Ambulatory Visit (INDEPENDENT_AMBULATORY_CARE_PROVIDER_SITE_OTHER): Payer: BC Managed Care – PPO | Admitting: Physical Therapy

## 2019-08-02 ENCOUNTER — Other Ambulatory Visit: Payer: Self-pay

## 2019-08-02 DIAGNOSIS — M25551 Pain in right hip: Secondary | ICD-10-CM | POA: Diagnosis not present

## 2019-08-02 DIAGNOSIS — G8929 Other chronic pain: Secondary | ICD-10-CM

## 2019-08-02 DIAGNOSIS — M25561 Pain in right knee: Secondary | ICD-10-CM | POA: Diagnosis not present

## 2019-08-02 DIAGNOSIS — M25661 Stiffness of right knee, not elsewhere classified: Secondary | ICD-10-CM

## 2019-08-02 NOTE — Therapy (Addendum)
[place], [place], [address] Phone: [phone]   Fax:  [phone]  Physical Therapy Treatment  Patient Details  Name: Ann Greene MRN: [id number] Date of Birth: Nov 22, 1965 Referring Provider (PT): Aundria Mems, MD   Encounter Date: 08/02/2019  PT End of Session - 08/02/19 0810    Visit Number  12    Number of Visits  16    Date for PT Re-Evaluation  08/08/19    Authorization Type  [employer]    PT Start Time  0808   pt arrived late   PT Stop Time  0843    PT Time Calculation (min)  35 min       Past Medical History:  Diagnosis Date  . Thyroid disease     Past Surgical History:  Procedure Laterality Date  . CHOLECYSTECTOMY  1996  . TONSILLECTOMY      There were no vitals filed for this visit.  Subjective Assessment - 08/02/19 0808    Subjective  Pt reports she had been doing well until a few days ago.  "I think I may have overdone it".  she tried walk/jog last week and also had 5k (walking) on Saturday.  Also fell in a busted chair this weekend.    Currently in Pain?  Yes    Pain Score  8     Pain Location  Back    Pain Orientation  Right;Left    Pain Descriptors / Indicators  Tightness;Sore    Pain Radiating Towards  LB to hip    Aggravating Factors   walking hills;  prolonged standing and sitting    Pain Relieving Factors  stretching; STM         [facility] PT Assessment - 08/02/19 0001      Assessment   Medical Diagnosis  M17.11 - OA R knee, pain in R hip and thoracic spine    Referring Provider (PT)  Aundria Mems, MD    Hand Dominance  Right    Next MD Visit  f/u after therapy    Prior Therapy  knee pain years ago      Observation/Other Assessments   Focus on Therapeutic Outcomes (FOTO)   33% limitation       Strength   Right Hip Flexion  5/5    Right Hip Extension  4+/5    Right Hip ABduction  5/5    Right Hip ADduction  4+/5    Left Hip Flexion  5/5    Left Hip Extension  5/5    Left Hip ABduction  5/5    Left Hip ADduction  4+/5    Left Knee Flexion  5/5    Left Knee Extension  5/5       [facility] Adult PT Treatment/Exercise - 08/02/19 0001      Knee/Hip Exercises: Stretches   Hip Flexor Stretch  Right;Left;2 reps;30 seconds   seated   Hip Flexor Stretch Limitations  cues to tuck pelvis under for quad stretch     Other Knee/Hip Stretches  modified childs pose, rolling table out (hands on table ) x 15 sec x 3 reps,       Knee/Hip Exercises: Aerobic   Nustep  L5: 7.5 min        Knee/Hip Exercises: Prone   Other Prone Exercises  opp arm/leg x 10 reps each side    Other Prone Exercises  quadruped:  cat cow x5 reps, childs pose x 10 sec  Modalities   Modalities  --   declined; will do at home.      Manual Therapy   Manual therapy comments  pt prone     Soft tissue mobilization  deep tissue work through the posterior Rt hip and lumbar spine     Myofascial Release  Rt posterior hip  And low back   Passive ROM  IR/ER Rt LE w/knee flexed pt prone         PT Long Term Goals - 08/02/19 1304      PT LONG TERM GOAL #1   Title  Pt will be independent in her HEP and progression.    Baseline  initial HEP issued 06/08/19    Time  8    Period  Weeks    Status  On-going      PT LONG TERM GOAL #2   Title  Pt will be able to amb for 20 minutes with pain </= 2/10 in her hip and knee.    Time  8    Period  Weeks    Status  Partially Met      PT LONG TERM GOAL #3   Title  Pt will improve her FOTO score from 51% limitation to </= 39% limitation.    Baseline  51% limiation on 06/08/2019    Time  8    Period  Weeks    Status  Achieved      PT LONG TERM GOAL #4   Title  Pt will improve bialteral LE hip strength to >/4+/5 in order to improve funcitonal mobility.    Baseline  see flowsheets    Time  8    Period  Weeks    Status  Achieved      PT LONG TERM GOAL #5   Title  Pt wil be able to perform functional squat in order to pick  up objects off the floor with pain </= 3/10.    Baseline  pain reported 8/10    Time  8    Period  Weeks    Status  Achieved            Plan - 08/02/19 1036    Clinical Impression Statement  Pt reporting overall improvement in functional mobility with less pain.  Pt reported reduction of pain to 1/10 at end of session. Pt pleased with progress so far and has met most of her goals. She verbalized readiness to hold therapy while she continues work on [employer].    Comorbidities  ADHD, anxiety, thyroid disease, obesity    Rehab Potential  Good    PT Frequency  2x / week    PT Duration  8 weeks    PT Treatment/Interventions  Cryotherapy;Electrical Stimulation;Iontophoresis '4mg'$ /ml Dexamethasone;Moist Heat;Ultrasound;Gait training;Stair training;Balance training;Therapeutic exercise;Therapeutic activities;Functional mobility training;Neuromuscular re-education;Patient/family education;Passive range of motion;Dry needling;Taping;Manual techniques    PT Next Visit Plan  hold therapy until 09/01/19.    PT Home Exercise Plan  Access Code: [email or web address]    Consulted and Agree with Plan of Care  Patient       Patient will benefit from skilled therapeutic intervention in order to improve the following deficits and impairments:  Pain, Decreased strength, Decreased activity tolerance, Decreased range of motion, Obesity, Difficulty walking  Visit Diagnosis: Chronic pain of right knee  Pain in right hip  Stiffness of right knee, not elsewhere classified     Problem List Patient Active Problem List   Diagnosis Date Noted  . Acute abdominal pain 06/06/2019  .  Adult ADHD 03/14/2019  . Excessive daytime sleepiness 03/14/2019  . Pelvic pain in female 11/24/2017  . Hyperlipidemia, mixed 08/16/2017  . Plantar fasciitis, right 07/07/2017  . Primary osteoarthritis of right knee 11/26/2016  . Obesity 01/30/2016  . Generalized anxiety disorder 12/05/2015  . Chest pain 12/05/2015  . Hypothyroidism  12/05/2015  . Positive ANA (antinuclear antibody) 12/05/2015   Kerin Perna, PTA 08/02/19 1:05 PM  [address] [place] [address] [place]  [place], [place], [address] Phone: [phone]   Fax:  [phone]  Name: Ann Greene MRN: [id number] Date of Birth: June 10, 1966  PHYSICAL THERAPY DISCHARGE SUMMARY  Visits from Start of Care: 12  Current functional level related to goals / functional outcomes: See last progress note for discharge status    Remaining deficits: Unknown    Education / Equipment: HEP  Plan: Patient agrees to discharge.  Patient goals were partially met. Patient is being discharged due to being pleased with the current functional level.  ?????    Celyn P. Helene Kelp PT, MPH 12/22/19 10:40 AM

## 2019-08-04 ENCOUNTER — Encounter: Payer: BC Managed Care – PPO | Admitting: Physical Therapy

## 2019-08-11 ENCOUNTER — Other Ambulatory Visit: Payer: Self-pay | Admitting: *Deleted

## 2019-08-11 DIAGNOSIS — F909 Attention-deficit hyperactivity disorder, unspecified type: Secondary | ICD-10-CM

## 2019-08-11 MED ORDER — LISDEXAMFETAMINE DIMESYLATE 20 MG PO CAPS: 20.0000 mg | ORAL_CAPSULE | Freq: Every day | ORAL | 0 refills | Status: DC

## 2019-08-11 NOTE — Telephone Encounter (Signed)
Pt called and would like an order to do Cologuard and she also needs a refill on her Vyvanse.

## 2019-09-06 ENCOUNTER — Other Ambulatory Visit: Payer: Self-pay | Admitting: Sports Medicine

## 2019-09-06 DIAGNOSIS — F411 Generalized anxiety disorder: Secondary | ICD-10-CM

## 2019-09-06 NOTE — Telephone Encounter (Signed)
Needs appointment

## 2019-09-13 ENCOUNTER — Other Ambulatory Visit: Payer: Self-pay | Admitting: *Deleted

## 2019-09-13 DIAGNOSIS — F909 Attention-deficit hyperactivity disorder, unspecified type: Secondary | ICD-10-CM

## 2019-09-13 MED ORDER — LISDEXAMFETAMINE DIMESYLATE 20 MG PO CAPS: 20.0000 mg | ORAL_CAPSULE | Freq: Every day | ORAL | 0 refills | Status: DC

## 2019-10-06 ENCOUNTER — Other Ambulatory Visit: Payer: Self-pay | Admitting: Sports Medicine

## 2019-10-06 DIAGNOSIS — F411 Generalized anxiety disorder: Secondary | ICD-10-CM

## 2019-10-12 ENCOUNTER — Other Ambulatory Visit: Payer: Self-pay | Admitting: *Deleted

## 2019-10-12 DIAGNOSIS — F909 Attention-deficit hyperactivity disorder, unspecified type: Secondary | ICD-10-CM

## 2019-10-12 MED ORDER — LISDEXAMFETAMINE DIMESYLATE 20 MG PO CAPS: 20.0000 mg | ORAL_CAPSULE | Freq: Every day | ORAL | 0 refills | Status: DC

## 2019-10-12 NOTE — Telephone Encounter (Signed)
Pt left vm requesting a refill on her Vyvanse.  She also wanted to know if a 3 month supply could be sent in. Pt also stated that she's been experiencing some fatigue, headache, & sore throat for a few days now.  She said that she's feeling a little better today but his white spots on her throat.  Will have front office call her to get her scheduled for a virtual.

## 2019-10-13 ENCOUNTER — Ambulatory Visit (INDEPENDENT_AMBULATORY_CARE_PROVIDER_SITE_OTHER): Payer: BC Managed Care – PPO | Admitting: Physician Assistant

## 2019-10-13 ENCOUNTER — Telehealth: Payer: Self-pay | Admitting: Neurology

## 2019-10-13 VITALS — HR 82 | Temp 97.8°F | Ht 68.5 in | Wt 298.0 lb

## 2019-10-13 DIAGNOSIS — Z1211 Encounter for screening for malignant neoplasm of colon: Secondary | ICD-10-CM

## 2019-10-13 DIAGNOSIS — R300 Dysuria: Secondary | ICD-10-CM

## 2019-10-13 DIAGNOSIS — J039 Acute tonsillitis, unspecified: Secondary | ICD-10-CM | POA: Diagnosis not present

## 2019-10-13 MED ORDER — AMOXICILLIN-POT CLAVULANATE 875-125 MG PO TABS: 1.0000 | ORAL_TABLET | Freq: Two times a day (BID) | ORAL | 0 refills | Status: DC

## 2019-10-13 NOTE — Telephone Encounter (Signed)
Cologuard order faxed to [phone] with confirmation received. They will contact the patient directly.   

## 2019-10-13 NOTE — Progress Notes (Deleted)
One week hx/worst Monday and Tuesday:  Sore throat Body aches Fatigue White spots on back of throat Headache  No change in taste/smell, no cough, no congestion  Using humidifier, no OTC meds  Urinary urgency and dysuria started today - drinking water and cranberry juice

## 2019-10-16 ENCOUNTER — Encounter: Payer: Self-pay | Admitting: Physician Assistant

## 2019-10-16 NOTE — Progress Notes (Signed)
Patient ID: Ann Greene, female   DOB: [birth year], 53 y.o.   MRN: [id number] .Marland KitchenVirtual Visit via Video Note  I connected with Ann Greene on 10/13/19 at  1:00 PM EST by a video enabled telemedicine application and verified that I am speaking with the correct person using two identifiers.  Location: Patient: home Provider: clinic   I discussed the limitations of evaluation and management by telemedicine and the availability of in person appointments. The patient expressed understanding and agreed to proceed.  History of Present Illness: Patient is a 53 year old female who calls into the clinic with 1 week of sore throat, body aches, fatigue, headache.  She felt like symptoms were worse earlier in the week and then got a little better.  She feels worse today.  She woke up and noticed the white spots on the back of her throat and more difficulty swallowing.  She denies any change of taste and smell, cough, shortness of breath, congestion.  She is using a humidifier.  She has not taken any over-the-counter medications.  She has continued to go to work. No fever.   She also reports urinary urgency and dysuria starting this morning.  She is drinking water and cranberry juice.  Per patient never sent cologuard kit. Requesting one to be sent.   .. Active Ambulatory Problems    Diagnosis Date Noted  . Generalized anxiety disorder 12/05/2015  . Chest pain 12/05/2015  . Hypothyroidism 12/05/2015  . Positive ANA (antinuclear antibody) 12/05/2015  . Obesity 01/30/2016  . Primary osteoarthritis of right knee 11/26/2016  . Plantar fasciitis, right 07/07/2017  . Hyperlipidemia, mixed 08/16/2017  . Pelvic pain in female 11/24/2017  . Adult ADHD 03/14/2019  . Excessive daytime sleepiness 03/14/2019  . Acute abdominal pain 06/06/2019   Resolved Ambulatory Problems    Diagnosis Date Noted  . Annual physical exam 12/05/2015  . Ischial bursitis, right 11/26/2016  . Cystitis 04/06/2017   Past Medical  History:  Diagnosis Date  . Thyroid disease    Reviewed med, allergy, problem list.    Observations/Objective: No acute distress.  Normal breathing. No cough or wheezing.  Not able to visualize throat. Tonsils did appear enlarged and erythematous.   .. Today's Vitals   10/13/19 1145  Pulse: 82  Temp: 97.8 F (36.6 C)  TempSrc: Oral  Weight: 298 lb (135.2 kg)  Height: 5' 8.5" (1.74 m)   Body mass index is 44.65 kg/m.    Assessment and Plan: Marland KitchenMarland KitchenCleva was seen today for sore throat.  Diagnoses and all orders for this visit:  Acute tonsillitis, unspecified etiology -     amoxicillin-clavulanate (AUGMENTIN) 875-125 MG tablet; Take 1 tablet by mouth 2 (two) times daily.  Colon cancer screening -     Cologuard  Dysuria -     amoxicillin-clavulanate (AUGMENTIN) 875-125 MG tablet; Take 1 tablet by mouth 2 (two) times daily.   Not able to get urine sample due to virtual appt. Concerning for bacterial tonsillitis. Treated with augmentin to cover both tonsillitis and cystitis. If either problem persist need to come in for office visit after being covid tested. Pt symptoms do not sound like classic covid and last over a week. She has not been tested. Recommended self isolation and testing.   cologuard ordered printed.    Follow Up Instructions:    I discussed the assessment and treatment plan with the patient. The patient was provided an opportunity to ask questions and all were answered. The patient agreed with the plan and  demonstrated an understanding of the instructions.   The patient was advised to call back or seek an in-person evaluation if the symptoms worsen or if the condition fails to improve as anticipated.     Iran Planas, PA-C

## 2019-11-13 ENCOUNTER — Other Ambulatory Visit: Payer: Self-pay | Admitting: *Deleted

## 2019-11-13 DIAGNOSIS — F909 Attention-deficit hyperactivity disorder, unspecified type: Secondary | ICD-10-CM

## 2019-11-13 MED ORDER — LISDEXAMFETAMINE DIMESYLATE 20 MG PO CAPS: 20.0000 mg | ORAL_CAPSULE | Freq: Every day | ORAL | 0 refills | Status: DC

## 2019-12-12 ENCOUNTER — Other Ambulatory Visit: Payer: Self-pay | Admitting: *Deleted

## 2019-12-12 DIAGNOSIS — F909 Attention-deficit hyperactivity disorder, unspecified type: Secondary | ICD-10-CM

## 2019-12-13 MED ORDER — LISDEXAMFETAMINE DIMESYLATE 20 MG PO CAPS: 20.0000 mg | ORAL_CAPSULE | Freq: Every day | ORAL | 0 refills | Status: DC

## 2020-01-08 ENCOUNTER — Other Ambulatory Visit: Payer: Self-pay | Admitting: Sports Medicine

## 2020-01-08 DIAGNOSIS — E034 Atrophy of thyroid (acquired): Secondary | ICD-10-CM

## 2020-01-12 ENCOUNTER — Other Ambulatory Visit: Payer: Self-pay

## 2020-01-12 DIAGNOSIS — F909 Attention-deficit hyperactivity disorder, unspecified type: Secondary | ICD-10-CM

## 2020-01-12 MED ORDER — LISDEXAMFETAMINE DIMESYLATE 20 MG PO CAPS: 20.0000 mg | ORAL_CAPSULE | Freq: Every day | ORAL | 0 refills | Status: DC

## 2020-01-12 NOTE — Telephone Encounter (Addendum)
Ann Greene is requesting a refill on Vyvanse. Transferred to the front to schedule a follow up appointment. I checked and she did not schedule a follow up.

## 2020-01-19 DIAGNOSIS — D485 Neoplasm of uncertain behavior of skin: Secondary | ICD-10-CM | POA: Diagnosis not present

## 2020-01-19 DIAGNOSIS — D1801 Hemangioma of skin and subcutaneous tissue: Secondary | ICD-10-CM | POA: Diagnosis not present

## 2020-01-19 DIAGNOSIS — L814 Other melanin hyperpigmentation: Secondary | ICD-10-CM | POA: Diagnosis not present

## 2020-01-19 DIAGNOSIS — D225 Melanocytic nevi of trunk: Secondary | ICD-10-CM | POA: Diagnosis not present

## 2020-01-29 ENCOUNTER — Other Ambulatory Visit: Payer: Self-pay

## 2020-01-29 ENCOUNTER — Ambulatory Visit: Payer: BC Managed Care – PPO | Admitting: Sports Medicine

## 2020-01-29 ENCOUNTER — Encounter: Payer: Self-pay | Admitting: *Deleted

## 2020-01-29 DIAGNOSIS — R002 Palpitations: Secondary | ICD-10-CM | POA: Diagnosis not present

## 2020-01-29 DIAGNOSIS — E034 Atrophy of thyroid (acquired): Secondary | ICD-10-CM | POA: Diagnosis not present

## 2020-01-29 DIAGNOSIS — F909 Attention-deficit hyperactivity disorder, unspecified type: Secondary | ICD-10-CM

## 2020-01-29 DIAGNOSIS — I471 Supraventricular tachycardia, unspecified: Secondary | ICD-10-CM | POA: Insufficient documentation

## 2020-01-29 LAB — COMPLETE METABOLIC PANEL WITH GFR
AG Ratio: 1.8 (calc) (ref 1.0–2.5)
ALT: 12 U/L (ref 6–29)
AST: 15 U/L (ref 10–35)
Albumin: 4.1 g/dL (ref 3.6–5.1)
Alkaline phosphatase (APISO): 85 U/L (ref 37–153)
BUN: 9 mg/dL (ref 7–25)
CO2: 28 mmol/L (ref 20–32)
Calcium: 9.2 mg/dL (ref 8.6–10.4)
Chloride: 104 mmol/L (ref 98–110)
Creat: 0.54 mg/dL (ref 0.50–1.05)
GFR, Est African American: 126 mL/min/{1.73_m2} (ref >=60)
GFR, Est Non African American: 108 mL/min/{1.73_m2} (ref >=60)
Globulin: 2.3 g/dL (calc) (ref 1.9–3.7)
Glucose, Bld: 104 mg/dL (ref 65–139)
Potassium: 4.6 mmol/L (ref 3.5–5.3)
Sodium: 138 mmol/L (ref 135–146)
Total Bilirubin: 0.4 mg/dL (ref 0.2–1.2)
Total Protein: 6.4 g/dL (ref 6.1–8.1)

## 2020-01-29 LAB — CBC
HCT: 41.4 % (ref 35.0–45.0)
Hemoglobin: 13.5 g/dL (ref 11.7–15.5)
MCH: 27.1 pg (ref 27.0–33.0)
MCHC: 32.6 g/dL (ref 32.0–36.0)
MCV: 83.1 fL (ref 80.0–100.0)
MPV: 8.8 fL (ref 7.5–12.5)
Platelets: 437 10*3/uL — ABNORMAL HIGH (ref 140–400)
RBC: 4.98 10*6/uL (ref 3.80–5.10)
RDW: 13.3 % (ref 11.0–15.0)
WBC: 9 10*3/uL (ref 3.8–10.8)

## 2020-01-29 LAB — CK TOTAL AND CKMB (NOT AT ARMC)
CK, MB: 0.7 ng/mL (ref 0–5.0)
Relative Index: 1.7 (ref 0–4.0)
Total CK: 41 U/L (ref 29–143)

## 2020-01-29 LAB — TSH: TSH: 0.21 mIU/L — ABNORMAL LOW

## 2020-01-29 LAB — TROPONIN I: Troponin I: <0.01 ng/mL (ref <=0.0)

## 2020-01-29 LAB — T3, FREE: T3, Free: 3.2 pg/mL (ref 2.3–4.2)

## 2020-01-29 LAB — T4, FREE: Free T4: 1.5 ng/dL (ref 0.8–1.8)

## 2020-01-29 MED ORDER — LEVOTHYROXINE SODIUM 150 MCG PO TABS: 150.0000 ug | ORAL_TABLET | Freq: Every day | ORAL | 3 refills | Status: DC

## 2020-01-29 NOTE — Progress Notes (Signed)
Patient ID: Ann Greene, female   DOB: 01-29-1966, 54 y.o.   MRN: [id number] Patient was enrolled for [employer] to mail a 14 day ZIO XT long term holter monitor to her home.  Instructions sent to patient via My Chart message, and will also be included in her monitor kit.

## 2020-01-29 NOTE — Progress Notes (Addendum)
    Procedures performed today:    Twelve-lead ECG performed and interpreted by me today.  ECG shows normal sinus rhythm with a rate of 75 bpm, normal axis, no ST changes, no PR changes, good R wave progression.  Normal QTC.  No delta wave.  Independent interpretation of notes and tests performed by another provider:   None.  Brief History, Exam, Impression, and Recommendations:    Palpitations Ann Greene returns, she is a pleasant 54 year old female, she had an episode of palpitations while on an interview.  She looked at her apple watch and noted a heart rate of 200. She did feel a little dizzy at the time, no chest pain, nausea, diaphoresis. This resolved on its own. Of note she did have a stress test in 2017 that was negative for ischemia. Since then she had a few episodes of palpitations. Restart the work-up with a 12-lead ECG, referral for long-term event monitor, labs including electrolytes, cardiac enzymes, thyroid function. Discontinue Vyvanse.   Adult ADHD Ann Greene's ADHD is well controlled, unfortunately with palpitations we need to discontinue her Vyvanse, she has learned some good coping strategies and work around so she feels as though she will be okay without it. Her blood pressure has been a little elevated so this would certainly help.  Hypothyroidism TSH is slightly low indicating overtreatment, I am going to go ahead and decrease levothyroxine dose, recheck TSH in 6 weeks.  No change in plan with regards to the other work-up.  Other labs also look great.    ___________________________________________ Ann Greene. Ann Greene, M.D., ABFM., CAQSM. Primary Care and Sports Medicine [facility]  Adjunct Instructor of Family Medicine  [employer]

## 2020-01-29 NOTE — Addendum Note (Signed)
Addended by: Donne Anon L on: 01/29/2020 12:00 PM   Modules accepted: Orders

## 2020-01-29 NOTE — Assessment & Plan Note (Signed)
Ann Greene's ADHD is well controlled, unfortunately with palpitations we need to discontinue her Vyvanse, she has learned some good coping strategies and work around so she feels as though she will be okay without it. Her blood pressure has been a little elevated so this would certainly help.

## 2020-01-29 NOTE — Addendum Note (Signed)
Addended by: Monica Becton on: 01/29/2020 05:31 PM   Modules accepted: Orders

## 2020-01-29 NOTE — Assessment & Plan Note (Addendum)
Ann Greene returns, she is a pleasant 54 year old female, she had an episode of palpitations while on an interview.  She looked at her apple watch and noted a heart rate of 200. She did feel a little dizzy at the time, no chest pain, nausea, diaphoresis. This resolved on its own. Of note she did have a stress test in 2017 that was negative for ischemia. Since then she had a few episodes of palpitations. Restart the work-up with a 12-lead ECG, referral for long-term event monitor, labs including electrolytes, cardiac enzymes, thyroid function. Discontinue Vyvanse.

## 2020-01-29 NOTE — Assessment & Plan Note (Signed)
TSH is slightly low indicating overtreatment, I am going to go ahead and decrease levothyroxine dose, recheck TSH in 6 weeks.  No change in plan with regards to the other work-up.  Other labs also look great.

## 2020-01-30 ENCOUNTER — Other Ambulatory Visit: Payer: Self-pay | Admitting: Neurology

## 2020-01-30 DIAGNOSIS — E034 Atrophy of thyroid (acquired): Secondary | ICD-10-CM

## 2020-02-01 ENCOUNTER — Ambulatory Visit (INDEPENDENT_AMBULATORY_CARE_PROVIDER_SITE_OTHER): Payer: BC Managed Care – PPO

## 2020-02-01 DIAGNOSIS — R002 Palpitations: Secondary | ICD-10-CM

## 2020-02-19 DIAGNOSIS — Z1211 Encounter for screening for malignant neoplasm of colon: Secondary | ICD-10-CM | POA: Diagnosis not present

## 2020-02-20 DIAGNOSIS — R002 Palpitations: Secondary | ICD-10-CM | POA: Diagnosis not present

## 2020-02-22 ENCOUNTER — Ambulatory Visit: Payer: BC Managed Care – PPO | Admitting: Internal Medicine

## 2020-02-22 ENCOUNTER — Other Ambulatory Visit: Payer: Self-pay

## 2020-02-22 ENCOUNTER — Encounter: Payer: Self-pay | Admitting: Internal Medicine

## 2020-02-22 ENCOUNTER — Telehealth: Payer: Self-pay | Admitting: *Deleted

## 2020-02-22 VITALS — BP 140/90 | HR 72 | Ht 68.0 in | Wt 305.2 lb

## 2020-02-22 DIAGNOSIS — M79652 Pain in left thigh: Secondary | ICD-10-CM | POA: Diagnosis not present

## 2020-02-22 DIAGNOSIS — R0683 Snoring: Secondary | ICD-10-CM | POA: Diagnosis not present

## 2020-02-22 DIAGNOSIS — I471 Supraventricular tachycardia: Secondary | ICD-10-CM | POA: Diagnosis not present

## 2020-02-22 MED ORDER — METOPROLOL TARTRATE 25 MG PO TABS: 25.0000 mg | ORAL_TABLET | Freq: Two times a day (BID) | ORAL | 3 refills | Status: DC

## 2020-02-22 NOTE — Progress Notes (Signed)
Cardiology Office Note:    Date:  02/22/2020   ID:  Ann Greene, DOB 1966-03-31, MRN [id number]  PCP:  Monica Becton, MD  Cardiologist:  No primary care provider on file.  Electrophysiologist:  None   Referring MD: Monica Becton,*   Chief Complaint: Palpitations  History of Present Illness:    Ann Greene is a 54 y.o. female with a history of anxiety and panic disorder, hypothyroidism with a history of Hashimoto's disease, positive ANA, osteoarthritis of the right knee, hyperlipidemia, adult ADHD previously on Vyvanse, excessive daytime sleepiness.  She presents today for further evaluation of palpitations after a cardiac monitor was performed.  Monitor results reveal symptomatic SVT.  She tells me that approximately 1 month ago at rest while participating in a podcast, she experienced sudden onset of tachycardia and when she looked at her apple watch her heart rate was 210 bpm.  She felt presyncopal and has dizziness.  She had a sensation across her back and chest.  At this point she called for her husband and ended her pod cast.  She put her feet up and took a baby aspirin.  This episode lasted approximately 30 minutes.  She denies a sense of significant chest pain but did have an unusual sensation in her chest.  Approximately 10 years ago she had an episode of vertigo which had a full work-up with ENT.  She feels this was truly representative of vertigo without episodes of presyncope.  She eventually determined that some of the symptoms were from panic attacks.  She has been on Cymbalta and has had no significant panic episodes since that time though she does struggle with some anxiety.  She denies significant shortness of breath or dyspnea on exertion.  She denies frank syncope.  She has tried to improve her health and she and her husband are pursuing a vegan diet.  She snores and feels increased daytime fatigue.  She will need a referral for sleep study.  She has left  lateral thigh pain, and is concerned she has a blood clot in her legs.  She is requesting lower extremity Doppler studies.  We discussed that these can be coordinated through primary care, however she would like to get those accomplished today.  Vertigo 10 years ago - still has it, not as severe, full evaluation benign, true vertigo, no presyncope.  Whole room spinning, eventually felt to be a panic attack.   Past Medical History:  Diagnosis Date  . Thyroid disease     Past Surgical History:  Procedure Laterality Date  . CHOLECYSTECTOMY  1996  . TONSILLECTOMY      Current Medications: Current Meds  Medication Sig  . DULoxetine (CYMBALTA) 60 MG capsule TAKE 1 CAPSULE (60 MG TOTAL) BY MOUTH DAILY. DUE FOR FOLLOW UP.  . levothyroxine (SYNTHROID) 150 MCG tablet Take 1 tablet (150 mcg total) by mouth daily before breakfast.     Allergies:   Patient has no known allergies.   Social History   Socioeconomic History  . Marital status: Married    Spouse name: Not on file  . Number of children: Not on file  . Years of education: Not on file  . Highest education level: Not on file  Occupational History  . Not on file  Tobacco Use  . Smoking status: Never Smoker  . Smokeless tobacco: Never Used  Substance and Sexual Activity  . Alcohol use: Yes    Alcohol/week: 0.0 standard drinks    Comment: occasional  .  Drug use: No  . Sexual activity: Yes  Other Topics Concern  . Not on file  Social History Narrative  . Not on file   Social Determinants of Health   Financial Resource Strain:   . Difficulty of Paying Living Expenses:   Food Insecurity:   . Worried About Charity fundraiser in the Last Year:   . Arboriculturist in the Last Year:   Transportation Needs:   . Film/video editor (Medical):   Marland Kitchen Lack of Transportation (Non-Medical):   Physical Activity:   . Days of Exercise per Week:   . Minutes of Exercise per Session:   Stress:   . Feeling of Stress :   Social  Connections:   . Frequency of Communication with Friends and Family:   . Frequency of Social Gatherings with Friends and Family:   . Attends Religious Services:   . Active Member of Clubs or Organizations:   . Attends Archivist Meetings:   Marland Kitchen Marital Status:      Family History: The patient's family history includes Alcohol abuse in her brother, father, and paternal uncle; Cancer in her brother and paternal uncle; Heart attack in her father and maternal grandmother; Suicidality in her brother.  ROS:   Please see the history of present illness.    All other systems reviewed and are negative.  EKGs/Labs/Other Studies Reviewed:    The following studies were reviewed today:  EKG:  NSR   Recent Labs: 01/29/2020: ALT 12; BUN 9; Creat 0.54; Hemoglobin 13.5; Platelets 437; Potassium 4.6; Sodium 138; TSH 0.21  Recent Lipid Panel    Component Value Date/Time   CHOL 210 (H) 03/16/2019 0842   TRIG 135 03/16/2019 0842   HDL 52 03/16/2019 0842   CHOLHDL 4.0 03/16/2019 0842   VLDL 17 12/19/2015 0810   LDLCALC 133 (H) 03/16/2019 0842    Physical Exam:    VS:  BP 140/90   Pulse 72   Ht 5\' 8"  (1.727 m)   Wt (!) 305 lb 3.2 oz (138.4 kg)   SpO2 98%   BMI 46.41 kg/m     Wt Readings from Last 5 Encounters:  02/22/20 (!) 305 lb 3.2 oz (138.4 kg)  01/29/20 299 lb (135.6 kg)  10/13/19 298 lb (135.2 kg)  03/14/19 (!) 326 lb (147.9 kg)  11/24/17 286 lb (129.7 kg)     Constitutional: No acute distress Eyes: sclera non-icteric, normal conjunctiva and lids ENMT: normal dentition, moist mucous membranes Cardiovascular: regular rhythm, normal rate, no murmurs. S1 and S2 normal. Radial pulses normal bilaterally. No jugular venous distention.  Respiratory: clear to auscultation bilaterally GI : normal bowel sounds, soft and nontender. No distention.   MSK: extremities warm, well perfused. No edema.  NEURO: grossly nonfocal exam, moves all extremities. PSYCH: alert and oriented x  3, normal mood and affect.   ASSESSMENT:    1. SVT (supraventricular tachycardia) ([place])   2. Snoring   3. Pain of left thigh    PLAN:    SVT (supraventricular tachycardia) ([facility]) -  She appears to have symptomatic SVT, and this is likely the source of her episode from 1 month ago. I have offered metoprolol for symptoms, she would like to try this and will let me know how her symptoms are. We will also complete an echo to evaluate for structural causes of SVT Plan: EKG 12-Lead, ECHOCARDIOGRAM COMPLETE  Snoring - She had daytime sleepiness and fatigue and snores, with SVT we need  to evaluate for OSA.  Plan: Split night study  Pain of left thigh - unclear what this represents though may be IT band tendonitis, patient is concerned this may represent DVT and requests LE dopplers. Plan: VAS [place] LOWER EXTREMITY VENOUS (DVT)  Total time of encounter: 60 minutes total time of encounter, including 40 minutes spent in face-to-face patient care on the date of this encounter. This time includes coordination of care and counseling regarding above mentioned problem list. Remainder of non-face-to-face time involved reviewing chart documents/testing relevant to the patient encounter and documentation in the medical record. I have independently reviewed documentation from referring provider.   Weston Brass, MD [facility]    Medication Adjustments/Labs and Tests Ordered: Current medicines are reviewed at length with the patient today.  Concerns regarding medicines are outlined above.  Orders Placed This Encounter  Procedures  . EKG 12-Lead  . ECHOCARDIOGRAM COMPLETE  . Split night study  . VAS [place] LOWER EXTREMITY VENOUS (DVT)   Meds ordered this encounter  Medications  . metoprolol tartrate (LOPRESSOR) 25 MG tablet    Sig: Take 1 tablet (25 mg total) by mouth 2 (two) times daily.    Dispense:  60 tablet    Refill:  3    Patient Instructions  Medication Instructions:  START  METOPROLOL TART 25MG  TWICE DAILY *If you need a refill on your cardiac medications before your next appointment, please call your pharmacy*  Testing/Procedures: Echocardiogram - Your physician has requested that you have an echocardiogram. Echocardiography is a painless test that uses sound waves to create images of your heart. It provides your doctor with information about the size and shape of your heart and how well your heart's chambers and valves are working. This procedure takes approximately one hour. There are no restrictions for this procedure. This will be performed at our [facility] location - [address].  Your physician has recommended that you have a sleep study. This test records several body functions during sleep, including: brain activity, eye movement, oxygen and carbon dioxide blood levels, heart rate and rhythm, breathing rate and rhythm, the flow of air through your mouth and nose, snoring, body muscle movements, and chest and belly movement.  Your physician has requested that you have a lower extremity venus duplex. This test is an ultrasound of the arteries in the legs. It looks at arterial blood flow in the legs. Allow one hour for Lower Arterial scans. There are no restrictions or special instructions  Follow-Up: Your next appointment:  3-4 week(s)  In Person with [address], MD  At [facility], you and your health needs are our priority.  As part of our continuing mission to provide you with exceptional heart care, we have created designated Provider Care Teams.  These Care Teams include your primary Cardiologist (physician) and Advanced Practice Providers (APPs -  Physician Assistants and Nurse Practitioners) who all work together to provide you with the care you need, when you need it.

## 2020-02-22 NOTE — Telephone Encounter (Signed)
-----   Message from Leotis Pain sent at 02/22/2020 10:13 AM EDT ----- Regarding: Sleep Study Dr. Jacques Navy

## 2020-02-22 NOTE — Patient Instructions (Addendum)
Medication Instructions:  START METOPROLOL TART 25MG  TWICE DAILY *If you need a refill on your cardiac medications before your next appointment, please call your pharmacy*  Testing/Procedures: Echocardiogram - Your physician has requested that you have an echocardiogram. Echocardiography is a painless test that uses sound waves to create images of your heart. It provides your doctor with information about the size and shape of your heart and how well your heart's chambers and valves are working. This procedure takes approximately one hour. There are no restrictions for this procedure. This will be performed at our [facility] location - [address].  Your physician has recommended that you have a sleep study. This test records several body functions during sleep, including: brain activity, eye movement, oxygen and carbon dioxide blood levels, heart rate and rhythm, breathing rate and rhythm, the flow of air through your mouth and nose, snoring, body muscle movements, and chest and belly movement.  Your physician has requested that you have a lower extremity venus duplex. This test is an ultrasound of the arteries in the legs. It looks at arterial blood flow in the legs. Allow one hour for Lower Arterial scans. There are no restrictions or special instructions  Follow-Up: Your next appointment:  3-4 week(s)  In Person with [address], MD  At [facility], you and your health needs are our priority.  As part of our continuing mission to provide you with exceptional heart care, we have created designated Provider Care Teams.  These Care Teams include your primary Cardiologist (physician) and Advanced Practice Providers (APPs -  Physician Assistants and Nurse Practitioners) who all work together to provide you with the care you need, when you need it.

## 2020-02-23 ENCOUNTER — Encounter: Payer: Self-pay | Admitting: Sports Medicine

## 2020-02-23 ENCOUNTER — Ambulatory Visit (HOSPITAL_COMMUNITY)
Admission: RE | Admit: 2020-02-23 | Discharge: 2020-02-23 | Disposition: A | Discharge status: Home / Self Care | Payer: BC Managed Care – PPO | Source: Ambulatory Visit | Attending: Cardiovascular Disease | Admitting: Cardiovascular Disease

## 2020-02-23 DIAGNOSIS — M79652 Pain in left thigh: Secondary | ICD-10-CM | POA: Diagnosis not present

## 2020-02-23 LAB — COLOGUARD
COLOGUARD: NEGATIVE
Cologuard: NEGATIVE

## 2020-03-04 ENCOUNTER — Telehealth: Payer: Self-pay | Admitting: *Deleted

## 2020-03-04 NOTE — Telephone Encounter (Signed)
We can do an uncomplicated UTI as a nurse visit.

## 2020-03-04 NOTE — Telephone Encounter (Signed)
Pt left vm stating that she has another uti and wants to know if you could call her in some abx. I know that we need a urine on her but do you want her to come into the office for this or to just drop a sample off at the lab? Please advise.

## 2020-03-04 NOTE — Telephone Encounter (Signed)
There are no appointments on nurse schedule for the rest of this week.   Patient can have acute appt with Ashley Royalty or another provider whop has an opening on 03/05/20  FYI to PCP and note to front office to call pt and schedule

## 2020-03-05 NOTE — Telephone Encounter (Signed)
Excellent, anybody can do an uncomplicated UTI.

## 2020-03-05 NOTE — Telephone Encounter (Signed)
Patient stated that medication was ordered for her recently and didn't understand why the same thing couldn't be done this time. States she will look at it online and call back if she needs [place]. No further questions at this time.

## 2020-03-08 ENCOUNTER — Encounter: Payer: Self-pay | Admitting: Sports Medicine

## 2020-03-11 ENCOUNTER — Other Ambulatory Visit: Payer: Self-pay

## 2020-03-11 ENCOUNTER — Ambulatory Visit (HOSPITAL_COMMUNITY): Payer: BC Managed Care – PPO | Attending: Cardiology

## 2020-03-11 DIAGNOSIS — I471 Supraventricular tachycardia: Secondary | ICD-10-CM | POA: Insufficient documentation

## 2020-03-20 ENCOUNTER — Ambulatory Visit (INDEPENDENT_AMBULATORY_CARE_PROVIDER_SITE_OTHER): Payer: BC Managed Care – PPO | Admitting: Internal Medicine

## 2020-03-20 ENCOUNTER — Other Ambulatory Visit: Payer: Self-pay

## 2020-03-20 ENCOUNTER — Encounter: Payer: Self-pay | Admitting: Internal Medicine

## 2020-03-20 VITALS — BP 104/70 | HR 73 | Ht 69.0 in | Wt 306.2 lb

## 2020-03-20 DIAGNOSIS — I471 Supraventricular tachycardia: Secondary | ICD-10-CM

## 2020-03-20 DIAGNOSIS — R0683 Snoring: Secondary | ICD-10-CM | POA: Diagnosis not present

## 2020-03-20 DIAGNOSIS — E782 Mixed hyperlipidemia: Secondary | ICD-10-CM | POA: Diagnosis not present

## 2020-03-20 NOTE — Patient Instructions (Signed)
Medication Instructions:  No changes    *If you need a refill on your cardiac medications before your next appointment, please call your pharmacy*   Lab Work: Not needed   Testing/Procedures: Not needed   Follow-Up: At [facility], you and your health needs are our priority.  As part of our continuing mission to provide you with exceptional heart care, we have created designated Provider Care Teams.  These Care Teams include your primary Cardiologist (physician) and Advanced Practice Providers (APPs -  Physician Assistants and Nurse Practitioners) who all work together to provide you with the care you need, when you need it.    Your next appointment:   6 month(s)- Nov 2021  The format for your next appointment:   In Person  Provider:   Weston Brass, MD   Other Instructions  Will have the office to contact you on the status of sleep study .   try to exercise at least 150 minutes a week Try to follow a Mediterranean diet         Why follow it? Research shows. . Those who follow the Mediterranean diet have a reduced risk of heart disease  . The diet is associated with a reduced incidence of Parkinson's and Alzheimer's diseases . People following the diet may have longer life expectancies and lower rates of chronic diseases  . The Dietary Guidelines for Americans recommends the Mediterranean diet as an eating plan to promote health and prevent disease  What Is the Mediterranean Diet?  . Healthy eating plan based on typical foods and recipes of Mediterranean-style cooking . The diet is primarily a plant based diet; these foods should make up a majority of meals   Starches - Plant based foods should make up a majority of meals - They are an important sources of vitamins, minerals, energy, antioxidants, and fiber - Choose whole grains, foods high in fiber and minimally processed items  - Typical grain sources include wheat, oats, barley, corn, brown rice, bulgar,  farro, millet, polenta, couscous  - Various types of beans include chickpeas, lentils, fava beans, black beans, white beans   Fruits  Veggies - Large quantities of antioxidant rich fruits & veggies; 6 or more servings  - Vegetables can be eaten raw or lightly drizzled with oil and cooked  - Vegetables common to the traditional Mediterranean Diet include: artichokes, arugula, beets, broccoli, brussel sprouts, cabbage, carrots, celery, collard greens, cucumbers, eggplant, kale, leeks, lemons, lettuce, mushrooms, okra, onions, peas, peppers, potatoes, pumpkin, radishes, rutabaga, shallots, spinach, sweet potatoes, turnips, zucchini - Fruits common to the Mediterranean Diet include: apples, apricots, avocados, cherries, clementines, dates, figs, grapefruits, grapes, melons, nectarines, oranges, peaches, pears, pomegranates, strawberries, tangerines  Fats - Replace butter and margarine with healthy oils, such as olive oil, canola oil, and tahini  - Limit nuts to no more than a handful a day  - Nuts include walnuts, almonds, pecans, pistachios, pine nuts  - Limit or avoid candied, honey roasted or heavily salted nuts - Olives are central to the [employer] - can be eaten whole or used in a variety of dishes   Meats Protein - Limiting red meat: no more than a few times a month - When eating red meat: choose lean cuts and keep the portion to the size of deck of cards - Eggs: approx. 0 to 4 times a week  - Fish and lean poultry: at least 2 a week  - Healthy protein sources include, chicken, [place], lean beef, lamb -  Increase intake of seafood such as tuna, salmon, trout, mackerel, shrimp, scallops - Avoid or limit high fat processed meats such as sausage and bacon  Dairy - Include moderate amounts of low fat dairy products  - Focus on healthy dairy such as fat free yogurt, skim milk, low or reduced fat cheese - Limit dairy products higher in fat such as whole or 2% milk, cheese, ice cream  Alcohol  - Moderate amounts of red wine is ok  - No more than 5 oz daily for women (all ages) and men older than age 36  - No more than 10 oz of wine daily for men younger than 3  Other - Limit sweets and other desserts  - Use herbs and spices instead of salt to flavor foods  - Herbs and spices common to the traditional Mediterranean Diet include: basil, bay leaves, chives, cloves, cumin, fennel, garlic, lavender, marjoram, mint, oregano, parsley, pepper, rosemary, sage, savory, sumac, tarragon, thyme   It's not just a diet, it's a lifestyle:  . The Mediterranean diet includes lifestyle factors typical of those in the region  . Foods, drinks and meals are best eaten with others and savored . Daily physical activity is important for overall good health . This could be strenuous exercise like running and aerobics . This could also be more leisurely activities such as walking, housework, yard-work, or taking the stairs . Moderation is the key; a balanced and healthy diet accommodates most foods and drinks . Consider portion sizes and frequency of consumption of certain foods   Meal Ideas & Options:  . Breakfast:  o Whole wheat toast or whole wheat English muffins with peanut butter & hard boiled egg o Steel cut oats topped with apples & cinnamon and skim milk  o Fresh fruit: banana, strawberries, melon, berries, peaches  o Smoothies: strawberries, bananas, greek yogurt, peanut butter o Low fat greek yogurt with blueberries and granola  o Egg white omelet with spinach and mushrooms o Breakfast couscous: whole wheat couscous, apricots, skim milk, cranberries  . Sandwiches:  o Hummus and grilled vegetables (peppers, zucchini, squash) on whole wheat bread   o Grilled chicken on whole wheat pita with lettuce, tomatoes, cucumbers or tzatziki  o Tuna salad on whole wheat bread: tuna salad made with greek yogurt, olives, red peppers, capers, green onions o Garlic rosemary lamb pita: lamb sauted with  garlic, rosemary, salt & pepper; add lettuce, cucumber, greek yogurt to pita - flavor with lemon juice and black pepper  . Seafood:  o Mediterranean grilled salmon, seasoned with garlic, basil, parsley, lemon juice and black pepper o Shrimp, lemon, and spinach whole-grain pasta salad made with low fat greek yogurt  o Seared scallops with lemon orzo  o Seared tuna steaks seasoned salt, pepper, coriander topped with tomato mixture of olives, tomatoes, olive oil, minced garlic, parsley, green onions and cappers  . Meats:  o Herbed greek chicken salad with kalamata olives, cucumber, feta  o Red bell peppers stuffed with spinach, bulgur, lean ground beef (or lentils) & topped with feta   o Kebabs: skewers of chicken, tomatoes, onions, zucchini, squash  o [place] burgers: made with red onions, mint, dill, lemon juice, feta cheese topped with roasted red peppers . Vegetarian o Cucumber salad: cucumbers, artichoke hearts, celery, red onion, feta cheese, tossed in olive oil & lemon juice  o Hummus and whole grain pita points with a greek salad (lettuce, tomato, feta, olives, cucumbers, red onion) o Lentil soup with celery,  carrots made with vegetable broth, garlic, salt and pepper  o Tabouli salad: parsley, bulgur, mint, scallions, cucumbers, tomato, radishes, lemon juice, olive oil, salt and pepper.         Preventing Unhealthy Weight Gain, Adult Staying at a healthy weight is important to your overall health. When fat builds up in your body, you may become overweight or obese. Being overweight or obese increases your risk of developing certain health problems, such as heart disease, diabetes, sleeping problems, joint problems, and some types of cancer. Unhealthy weight gain is often the result of making unhealthy food choices or not getting enough exercise. You can make changes to your lifestyle to prevent obesity and stay as healthy as possible. What nutrition changes can be made?   Eat only as  much as your body needs. To do this: ? Pay attention to signs that you are hungry or full. Stop eating as soon as you feel full. ? If you feel hungry, try drinking water first before eating. Drink enough water so your urine is clear or pale yellow. ? Eat smaller portions. Pay attention to portion sizes when eating out. ? Look at serving sizes on food labels. Most foods contain more than one serving per container. ? Eat the recommended number of calories for your gender and activity level. For most active people, a daily total of 2,000 calories is appropriate. If you are trying to lose weight or are not very active, you may need to eat fewer calories. Talk with your health care provider or a diet and nutrition specialist (dietitian) about how many calories you need each day.  Choose healthy foods, such as: ? Fruits and vegetables. At each meal, try to fill at least half of your plate with fruits and vegetables. ? Whole grains, such as whole-wheat bread, brown rice, and quinoa. ? Lean meats, such as chicken or fish. ? Other healthy proteins, such as beans, eggs, or tofu. ? Healthy fats, such as nuts, seeds, fatty fish, and olive oil. ? Low-fat or fat-free dairy products.  Check food labels, and avoid food and drinks that: ? Are high in calories. ? Have added sugar. ? Are high in sodium. ? Have saturated fats or trans fats.  Cook foods in healthier ways, such as by baking, broiling, or grilling.  Make a meal plan for the week, and shop with a grocery list to help you stay on track with your purchases. Try to avoid going to the grocery store when you are hungry.  When grocery shopping, try to shop around the outside of the store first, where the fresh foods are. Doing this helps you to avoid prepackaged foods, which can be high in sugar, salt (sodium), and fat. What lifestyle changes can be made?   Exercise for 30 or more minutes on 5 or more days each week. Exercising may include brisk  walking, yard work, biking, running, swimming, and team sports like basketball and soccer. Ask your health care provider which exercises are safe for you.  Do muscle-strengthening activities, such as lifting weights or using resistance bands, on 2 or more days a week.  Do not use any products that contain nicotine or tobacco, such as cigarettes and e-cigarettes. If you need help quitting, ask your health care provider.  Limit alcohol intake to no more than 1 drink a day for nonpregnant women and 2 drinks a day for men. One drink equals 12 oz of beer, 5 oz of wine, or 1 oz of hard  liquor.  Try to get 7-9 hours of sleep each night. What other changes can be made?  Keep a food and activity journal to keep track of: ? What you ate and how many calories you had. Remember to count the calories in sauces, dressings, and side dishes. ? Whether you were active, and what exercises you did. ? Your calorie, weight, and activity goals.  Check your weight regularly. Track any changes. If you notice you have gained weight, make changes to your diet or activity routine.  Avoid taking weight-loss medicines or supplements. Talk to your health care provider before starting any new medicine or supplement.  Talk to your health care provider before trying any new diet or exercise plan. Why are these changes important? Eating healthy, staying active, and having healthy habits can help you to prevent obesity. Those changes also:  Help you manage stress and emotions.  Help you connect with friends and family.  Improve your self-esteem.  Improve your sleep.  Prevent long-term health problems. What can happen if changes are not made? Being obese or overweight can cause you to develop joint or bone problems, which can make it hard for you to stay active or do activities you enjoy. Being obese or overweight also puts stress on your heart and lungs and can lead to health problems like diabetes, heart disease,  and some cancers. Where to find more information Talk with your health care provider or a dietitian about healthy eating and healthy lifestyle choices. You may also find information from:  [place] Department of Agriculture, MyPlate: https://ball-collins.biz/  American Heart Association: www.heart.org  Centers for Disease Control and Prevention: FootballExhibition.com.br Summary  Staying at a healthy weight is important to your overall health. It helps you to prevent certain diseases and health problems, such as heart disease, diabetes, joint problems, sleep disorders, and some types of cancer.  Being obese or overweight can cause you to develop joint or bone problems, which can make it hard for you to stay active or do activities you enjoy.  You can prevent unhealthy weight gain by eating a healthy diet, exercising regularly, not smoking, limiting alcohol, and getting enough sleep.  Talk with your health care provider or a dietitian for guidance about healthy eating and healthy lifestyle choices. This information is not intended to replace advice given to you by your health care provider. Make sure you discuss any questions you have with your health care provider. Document Revised: 10/22/2017 Document Reviewed: 11/25/2016 Elsevier Patient Education  2020 [employer].

## 2020-03-20 NOTE — Progress Notes (Signed)
Cardiology Office Note:    Date:  03/20/2020   ID:  Ann Greene, DOB 05-25-1966, MRN [id number]  PCP:  Monica Becton, MD  Cardiologist:  No primary care provider on file.  Electrophysiologist:  None   Referring MD: Monica Becton,*   Chief Complaint: Follow-up palpitations and daytime fatigue  History of Present Illness:    Ann Greene is a 54 y.o. female with a history of anxiety and panic disorder, hypothyroidism with a history of Hashimoto's disease, positive ANA, osteoarthritis of the right knee, hyperlipidemia, adult ADHD previously on Vyvanse, and excessive daytime sleepiness who presents for follow-up of palpitations noted to be symptomatic SVT on cardiac monitor.  She is tolerating metoprolol 25 mg twice daily well, but continues to endorse daytime fatigue.  She feels this precedes the initiation of metoprolol, and would like to defer up titration until sleep study is completed.  She continues to have some palpitations but they have improved on metoprolol.  We have referred her for a split-night study for assessment of sleep apnea, we will continue to coordinate this.  She notes excessive daytime fatigue, snoring, and we are both suspicious this is the cause of her sleepiness and fatigue during the day.  She denies chest pain, syncope, PND, orthopnea.  We reviewed results of echocardiogram, lower extremity Dopplers, pertinent laboratory studies in detail.  We also discussed cardiovascular preventive measures including Mediterranean diet and AHA recommended exercise and strategies for completion.  Past Medical History:  Diagnosis Date  . Thyroid disease     Past Surgical History:  Procedure Laterality Date  . CHOLECYSTECTOMY  1996  . TONSILLECTOMY      Current Medications: Current Meds  Medication Sig  . DULoxetine (CYMBALTA) 60 MG capsule TAKE 1 CAPSULE (60 MG TOTAL) BY MOUTH DAILY. DUE FOR FOLLOW UP.  . levothyroxine (SYNTHROID) 150 MCG tablet Take 1  tablet (150 mcg total) by mouth daily before breakfast.  . metoprolol tartrate (LOPRESSOR) 25 MG tablet Take 1 tablet (25 mg total) by mouth 2 (two) times daily.     Allergies:   Patient has no known allergies.   Social History   Socioeconomic History  . Marital status: Married    Spouse name: Not on file  . Number of children: Not on file  . Years of education: Not on file  . Highest education level: Not on file  Occupational History  . Not on file  Tobacco Use  . Smoking status: Never Smoker  . Smokeless tobacco: Never Used  Substance and Sexual Activity  . Alcohol use: Yes    Alcohol/week: 0.0 standard drinks    Comment: occasional  . Drug use: No  . Sexual activity: Yes  Other Topics Concern  . Not on file  Social History Narrative  . Not on file   Social Determinants of Health   Financial Resource Strain:   . Difficulty of Paying Living Expenses:   Food Insecurity:   . Worried About Programme researcher, broadcasting/film/video in the Last Year:   . Barista in the Last Year:   Transportation Needs:   . Freight forwarder (Medical):   Marland Kitchen Lack of Transportation (Non-Medical):   Physical Activity:   . Days of Exercise per Week:   . Minutes of Exercise per Session:   Stress:   . Feeling of Stress :   Social Connections:   . Frequency of Communication with Friends and Family:   . Frequency of Social Gatherings with Friends and  Family:   . Attends Religious Services:   . Active Member of Clubs or Organizations:   . Attends Banker Meetings:   Marland Kitchen Marital Status:      Family History: The patient's family history includes Alcohol abuse in her brother, father, and paternal uncle; Cancer in her brother and paternal uncle; Heart attack in her father and maternal grandmother; Suicidality in her brother.  ROS:   Please see the history of present illness.    All other systems reviewed and are negative.  EKGs/Labs/Other Studies Reviewed:    The following studies were  reviewed today:  EKG: N/A  Recent Labs: 01/29/2020: ALT 12; BUN 9; Creat 0.54; Hemoglobin 13.5; Platelets 437; Potassium 4.6; Sodium 138; TSH 0.21  Recent Lipid Panel    Component Value Date/Time   CHOL 210 (H) 03/16/2019 0842   TRIG 135 03/16/2019 0842   HDL 52 03/16/2019 0842   CHOLHDL 4.0 03/16/2019 0842   VLDL 17 12/19/2015 0810   LDLCALC 133 (H) 03/16/2019 0842    Physical Exam:    VS:  BP 104/70   Pulse 73   Ht  (1.753 m)   Wt (!) 306 lb 3.2 oz (138.9 kg)   SpO2 93%   BMI 45.22 kg/m     Wt Readings from Last 5 Encounters:  03/20/20 (!) 306 lb 3.2 oz (138.9 kg)  02/22/20 (!) 305 lb 3.2 oz (138.4 kg)  01/29/20 299 lb (135.6 kg)  10/13/19 298 lb (135.2 kg)  03/14/19 (!) 326 lb (147.9 kg)     Constitutional: No acute distress Eyes: sclera non-icteric, normal conjunctiva and lids ENMT: normal dentition, moist mucous membranes Cardiovascular: regular rhythm, normal rate, no murmurs. S1 and S2 normal. Radial pulses normal bilaterally. No jugular venous distention.  Respiratory: clear to auscultation bilaterally GI : normal bowel sounds, soft and nontender. No distention.   MSK: extremities warm, well perfused. No edema.  NEURO: grossly nonfocal exam, moves all extremities. PSYCH: alert and oriented x 3, normal mood and affect.   ASSESSMENT:    1. SVT (supraventricular tachycardia) (HCC)   2. Snoring   3. Hyperlipidemia, mixed    PLAN:    SVT (supraventricular tachycardia) (HCC) Continue metoprolol tartrate 25 mg twice daily.  Will defer up titration at this time given excessive daytime fatigue so as to not confound this symptom.  Snoring Continue referral for split-night study, can complete home sleep test if needed.  This is patient's main concern today.  Hyperlipidemia, mixed Obesity LDL is elevated at 133. We discussed diet lifestyle measures today.  Mediterranean diet was discussed, patient is a vegan.  We discussed AHA recommendation of 150  minutes of moderate intensity exercise in a week, patient plans to take to 15-minute walks daily which will be an optimal place to start her exercise regimen.  She is to contact our office with any concerning cardiopulmonary symptoms.  Moderate LVH on echo-continue to monitor, blood pressure is low normal today.  No symptoms of diastolic dysfunction.  Continue to observe.  Total time of encounter: 30 minutes total time of encounter, including 25 minutes spent in face-to-face patient care on the date of this encounter. This time includes coordination of care and counseling regarding above mentioned problem list. Remainder of non-face-to-face time involved reviewing chart documents/testing relevant to the patient encounter and documentation in the medical record. I have independently reviewed documentation from referring provider.   Weston Brass, MD [place]  [facility]    Medication Adjustments/Labs and  Tests Ordered: Current medicines are reviewed at length with the patient today.  Concerns regarding medicines are outlined above.  No orders of the defined types were placed in this encounter.  No orders of the defined types were placed in this encounter.   Patient Instructions   Medication Instructions:  No changes    *If you need a refill on your cardiac medications before your next appointment, please call your pharmacy*   Lab Work: Not needed   Testing/Procedures: Not needed   Follow-Up: At [facility], you and your health needs are our priority.  As part of our continuing mission to provide you with exceptional heart care, we have created designated Provider Care Teams.  These Care Teams include your primary Cardiologist (physician) and Advanced Practice Providers (APPs -  Physician Assistants and Nurse Practitioners) who all work together to provide you with the care you need, when you need it.    Your next appointment:   6 month(s)- Nov 2021  The format  for your next appointment:   In Person  Provider:   Weston BrassGayatri Vilma Will, MD   Other Instructions  Will have the office to contact you on the status of sleep study .   try to exercise at least 150 minutes a week Try to follow a Mediterranean diet         Why follow it? Research shows. . Those who follow the Mediterranean diet have a reduced risk of heart disease  . The diet is associated with a reduced incidence of Parkinson's and Alzheimer's diseases . People following the diet may have longer life expectancies and lower rates of chronic diseases  . The Dietary Guidelines for Americans recommends the Mediterranean diet as an eating plan to promote health and prevent disease  What Is the Mediterranean Diet?  . Healthy eating plan based on typical foods and recipes of Mediterranean-style cooking . The diet is primarily a plant based diet; these foods should make up a majority of meals   Starches - Plant based foods should make up a majority of meals - They are an important sources of vitamins, minerals, energy, antioxidants, and fiber - Choose whole grains, foods high in fiber and minimally processed items  - Typical grain sources include wheat, oats, barley, corn, brown rice, bulgar, farro, millet, polenta, couscous  - Various types of beans include chickpeas, lentils, fava beans, black beans, white beans   Fruits  Veggies - Large quantities of antioxidant rich fruits & veggies; 6 or more servings  - Vegetables can be eaten raw or lightly drizzled with oil and cooked  - Vegetables common to the traditional Mediterranean Diet include: artichokes, arugula, beets, broccoli, brussel sprouts, cabbage, carrots, celery, collard greens, cucumbers, eggplant, kale, leeks, lemons, lettuce, mushrooms, okra, onions, peas, peppers, potatoes, pumpkin, radishes, rutabaga, shallots, spinach, sweet potatoes, turnips, zucchini - Fruits common to the Mediterranean Diet include: apples, apricots, avocados,  cherries, clementines, dates, figs, grapefruits, grapes, melons, nectarines, oranges, peaches, pears, pomegranates, strawberries, tangerines  Fats - Replace butter and margarine with healthy oils, such as olive oil, canola oil, and tahini  - Limit nuts to no more than a handful a day  - Nuts include walnuts, almonds, pecans, pistachios, pine nuts  - Limit or avoid candied, honey roasted or heavily salted nuts - Olives are central to the Mediterranean diet - can be eaten whole or used in a variety of dishes   Meats Protein - Limiting red meat: no more than a few times a month -  When eating red meat: choose lean cuts and keep the portion to the size of deck of cards - Eggs: approx. 0 to 4 times a week  - Fish and lean poultry: at least 2 a week  - Healthy protein sources include, chicken, [place], lean beef, lamb - Increase intake of seafood such as tuna, salmon, trout, mackerel, shrimp, scallops - Avoid or limit high fat processed meats such as sausage and bacon  Dairy - Include moderate amounts of low fat dairy products  - Focus on healthy dairy such as fat free yogurt, skim milk, low or reduced fat cheese - Limit dairy products higher in fat such as whole or 2% milk, cheese, ice cream  Alcohol - Moderate amounts of red wine is ok  - No more than 5 oz daily for women (all ages) and men older than age 46  - No more than 10 oz of wine daily for men younger than 46  Other - Limit sweets and other desserts  - Use herbs and spices instead of salt to flavor foods  - Herbs and spices common to the traditional Mediterranean Diet include: basil, bay leaves, chives, cloves, cumin, fennel, garlic, lavender, marjoram, mint, oregano, parsley, pepper, rosemary, sage, savory, sumac, tarragon, thyme   It's not just a diet, it's a lifestyle:  . The Mediterranean diet includes lifestyle factors typical of those in the region  . Foods, drinks and meals are best eaten with others and savored . Daily physical  activity is important for overall good health . This could be strenuous exercise like running and aerobics . This could also be more leisurely activities such as walking, housework, yard-work, or taking the stairs . Moderation is the key; a balanced and healthy diet accommodates most foods and drinks . Consider portion sizes and frequency of consumption of certain foods   Meal Ideas & Options:  . Breakfast:  o Whole wheat toast or whole wheat English muffins with peanut butter & hard boiled egg o Steel cut oats topped with apples & cinnamon and skim milk  o Fresh fruit: banana, strawberries, melon, berries, peaches  o Smoothies: strawberries, bananas, greek yogurt, peanut butter o Low fat greek yogurt with blueberries and granola  o Egg white omelet with spinach and mushrooms o Breakfast couscous: whole wheat couscous, apricots, skim milk, cranberries  . Sandwiches:  o Hummus and grilled vegetables (peppers, zucchini, squash) on whole wheat bread   o Grilled chicken on whole wheat pita with lettuce, tomatoes, cucumbers or tzatziki  o Tuna salad on whole wheat bread: tuna salad made with greek yogurt, olives, red peppers, capers, green onions o Garlic rosemary lamb pita: lamb sauted with garlic, rosemary, salt & pepper; add lettuce, cucumber, greek yogurt to pita - flavor with lemon juice and black pepper  . Seafood:  o Mediterranean grilled salmon, seasoned with garlic, basil, parsley, lemon juice and black pepper o Shrimp, lemon, and spinach whole-grain pasta salad made with low fat greek yogurt  o Seared scallops with lemon orzo  o Seared tuna steaks seasoned salt, pepper, coriander topped with tomato mixture of olives, tomatoes, olive oil, minced garlic, parsley, green onions and cappers  . Meats:  o Herbed greek chicken salad with kalamata olives, cucumber, feta  o Red bell peppers stuffed with spinach, bulgur, lean ground beef (or lentils) & topped with feta   o Kebabs: skewers of  chicken, tomatoes, onions, zucchini, squash  o [place] burgers: made with red onions, mint, dill, lemon juice, feta  cheese topped with roasted red peppers . Vegetarian o Cucumber salad: cucumbers, artichoke hearts, celery, red onion, feta cheese, tossed in olive oil & lemon juice  o Hummus and whole grain pita points with a greek salad (lettuce, tomato, feta, olives, cucumbers, red onion) o Lentil soup with celery, carrots made with vegetable broth, garlic, salt and pepper  o Tabouli salad: parsley, bulgur, mint, scallions, cucumbers, tomato, radishes, lemon juice, olive oil, salt and pepper.         Preventing Unhealthy Weight Gain, Adult Staying at a healthy weight is important to your overall health. When fat builds up in your body, you may become overweight or obese. Being overweight or obese increases your risk of developing certain health problems, such as heart disease, diabetes, sleeping problems, joint problems, and some types of cancer. Unhealthy weight gain is often the result of making unhealthy food choices or not getting enough exercise. You can make changes to your lifestyle to prevent obesity and stay as healthy as possible. What nutrition changes can be made?   Eat only as much as your body needs. To do this: ? Pay attention to signs that you are hungry or full. Stop eating as soon as you feel full. ? If you feel hungry, try drinking water first before eating. Drink enough water so your urine is clear or pale yellow. ? Eat smaller portions. Pay attention to portion sizes when eating out. ? Look at serving sizes on food labels. Most foods contain more than one serving per container. ? Eat the recommended number of calories for your gender and activity level. For most active people, a daily total of 2,000 calories is appropriate. If you are trying to lose weight or are not very active, you may need to eat fewer calories. Talk with your health care provider or a diet and nutrition  specialist (dietitian) about how many calories you need each day.  Choose healthy foods, such as: ? Fruits and vegetables. At each meal, try to fill at least half of your plate with fruits and vegetables. ? Whole grains, such as whole-wheat bread, brown rice, and quinoa. ? Lean meats, such as chicken or fish. ? Other healthy proteins, such as beans, eggs, or tofu. ? Healthy fats, such as nuts, seeds, fatty fish, and olive oil. ? Low-fat or fat-free dairy products.  Check food labels, and avoid food and drinks that: ? Are high in calories. ? Have added sugar. ? Are high in sodium. ? Have saturated fats or trans fats.  Cook foods in healthier ways, such as by baking, broiling, or grilling.  Make a meal plan for the week, and shop with a grocery list to help you stay on track with your purchases. Try to avoid going to the grocery store when you are hungry.  When grocery shopping, try to shop around the outside of the store first, where the fresh foods are. Doing this helps you to avoid prepackaged foods, which can be high in sugar, salt (sodium), and fat. What lifestyle changes can be made?   Exercise for 30 or more minutes on 5 or more days each week. Exercising may include brisk walking, yard work, biking, running, swimming, and team sports like basketball and soccer. Ask your health care provider which exercises are safe for you.  Do muscle-strengthening activities, such as lifting weights or using resistance bands, on 2 or more days a week.  Do not use any products that contain nicotine or tobacco, such as cigarettes and  e-cigarettes. If you need help quitting, ask your health care provider.  Limit alcohol intake to no more than 1 drink a day for nonpregnant women and 2 drinks a day for men. One drink equals 12 oz of beer, 5 oz of wine, or 1 oz of hard liquor.  Try to get 7-9 hours of sleep each night. What other changes can be made?  Keep a food and activity journal to keep  track of: ? What you ate and how many calories you had. Remember to count the calories in sauces, dressings, and side dishes. ? Whether you were active, and what exercises you did. ? Your calorie, weight, and activity goals.  Check your weight regularly. Track any changes. If you notice you have gained weight, make changes to your diet or activity routine.  Avoid taking weight-loss medicines or supplements. Talk to your health care provider before starting any new medicine or supplement.  Talk to your health care provider before trying any new diet or exercise plan. Why are these changes important? Eating healthy, staying active, and having healthy habits can help you to prevent obesity. Those changes also:  Help you manage stress and emotions.  Help you connect with friends and family.  Improve your self-esteem.  Improve your sleep.  Prevent long-term health problems. What can happen if changes are not made? Being obese or overweight can cause you to develop joint or bone problems, which can make it hard for you to stay active or do activities you enjoy. Being obese or overweight also puts stress on your heart and lungs and can lead to health problems like diabetes, heart disease, and some cancers. Where to find more information Talk with your health care provider or a dietitian about healthy eating and healthy lifestyle choices. You may also find information from:  U.S. Department of Agriculture, MyPlate: https://ball-collins.biz/  American Heart Association: www.heart.org  Centers for Disease Control and Prevention: FootballExhibition.com.br Summary  Staying at a healthy weight is important to your overall health. It helps you to prevent certain diseases and health problems, such as heart disease, diabetes, joint problems, sleep disorders, and some types of cancer.  Being obese or overweight can cause you to develop joint or bone problems, which can make it hard for you to stay active or do  activities you enjoy.  You can prevent unhealthy weight gain by eating a healthy diet, exercising regularly, not smoking, limiting alcohol, and getting enough sleep.  Talk with your health care provider or a dietitian for guidance about healthy eating and healthy lifestyle choices. This information is not intended to replace advice given to you by your health care provider. Make sure you discuss any questions you have with your health care provider. Document Revised: 10/22/2017 Document Reviewed: 11/25/2016 Elsevier Patient Education  2020 [employer].

## 2020-04-05 ENCOUNTER — Telehealth: Payer: Self-pay | Admitting: *Deleted

## 2020-04-05 NOTE — Telephone Encounter (Signed)
Spoke with Ann Greene @ [employer] to check the status of sleep study Auth. CPT code 28118 closed on 03/26/20. The study was denied.peer to peer was requested.     (The sleep study will be resubmitted to [facility]. The incorrect CPT code was previously submitted.)

## 2020-04-06 ENCOUNTER — Other Ambulatory Visit: Payer: Self-pay | Admitting: Sports Medicine

## 2020-04-06 DIAGNOSIS — F411 Generalized anxiety disorder: Secondary | ICD-10-CM

## 2020-04-12 NOTE — Telephone Encounter (Signed)
Gu[place], CMA; P Cv Div Sleep Studies Cc: Theressa Stamps, RN; Deliah Boston, RN 03/22/20: Yetta Numbers Pending MD Review...CAS          ----- Message -----  From: Reesa Chew, CMA  Sent: 02/22/2020  3:44 PM EDT  To: [facility] Sleep Studies  Subject: FW: Sleep Study                  Split night  ----- Message -----  From: Leotis Pain  Sent: 02/22/2020 10:13 AM EDT  To: Theressa Stamps, RN, Reesa Chew, CMA, *  Subject: Sleep Study                    Dr. Jacques Navy

## 2020-04-22 ENCOUNTER — Telehealth: Payer: Self-pay | Admitting: Internal Medicine

## 2020-04-22 NOTE — Telephone Encounter (Signed)
Based off the notes the first pre-cert was denied for the sleep study due to the wrong CPT code. Ellyn Hack with [employer] said this was on May 21st. Notes say it was going to be resubmitted with the correct code, but she says they don't have another one showing.

## 2020-05-03 ENCOUNTER — Other Ambulatory Visit: Payer: Self-pay | Admitting: Sports Medicine

## 2020-05-03 DIAGNOSIS — E034 Atrophy of thyroid (acquired): Secondary | ICD-10-CM

## 2020-05-07 ENCOUNTER — Other Ambulatory Visit: Payer: Self-pay | Admitting: Internal Medicine

## 2020-05-07 ENCOUNTER — Telehealth: Payer: Self-pay | Admitting: *Deleted

## 2020-05-07 DIAGNOSIS — I471 Supraventricular tachycardia: Secondary | ICD-10-CM

## 2020-05-07 DIAGNOSIS — R400 Somnolence: Secondary | ICD-10-CM

## 2020-05-07 NOTE — Telephone Encounter (Signed)
MyChart message sent to patient about sleep study ordered by Dr Jacques Navy.

## 2020-05-15 ENCOUNTER — Other Ambulatory Visit: Payer: Self-pay | Admitting: Internal Medicine

## 2020-07-18 ENCOUNTER — Other Ambulatory Visit: Payer: Self-pay

## 2020-07-18 ENCOUNTER — Ambulatory Visit (HOSPITAL_BASED_OUTPATIENT_CLINIC_OR_DEPARTMENT_OTHER): Payer: BC Managed Care – PPO | Attending: Internal Medicine | Admitting: Cardiovascular Disease

## 2020-07-18 DIAGNOSIS — Z7901 Long term (current) use of anticoagulants: Secondary | ICD-10-CM | POA: Diagnosis not present

## 2020-07-18 DIAGNOSIS — R0902 Hypoxemia: Secondary | ICD-10-CM | POA: Insufficient documentation

## 2020-07-18 DIAGNOSIS — G4733 Obstructive sleep apnea (adult) (pediatric): Secondary | ICD-10-CM

## 2020-07-18 DIAGNOSIS — R400 Somnolence: Secondary | ICD-10-CM

## 2020-07-18 DIAGNOSIS — I471 Supraventricular tachycardia: Secondary | ICD-10-CM | POA: Diagnosis not present

## 2020-07-18 DIAGNOSIS — Z79899 Other long term (current) drug therapy: Secondary | ICD-10-CM | POA: Diagnosis not present

## 2020-07-30 ENCOUNTER — Telehealth: Payer: Self-pay | Admitting: *Deleted

## 2020-07-30 NOTE — Telephone Encounter (Signed)
A message was left, re: her follow up visit. 

## 2020-08-17 NOTE — Procedures (Signed)
    Patient Name: Ann Greene, Ann Greene Date: 07/18/2020 Gender: Female D.O.B: 1966-07-27 Age (years): 11 Referring Provider: Weston Brass MD Height (inches): 69 Interpreting Physician: Nicki Guadalajara MD, ABSM Weight (lbs): 306 RPSGT: Elaina Pattee BMI: 45 MRN: [id number] Neck Size: 15.00  CLINICAL INFORMATION Sleep Study Type: HST  Indication for sleep study: faitue, daytime sleepiness, snoring, SVT  Epworth Sleepiness Score: 9  SLEEP STUDY TECHNIQUE A multi-channel overnight portable sleep study was performed. The channels recorded were: nasal airflow, thoracic respiratory movement, and oxygen saturation with a pulse oximetry. Snoring was also monitored.  MEDICATIONS DULoxetine (CYMBALTA) 60 MG capsule levothyroxine (SYNTHROID) 150 MCG tablet metoprolol tartrate (LOPRESSOR) 25 MG tablet(Expired)  Patient self administered medications include: N/A.  SLEEP ARCHITECTURE Patient was studied for 382.5 minutes. The sleep efficiency was 100.0 % and the patient was supine for 0%. The arousal index was 0.0 per hour.  RESPIRATORY PARAMETERS The overall AHI was 30.1 per hour, with a central apnea index of 0.0 per hour.  The oxygen nadir was 82% during sleep.  CARDIAC DATA Mean heart rate during sleep was 79.5 bpm.  IMPRESSIONS - Severe obstructive sleep apnea occurred during this study (AHI = 30.1/h). - No significant central sleep apnea occurred during this study (CAI = 0.0/h). - Moderate oxygen desaturation to a nadir of 82%. - Patient snored 73.5% during the sleep.  DIAGNOSIS  - Obstructive Sleep Apnea (G47.33) - Nocturnal Hypoxemia (G47.36) - Snoring  RECOMMENDATIONS - In this patient with severe OSA and cardiovascular co-mobidities, recommend an in-lab CPAP titration study. If unable to schedule then intiate Auto-PAP with EPR at 6 - 18 cm of water. - Effort should be made to optimize nasal and oropharyngeal patency. - Avoid alcohol, sedatives and other CNS  depressants that may worsen sleep apnea and disrupt normal sleep architecture. - Sleep hygiene should be reviewed to assess factors that may improve sleep quality. - Weight management and regular exercise should be initiated or continued. - Recommend a download and sleep clinic evaluation after 4 weeks of therapy.   [Electronically signed] 08/17/2020 04:35 PM  Nicki Guadalajara MD, [facility], ABSM Diplomate, American Board of Sleep Medicine   NPI: [id number] [facility] PH: [phone]   FX: [phone] ACCREDITED BY THE AMERICAN ACADEMY OF SLEEP MEDICINE

## 2020-08-19 ENCOUNTER — Encounter: Payer: Self-pay | Admitting: Sports Medicine

## 2020-08-21 ENCOUNTER — Telehealth: Payer: Self-pay | Admitting: *Deleted

## 2020-08-21 DIAGNOSIS — R400 Somnolence: Secondary | ICD-10-CM

## 2020-08-21 NOTE — Telephone Encounter (Signed)
Informed patient of sleep study results and patient understanding was verbalized. Patient understands her sleep study showed: - Severe obstructive sleep apnea occurred during this study (AHI = 30.1/h).. - Moderate oxygen desaturation to a nadir of 82%. - Patient snored 73.5% during the sleep.  DIAGNOSIS  - Obstructive Sleep Apnea (G47.33) - Nocturnal Hypoxemia (G47.36) - Snoring  RECOMMENDATIONS  in-lab CPAP titration study  informed patient to call back with questions  Titration sent to sleep lab

## 2020-08-21 NOTE — Telephone Encounter (Signed)
-----   Message from Lennette Bihari, MD sent at 08/17/2020  4:39 PM EDT ----- Coralee [place], please notify pt and try to set up for CPAP titration; if unable to schedule then Auto-PAP

## 2020-08-27 NOTE — Addendum Note (Signed)
Addended by: Reesa Chew on: 08/27/2020 06:06 PM   Modules accepted: Orders

## 2020-08-30 DIAGNOSIS — D485 Neoplasm of uncertain behavior of skin: Secondary | ICD-10-CM | POA: Diagnosis not present

## 2020-08-30 DIAGNOSIS — D2362 Other benign neoplasm of skin of left upper limb, including shoulder: Secondary | ICD-10-CM | POA: Diagnosis not present

## 2020-09-16 ENCOUNTER — Encounter: Payer: Self-pay | Admitting: Internal Medicine

## 2020-09-16 ENCOUNTER — Ambulatory Visit: Payer: BC Managed Care – PPO | Admitting: Internal Medicine

## 2020-09-16 ENCOUNTER — Other Ambulatory Visit: Payer: Self-pay

## 2020-09-16 VITALS — BP 142/84 | HR 81 | Ht 69.0 in | Wt 316.6 lb

## 2020-09-16 DIAGNOSIS — E782 Mixed hyperlipidemia: Secondary | ICD-10-CM

## 2020-09-16 DIAGNOSIS — R002 Palpitations: Secondary | ICD-10-CM | POA: Diagnosis not present

## 2020-09-16 DIAGNOSIS — I471 Supraventricular tachycardia: Secondary | ICD-10-CM

## 2020-09-16 DIAGNOSIS — R400 Somnolence: Secondary | ICD-10-CM

## 2020-09-16 DIAGNOSIS — R0683 Snoring: Secondary | ICD-10-CM

## 2020-09-16 LAB — LIPID PANEL
Chol/HDL Ratio: 3.9 ratio (ref 0.0–4.4)
Cholesterol, Total: 199 mg/dL (ref 100–199)
HDL: 51 mg/dL (ref >39)
LDL Chol Calc (NIH): 129 mg/dL — ABNORMAL HIGH (ref 0–99)
Triglycerides: 106 mg/dL (ref 0–149)
VLDL Cholesterol Cal: 19 mg/dL (ref 5–40)

## 2020-09-16 MED ORDER — METOPROLOL TARTRATE 25 MG PO TABS
25.0000 mg | ORAL_TABLET | Freq: Two times a day (BID) | ORAL | 3 refills | Status: DC | PRN
Start: 2020-09-16 — End: 2021-09-05

## 2020-09-16 NOTE — Patient Instructions (Signed)
Medication Instructions:  MAY TAKE 25mg  METOPROLOL AS NEEDED TWICE DAILY FOR PALPITATIONS, IF YOU HAVE SYMPTOMATIC PALPITATIONS YOU MAY TAKE 25mg  and IF NO IMPROVEMENT IN MAY TAKE ANOTHER 25mg . DO NOT EXCEED 3 DOSES IN ONE HOUR WITH OUT CALLING OUR OFFICE PRIOR.  *If you need a refill on your cardiac medications before your next appointment, please call your pharmacy*  Lab Work: LIPID PANEL-TODAY  If you have labs (blood work) drawn today and your tests are completely normal, you will receive your results only by: MyChart Message (if you have MyChart) OR . A paper copy in the mail If you have any lab test that is abnormal or we need to change your treatment, we will call you to review the results.  Follow-Up: At [facility], you and your health needs are our priority.  As part of our continuing mission to provide you with exceptional heart care, we have created designated Provider Care Teams.  These Care Teams include your primary Cardiologist (physician) and Advanced Practice Providers (APPs -  Physician Assistants and Nurse Practitioners) who all work together to provide you with the care you need, when you need it.  Your next appointment:   6 month(s)  The format for your next appointment:   In Person  Provider:   , MD  Other Instructions  Mediterranean Diet A Mediterranean diet refers to food and lifestyle choices that are based on the traditions of countries located on the [place]. This way of eating has been shown to help prevent certain conditions and improve outcomes for people who have chronic diseases, like kidney disease and heart disease. What are tips for following this plan? Lifestyle  Cook and eat meals together with your family, when possible.  Drink enough fluid to keep your urine clear or pale yellow.  Be physically active every day. This includes: ? Aerobic exercise like running or swimming. ? Leisure activities like  gardening, walking, or housework.  Get 7-8 hours of sleep each night.  If recommended by your health care provider, drink red wine in moderation. This means 1 glass a day for nonpregnant women and 2 glasses a day for men. A glass of wine equals 5 oz (150 mL). Reading food labels   Check the serving size of packaged foods. For foods such as rice and pasta, the serving size refers to the amount of cooked product, not dry.  Check the total fat in packaged foods. Avoid foods that have saturated fat or trans fats.  Check the ingredients list for added sugars, such as corn syrup. Shopping  At the grocery store, buy most of your food from the areas near the walls of the store. This includes: ? Fresh fruits and vegetables (produce). ? Grains, beans, nuts, and seeds. Some of these may be available in unpackaged forms or large amounts (in bulk). ? Fresh seafood. ? Poultry and eggs. ? Low-fat dairy products.  Buy whole ingredients instead of prepackaged foods.  Buy fresh fruits and vegetables in-season from local farmers markets.  Buy frozen fruits and vegetables in resealable bags.  If you do not have access to quality fresh seafood, buy precooked frozen shrimp or canned fish, such as tuna, salmon, or sardines.  Buy small amounts of raw or cooked vegetables, salads, or olives from the deli or salad bar at your store.  Stock your pantry so you always have certain foods on hand, such as olive oil, canned tuna, canned tomatoes, rice, pasta, and beans. Cooking  Cook foods with extra-virgin olive oil instead of using butter or other vegetable oils.  Have meat as a side dish, and have vegetables or grains as your main dish. This means having meat in small portions or adding small amounts of meat to foods like pasta or stew.  Use beans or vegetables instead of meat in common dishes like chili or lasagna.  Experiment with different cooking methods. Try roasting or broiling vegetables instead  of steaming or sauteing them.  Add frozen vegetables to soups, stews, pasta, or rice.  Add nuts or seeds for added healthy fat at each meal. You can add these to yogurt, salads, or vegetable dishes.  Marinate fish or vegetables using olive oil, lemon juice, garlic, and fresh herbs. Meal planning   Plan to eat 1 vegetarian meal one day each week. Try to work up to 2 vegetarian meals, if possible.  Eat seafood 2 or more times a week.  Have healthy snacks readily available, such as: ? Vegetable sticks with hummus. ? [place] yogurt. ? Fruit and nut trail mix.  Eat balanced meals throughout the week. This includes: ? Fruit: 2-3 servings a day ? Vegetables: 4-5 servings a day ? Low-fat dairy: 2 servings a day ? Fish, poultry, or lean meat: 1 serving a day ? Beans and legumes: 2 or more servings a week ? Nuts and seeds: 1-2 servings a day ? Whole grains: 6-8 servings a day ? Extra-virgin olive oil: 3-4 servings a day  Limit red meat and sweets to only a few servings a month What are my food choices?  Mediterranean diet ? Recommended  Grains: Whole-grain pasta. Brown rice. Bulgar wheat. Polenta. Couscous. Whole-wheat bread. Orpah Cobb.  Vegetables: Artichokes. Beets. Broccoli. Cabbage. Carrots. Eggplant. Green beans. Chard. Kale. Spinach. Onions. Leeks. Peas. Squash. Tomatoes. Peppers. Radishes.  Fruits: Apples. Apricots. Avocado. Berries. Bananas. Cherries. Dates. Figs. Grapes. Lemons. Melon. Oranges. Peaches. Plums. Pomegranate.  Meats and other protein foods: Beans. Almonds. Sunflower seeds. Pine nuts. Peanuts. Cod. Salmon. Scallops. Shrimp. Tuna. Tilapia. Clams. Oysters. Eggs.  Dairy: Low-fat milk. Cheese. Greek yogurt.  Beverages: Water. Red wine. Herbal tea.  Fats and oils: Extra virgin olive oil. Avocado oil. Grape seed oil.  Sweets and desserts: [place] yogurt with honey. Baked apples. Poached pears. Trail mix.  Seasoning and other foods: Basil. Cilantro.  Coriander. Cumin. Mint. Parsley. Sage. Rosemary. Tarragon. Garlic. Oregano. Thyme. Pepper. Balsalmic vinegar. Tahini. Hummus. Tomato sauce. Olives. Mushrooms. ? Limit these  Grains: Prepackaged pasta or rice dishes. Prepackaged cereal with added sugar.  Vegetables: Deep fried potatoes (french fries).  Fruits: Fruit canned in syrup.  Meats and other protein foods: Beef. Pork. Lamb. Poultry with skin. Hot dogs. Tomasa Blase.  Dairy: Ice cream. Sour cream. Whole milk.  Beverages: Juice. Sugar-sweetened soft drinks. Beer. Liquor and spirits.  Fats and oils: Butter. Canola oil. Vegetable oil. Beef fat (tallow). Lard.  Sweets and desserts: Cookies. Cakes. Pies. Candy.  Seasoning and other foods: Mayonnaise. Premade sauces and marinades. The items listed may not be a complete list. Talk with your dietitian about what dietary choices are right for you. Summary  The Mediterranean diet includes both food and lifestyle choices.  Eat a variety of fresh fruits and vegetables, beans, nuts, seeds, and whole grains.  Limit the amount of red meat and sweets that you eat.  Talk with your health care provider about whether it is safe for you to drink red wine in moderation. This means 1 glass a day for nonpregnant women and 2  glasses a day for men. A glass of wine equals 5 oz (150 mL). This information is not intended to replace advice given to you by your health care provider. Make sure you discuss any questions you have with your health care provider. Document Revised: 06/18/2016 Document Reviewed: 06/11/2016 Elsevier Patient Education  2020 [employer].

## 2020-09-16 NOTE — Progress Notes (Signed)
Cardiology Office Note:    Date:  09/16/2020   ID:  Ann Greene, DOB 10/14/1966, MRN [id number]  PCP:  Monica Bectonhekkekandam, Thomas J, MD  Cardiologist:  No primary care provider on file.  Electrophysiologist:  None   Referring MD: Monica Bectonhekkekandam, Thomas J,*   Chief Complaint/Reason for Referral: Follow-up palpitations and daytime fatigue   History of Present Illness:    Ann SharkMary Salser is a 54 y.o. female with a history of anxiety and panic disorder, hypothyroidism with a history of Hashimoto's disease, positive ANA, osteoarthritis of the right knee, hyperlipidemia, adult ADHD previously on Vyvanse, and excessive daytime sleepiness who presents for follow-up of palpitations noted to be symptomatic SVT on cardiac monitor.   Occasionally has palpitations with fast HR, 210 on apple watch. Likely represents known SVT. Uses metoprolol 25 mg prn. Not weekly palpitations. Maybe 1 x every 2 weeks.   Oct 26 - 4 and 5 pm. 1 hr. Used metoprolol 25 mg. We discussed planning for for 2-3 prn doses of metop if happens again.   Her cpap titration planned for Dec 17. Found to have severe OSA on sleep study 07/18/20.  No chest pain or SOB. No syncope.  Past Medical History:  Diagnosis Date  . Thyroid disease     Past Surgical History:  Procedure Laterality Date  . CHOLECYSTECTOMY  1996  . TONSILLECTOMY      Current Medications: Current Meds  Medication Sig  . DULoxetine (CYMBALTA) 60 MG capsule TAKE 1 CAPSULE (60 MG TOTAL) BY MOUTH DAILY. DUE FOR FOLLOW UP.  . levothyroxine (SYNTHROID) 150 MCG tablet TAKE 1 TABLET BY MOUTH DAILY BEFORE BREAKFAST.  . metoprolol tartrate (LOPRESSOR) 25 MG tablet Take 1 tablet (25 mg total) by mouth 2 (two) times daily.     Allergies:   Patient has no known allergies.   Social History   Tobacco Use  . Smoking status: Never Smoker  . Smokeless tobacco: Never Used  Substance Use Topics  . Alcohol use: Yes    Alcohol/week: 0.0 standard drinks    Comment: occasional   . Drug use: No     Family History: The patient's family history includes Alcohol abuse in her brother, father, and paternal uncle; Cancer in her brother and paternal uncle; Heart attack in her father and maternal grandmother; Suicidality in her brother.  ROS:   Please see the history of present illness.    All other systems reviewed and are negative.  EKGs/Labs/Other Studies Reviewed:    The following studies were reviewed today:  EKG:  NSR, 81 bpm.   Recent Labs: 01/29/2020: ALT 12; BUN 9; Creat 0.54; Hemoglobin 13.5; Platelets 437; Potassium 4.6; Sodium 138; TSH 0.21  Recent Lipid Panel    Component Value Date/Time   CHOL 210 (H) 03/16/2019 0842   TRIG 135 03/16/2019 0842   HDL 52 03/16/2019 0842   CHOLHDL 4.0 03/16/2019 0842   VLDL 17 12/19/2015 0810   LDLCALC 133 (H) 03/16/2019 0842    Physical Exam:    VS:  BP (!) 142/84   Pulse 81   Ht 5\' 9"  (1.753 m)   Wt (!) 316 lb 9.6 oz (143.6 kg)   BMI 46.75 kg/m     Wt Readings from Last 5 Encounters:  09/16/20 (!) 316 lb 9.6 oz (143.6 kg)  07/18/20 (!) 305 lb (138.3 kg)  03/20/20 (!) 306 lb 3.2 oz (138.9 kg)  02/22/20 (!) 305 lb 3.2 oz (138.4 kg)  01/29/20 299 lb (135.6 kg)    Constitutional:  No acute distress Eyes: sclera non-icteric, normal conjunctiva and lids ENMT: normal dentition, moist mucous membranes Cardiovascular: regular rhythm, normal rate, no murmurs. S1 and S2 normal. Radial pulses normal bilaterally. No jugular venous distention.  Respiratory: clear to auscultation bilaterally GI : normal bowel sounds, soft and nontender. No distention.   MSK: extremities warm, well perfused. No edema.  NEURO: grossly nonfocal exam, moves all extremities. PSYCH: alert and oriented x 3, normal mood and affect.   ASSESSMENT:    1. SVT (supraventricular tachycardia) (HCC)   2. Palpitations   3. Hyperlipidemia, mixed   4. Daytime somnolence   5. Snoring    PLAN:    SVT (supraventricular tachycardia) (HCC) -  Plan: EKG 12-Lead Palpitations - continue metoprolol 25 mg BID, and as needed twice a day for additional palpitations or prolonged episode of SVT. Hopefully treatment of sleep apnea will lessen the burden of these episodes.  Hyperlipidemia, mixed - Plan: Lipid panel - last lipid panel not optimized, LDL 133. Will recheck lipids and consider medication therapy if no change. ASCVD risk score is 2% in 10 years so we can discuss further lifestyle modification vs medication therapy per patient preference. Can also consider calcium scoring. She has made dietary changes independently.   Daytime somnolence Snoring - severe OSA.  - cpap titration pending.  CV risk reduction - reviewed diet and lifestyle today. Mediterranean diet recommended, or plant based diet.   Total time of encounter: 30 minutes total time of encounter, including 18 minutes spent in face-to-face patient care on the date of this encounter. This time includes coordination of care and counseling regarding above mentioned problem list. Remainder of non-face-to-face time involved reviewing chart documents/testing relevant to the patient encounter and documentation in the medical record. I have independently reviewed documentation from referring provider.   Weston Brass, MD [place]  [facility]    Medication Adjustments/Labs and Tests Ordered: Current medicines are reviewed at length with the patient today.  Concerns regarding medicines are outlined above.   Orders Placed This Encounter  Procedures  . Lipid panel  . EKG 12-Lead    Meds ordered this encounter  Medications  . metoprolol tartrate (LOPRESSOR) 25 MG tablet    Sig: Take 1 tablet (25 mg total) by mouth 2 (two) times daily as needed. MAY TAKE TWICE DAILY PRN (AS NEEDED) FOR PALPITATIONS    Dispense:  60 tablet    Refill:  3    Patient Instructions  Medication Instructions:  MAY TAKE 25mg  METOPROLOL AS NEEDED TWICE DAILY FOR PALPITATIONS, IF YOU HAVE  SYMPTOMATIC PALPITATIONS YOU MAY TAKE 25mg  and IF NO IMPROVEMENT IN MAY TAKE ANOTHER 25mg . DO NOT EXCEED 3 DOSES IN ONE HOUR WITH OUT CALLING OUR OFFICE PRIOR.  *If you need a refill on your cardiac medications before your next appointment, please call your pharmacy*  Lab Work: LIPID PANEL-TODAY  If you have labs (blood work) drawn today and your tests are completely normal, you will receive your results only by: MyChart Message (if you have MyChart) OR . A paper copy in the mail If you have any lab test that is abnormal or we need to change your treatment, we will call you to review the results.  Follow-Up: At [facility], you and your health needs are our priority.  As part of our continuing mission to provide you with exceptional heart care, we have created designated Provider Care Teams.  These Care Teams include your primary Cardiologist (physician) and Advanced Practice  Providers (APPs -  Physician Assistants and Nurse Practitioners) who all work together to provide you with the care you need, when you need it.  Your next appointment:   6 month(s)  The format for your next appointment:   In Person  Provider:   Weston Brass, MD  Other Instructions  Mediterranean Diet A Mediterranean diet refers to food and lifestyle choices that are based on the traditions of countries located on the [place]. This way of eating has been shown to help prevent certain conditions and improve outcomes for people who have chronic diseases, like kidney disease and heart disease. What are tips for following this plan? Lifestyle  Cook and eat meals together with your family, when possible.  Drink enough fluid to keep your urine clear or pale yellow.  Be physically active every day. This includes: ? Aerobic exercise like running or swimming. ? Leisure activities like gardening, walking, or housework.  Get 7-8 hours of sleep each night.  If recommended by your health care  provider, drink red wine in moderation. This means 1 glass a day for nonpregnant women and 2 glasses a day for men. A glass of wine equals 5 oz (150 mL). Reading food labels   Check the serving size of packaged foods. For foods such as rice and pasta, the serving size refers to the amount of cooked product, not dry.  Check the total fat in packaged foods. Avoid foods that have saturated fat or trans fats.  Check the ingredients list for added sugars, such as corn syrup. Shopping  At the grocery store, buy most of your food from the areas near the walls of the store. This includes: ? Fresh fruits and vegetables (produce). ? Grains, beans, nuts, and seeds. Some of these may be available in unpackaged forms or large amounts (in bulk). ? Fresh seafood. ? Poultry and eggs. ? Low-fat dairy products.  Buy whole ingredients instead of prepackaged foods.  Buy fresh fruits and vegetables in-season from local farmers markets.  Buy frozen fruits and vegetables in resealable bags.  If you do not have access to quality fresh seafood, buy precooked frozen shrimp or canned fish, such as tuna, salmon, or sardines.  Buy small amounts of raw or cooked vegetables, salads, or olives from the deli or salad bar at your store.  Stock your pantry so you always have certain foods on hand, such as olive oil, canned tuna, canned tomatoes, rice, pasta, and beans. Cooking  Cook foods with extra-virgin olive oil instead of using butter or other vegetable oils.  Have meat as a side dish, and have vegetables or grains as your main dish. This means having meat in small portions or adding small amounts of meat to foods like pasta or stew.  Use beans or vegetables instead of meat in common dishes like chili or lasagna.  Experiment with different cooking methods. Try roasting or broiling vegetables instead of steaming or sauteing them.  Add frozen vegetables to soups, stews, pasta, or rice.  Add nuts or seeds  for added healthy fat at each meal. You can add these to yogurt, salads, or vegetable dishes.  Marinate fish or vegetables using olive oil, lemon juice, garlic, and fresh herbs. Meal planning   Plan to eat 1 vegetarian meal one day each week. Try to work up to 2 vegetarian meals, if possible.  Eat seafood 2 or more times a week.  Have healthy snacks readily available, such as: ? Vegetable sticks with hummus. ?  Greek yogurt. ? Fruit and nut trail mix.  Eat balanced meals throughout the week. This includes: ? Fruit: 2-3 servings a day ? Vegetables: 4-5 servings a day ? Low-fat dairy: 2 servings a day ? Fish, poultry, or lean meat: 1 serving a day ? Beans and legumes: 2 or more servings a week ? Nuts and seeds: 1-2 servings a day ? Whole grains: 6-8 servings a day ? Extra-virgin olive oil: 3-4 servings a day  Limit red meat and sweets to only a few servings a month What are my food choices?  Mediterranean diet ? Recommended  Grains: Whole-grain pasta. Brown rice. Bulgar wheat. Polenta. Couscous. Whole-wheat bread. Orpah Cobb.  Vegetables: Artichokes. Beets. Broccoli. Cabbage. Carrots. Eggplant. Green beans. Chard. Kale. Spinach. Onions. Leeks. Peas. Squash. Tomatoes. Peppers. Radishes.  Fruits: Apples. Apricots. Avocado. Berries. Bananas. Cherries. Dates. Figs. Grapes. Lemons. Melon. Oranges. Peaches. Plums. Pomegranate.  Meats and other protein foods: Beans. Almonds. Sunflower seeds. Pine nuts. Peanuts. Cod. Salmon. Scallops. Shrimp. Tuna. Tilapia. Clams. Oysters. Eggs.  Dairy: Low-fat milk. Cheese. Greek yogurt.  Beverages: Water. Red wine. Herbal tea.  Fats and oils: Extra virgin olive oil. Avocado oil. Grape seed oil.  Sweets and desserts: [place] yogurt with honey. Baked apples. Poached pears. Trail mix.  Seasoning and other foods: Basil. Cilantro. Coriander. Cumin. Mint. Parsley. Sage. Rosemary. Tarragon. Garlic. Oregano. Thyme. Pepper. Balsalmic vinegar. Tahini.  Hummus. Tomato sauce. Olives. Mushrooms. ? Limit these  Grains: Prepackaged pasta or rice dishes. Prepackaged cereal with added sugar.  Vegetables: Deep fried potatoes (french fries).  Fruits: Fruit canned in syrup.  Meats and other protein foods: Beef. Pork. Lamb. Poultry with skin. Hot dogs. Tomasa Blase.  Dairy: Ice cream. Sour cream. Whole milk.  Beverages: Juice. Sugar-sweetened soft drinks. Beer. Liquor and spirits.  Fats and oils: Butter. Canola oil. Vegetable oil. Beef fat (tallow). Lard.  Sweets and desserts: Cookies. Cakes. Pies. Candy.  Seasoning and other foods: Mayonnaise. Premade sauces and marinades. The items listed may not be a complete list. Talk with your dietitian about what dietary choices are right for you. Summary  The Mediterranean diet includes both food and lifestyle choices.  Eat a variety of fresh fruits and vegetables, beans, nuts, seeds, and whole grains.  Limit the amount of red meat and sweets that you eat.  Talk with your health care provider about whether it is safe for you to drink red wine in moderation. This means 1 glass a day for nonpregnant women and 2 glasses a day for men. A glass of wine equals 5 oz (150 mL). This information is not intended to replace advice given to you by your health care provider. Make sure you discuss any questions you have with your health care provider. Document Revised: 06/18/2016 Document Reviewed: 06/11/2016 Elsevier Patient Education  2020 [employer].

## 2020-10-09 ENCOUNTER — Other Ambulatory Visit: Payer: Self-pay | Admitting: Sports Medicine

## 2020-10-09 DIAGNOSIS — F411 Generalized anxiety disorder: Secondary | ICD-10-CM

## 2020-10-18 ENCOUNTER — Encounter (HOSPITAL_BASED_OUTPATIENT_CLINIC_OR_DEPARTMENT_OTHER): Payer: BC Managed Care – PPO | Admitting: Cardiology

## 2020-10-23 ENCOUNTER — Ambulatory Visit (HOSPITAL_BASED_OUTPATIENT_CLINIC_OR_DEPARTMENT_OTHER): Payer: BC Managed Care – PPO | Attending: Cardiology | Admitting: Cardiology

## 2020-10-23 ENCOUNTER — Other Ambulatory Visit: Payer: Self-pay

## 2020-10-23 VITALS — Ht 69.0 in | Wt 315.0 lb

## 2020-10-23 DIAGNOSIS — R400 Somnolence: Secondary | ICD-10-CM

## 2020-10-23 DIAGNOSIS — G4733 Obstructive sleep apnea (adult) (pediatric): Secondary | ICD-10-CM

## 2020-10-26 NOTE — Procedures (Signed)
    Patient Name: Ann Greene, Ann Greene Date: 10/23/2020 Gender: Female D.O.B: 06/26/1966 Age (years): 17 Referring Provider: Armanda Magic MD, ABSM Height (inches): 69 Interpreting Physician: Armanda Magic MD, ABSM Weight (lbs): 315 RPSGT: Shelah Lewandowsky BMI: 47 MRN: [id number] Neck Size: 17.50  CLINICAL INFORMATION The patient is referred for a CPAP titration to treat sleep apnea.  SLEEP STUDY TECHNIQUE As per the AASM Manual for the Scoring of Sleep and Associated Events v2.3 (April 2016) with a hypopnea requiring 4% desaturations.  The channels recorded and monitored were frontal, central and occipital EEG, electrooculogram (EOG), submentalis EMG (chin), nasal and oral airflow, thoracic and abdominal wall motion, anterior tibialis EMG, snore microphone, electrocardiogram, and pulse oximetry. Continuous positive airway pressure (CPAP) was initiated at the beginning of the study and titrated to treat sleep-disordered breathing.  MEDICATIONS Medications self-administered by patient taken the night of the study : N/A  TECHNICIAN COMMENTS Comments added by technician: NONE Comments added by scorer: N/A  RESPIRATORY PARAMETERS Optimal PAP Pressure (cm):16  AHI at Optimal Pressure (/hr):0.0 Overall Minimal O2 (%):85.0  Supine % at Optimal Pressure (%):0 Minimal O2 at Optimal Pressure (%): 94.0   SLEEP ARCHITECTURE The study was initiated at 10:32:28 PM and ended at 5:12:30 AM.  Sleep onset time was 42.4 minutes and the sleep efficiency was 69.2%%. The total sleep time was 277 minutes.  The patient spent 5.8%% of the night in stage N1 sleep, 62.3% in stage N2 sleep, 0.0% in stage N3 and 32% in REM.Stage REM latency was 74.0 minutes  Wake after sleep onset was 80.6. Alpha intrusion was absent. Supine sleep was 29.60%.  CARDIAC DATA The 2 lead EKG demonstrated sinus rhythm. The mean heart rate was 79.0 beats per minute. Other EKG findings include: None. LEG MOVEMENT DATA The  total Periodic Limb Movements of Sleep (PLMS) were 0. The PLMS index was 0.0. A PLMS index of <15 is considered normal in adults.  IMPRESSIONS - The optimal PAP pressure was 16 cm of water. - Central sleep apnea was not noted during this titration (CAI = 0.4/h). - Moderate oxygen desaturations were observed during this titration (min O2 = 85.0%). - The patient snored with moderate snoring volume during this titration study. - No cardiac abnormalities were observed during this study. - Clinically significant periodic limb movements were not noted during this study. Arousals associated with PLMs were rare.  DIAGNOSIS - Obstructive Sleep Apnea (G47.33)  RECOMMENDATIONS - Trial of CPAP therapy on 16 cm H2O with a Small size Resmed Full Face Mask AirFit F20 mask and heated humidification. - Avoid alcohol, sedatives and other CNS depressants that may worsen sleep apnea and disrupt normal sleep architecture. - Sleep hygiene should be reviewed to assess factors that may improve sleep quality. - Weight management and regular exercise should be initiated or continued. - Return to Sleep Center for re-evaluation after 8 weeks of therapy  [Electronically signed] 10/26/2020 10:08 PM  Armanda Magic MD, ABSM Diplomate, American Board of Sleep Medicine

## 2020-10-28 ENCOUNTER — Telehealth: Payer: Self-pay | Admitting: *Deleted

## 2020-10-28 NOTE — Telephone Encounter (Signed)
-----   Message from Quintella Reichert, MD sent at 10/26/2020 10:11 PM EST ----- Please let patient know that they had a successful PAP titration and let DME know that orders are in EPIC.  Please set up 8 week OV with me.

## 2020-10-28 NOTE — Telephone Encounter (Signed)
Informed patient of sleep study results and patient understanding was verbalized. Patient understands her sleep study showed they had a successful PAP titration and let DME know that orders are in EPIC. Please set up 8 week OV with me.   Upon patient request DME selection is [facility]. Patient understands she/he will be contacted by [facility] to set up her/he cpap. Patient understands to call if [facility] does not contact her/he with new setup in a timely manner. Patient understands they will be called once confirmation has been received from [facility] that they have received their new machine to schedule 10 week follow up appointment.   [facility] notified of new cpap order  Please add to airview Patient was grateful for the call and thanked me. 

## 2020-11-06 ENCOUNTER — Telehealth: Payer: Self-pay

## 2020-11-06 NOTE — Telephone Encounter (Signed)
Patient called stating she has been home sick for a week just sleeping. She reports fatigue, cough, runny nose (clear), sore throat, headache. She stated that she was afraid to take any OTC decongestants with her Cymbalta so she has only been taking Emergen-C. She stated that she cannot find a pharmacy to test her until next week. Advised patient to get a home test if she can find one and test that way. She stated that she would look for one.  Please advise.

## 2020-11-06 NOTE — Telephone Encounter (Signed)
Spoke with patient, she is aware that she can take OTC decongestants with her Cymbalta.   Per Dr. Karie Schwalbe, patient needs to be scheduled for a drive up COVID test in the next couple days. Please call to schedule.

## 2020-11-06 NOTE — Telephone Encounter (Signed)
Sounds good, she can take Cymbalta with any of the over-the-counter decongestants, we can also do a drive-by swab here.

## 2020-11-07 ENCOUNTER — Ambulatory Visit (INDEPENDENT_AMBULATORY_CARE_PROVIDER_SITE_OTHER): Payer: BLUE CROSS/BLUE SHIELD | Admitting: Family Medicine

## 2020-11-07 DIAGNOSIS — Z20822 Contact with and (suspected) exposure to covid-19: Secondary | ICD-10-CM

## 2020-11-07 NOTE — Telephone Encounter (Signed)
Appointment scheduled for today 

## 2020-11-09 LAB — SARS-COV-2, NAA 2 DAY TAT

## 2020-11-09 LAB — NOVEL CORONAVIRUS, NAA: SARS-CoV-2, NAA: DETECTED — AB

## 2020-11-11 ENCOUNTER — Ambulatory Visit: Payer: BC Managed Care – PPO

## 2020-11-12 ENCOUNTER — Encounter (HOSPITAL_BASED_OUTPATIENT_CLINIC_OR_DEPARTMENT_OTHER): Payer: BC Managed Care – PPO | Admitting: Cardiology

## 2021-01-17 DIAGNOSIS — L814 Other melanin hyperpigmentation: Secondary | ICD-10-CM | POA: Diagnosis not present

## 2021-01-17 DIAGNOSIS — Z85828 Personal history of other malignant neoplasm of skin: Secondary | ICD-10-CM | POA: Diagnosis not present

## 2021-01-17 DIAGNOSIS — D229 Melanocytic nevi, unspecified: Secondary | ICD-10-CM | POA: Diagnosis not present

## 2021-01-17 DIAGNOSIS — L820 Inflamed seborrheic keratosis: Secondary | ICD-10-CM | POA: Diagnosis not present

## 2021-01-17 DIAGNOSIS — L905 Scar conditions and fibrosis of skin: Secondary | ICD-10-CM | POA: Diagnosis not present

## 2021-01-18 ENCOUNTER — Other Ambulatory Visit: Payer: Self-pay | Admitting: Sports Medicine

## 2021-01-18 DIAGNOSIS — E034 Atrophy of thyroid (acquired): Secondary | ICD-10-CM

## 2021-01-18 DIAGNOSIS — F411 Generalized anxiety disorder: Secondary | ICD-10-CM

## 2021-01-30 ENCOUNTER — Other Ambulatory Visit: Payer: Self-pay

## 2021-01-30 ENCOUNTER — Ambulatory Visit (INDEPENDENT_AMBULATORY_CARE_PROVIDER_SITE_OTHER): Payer: 59 | Admitting: Family Medicine

## 2021-01-30 ENCOUNTER — Encounter (HOSPITAL_BASED_OUTPATIENT_CLINIC_OR_DEPARTMENT_OTHER): Payer: Self-pay | Admitting: Family Medicine

## 2021-01-30 ENCOUNTER — Ambulatory Visit (HOSPITAL_BASED_OUTPATIENT_CLINIC_OR_DEPARTMENT_OTHER)
Admission: RE | Admit: 2021-01-30 | Discharge: 2021-01-30 | Disposition: A | Discharge status: Home / Self Care | Payer: 59 | Source: Ambulatory Visit | Attending: Family Medicine | Admitting: Family Medicine

## 2021-01-30 VITALS — BP 130/86 | HR 87 | Ht 69.0 in | Wt 314.4 lb

## 2021-01-30 DIAGNOSIS — M1711 Unilateral primary osteoarthritis, right knee: Secondary | ICD-10-CM | POA: Insufficient documentation

## 2021-01-30 DIAGNOSIS — S46019A Strain of muscle(s) and tendon(s) of the rotator cuff of unspecified shoulder, initial encounter: Secondary | ICD-10-CM | POA: Insufficient documentation

## 2021-01-30 DIAGNOSIS — S46012A Strain of muscle(s) and tendon(s) of the rotator cuff of left shoulder, initial encounter: Secondary | ICD-10-CM | POA: Diagnosis not present

## 2021-01-30 DIAGNOSIS — M170 Bilateral primary osteoarthritis of knee: Secondary | ICD-10-CM | POA: Diagnosis not present

## 2021-01-30 DIAGNOSIS — X58XXXA Exposure to other specified factors, initial encounter: Secondary | ICD-10-CM | POA: Insufficient documentation

## 2021-01-30 DIAGNOSIS — M1712 Unilateral primary osteoarthritis, left knee: Secondary | ICD-10-CM | POA: Diagnosis not present

## 2021-01-30 DIAGNOSIS — S4992XA Unspecified injury of left shoulder and upper arm, initial encounter: Secondary | ICD-10-CM | POA: Diagnosis not present

## 2021-01-30 DIAGNOSIS — M25512 Pain in left shoulder: Secondary | ICD-10-CM | POA: Diagnosis not present

## 2021-01-30 NOTE — Patient Instructions (Addendum)
  Medication Instructions:  Your physician recommends that you continue on your current medications as directed. Please refer to the Current Medication list given to you today. --If you need a refill on any your medications before your next appointment, please call your pharmacy first. If no refills are authorized on file call the office.--  Referrals/Procedures/Imaging: A referral has been placed for you to Physical Therapy for your Right knee and Left shoulder at [facility]. Someone from the scheduling department will be in contact with you in regards to coordinating your consultation. If you do not hear from any of the schedulers within 7-10 business days please give our office a call.  Your physician recommends that you have an X-ray of your Right Knee and Left Shoulder.  X-rays use invisible electromagnetic energy beams to produce images of internal tissues, bones, and organs on film or digital media. Standard X-rays are performed for many reasons, including diagnosing tumors or bone injuries.    You may have these images done at the Imaging Center located at [facility] on the ground floor, [phone]. They are a walk-in imaging facility. The hours of operation are:   Monday -- 7:30 AM - 5:00 PM Tuesday -- 7:30 AM - 5:00 PM Wednesday -- 7:30 AM - 5:00 PM Thursday -- 7:30 AM - 5:00 PM Friday -- 7:30 AM - 5:00 PM Saturday -- Closed Sunday -- Closed  Follow-Up: Your next appointment:   Your physician recommends that you schedule a follow-up appointment in: 1 MONTH with Dr. De Peru  Thanks for letting us be apart of your health journey!!  Primary Care and Sports Medicine   Dr. de Peru and Shawna Clamp, DNP, AGNP  We recommend signing up for the patient portal called "MyChart".  Sign up information is provided on this After Visit Summary.  MyChart is used to connect with patients for Virtual Visits (Telemedicine).  Patients are able to view  lab/test results, encounter notes, upcoming appointments, etc.  Non-urgent messages can be sent to your provider as well.   To learn more about what you can do with MyChart, go to [email or web address].

## 2021-01-30 NOTE — Assessment & Plan Note (Signed)
History and exam most consistent with strain of rotator cuff, specifically supraspinatus, possible component of impingement, less likely labral tear Will order x-rays to evaluate further Recommend initial treatment with physical therapy, conservative measures including OTC medications, topical therapies as desired Could consider CSI patient wishes to hold off at this time which is reasonable Follow-up in about 4 weeks or sooner as needed

## 2021-01-30 NOTE — Progress Notes (Signed)
New Patient Office Visit  Subjective:  Patient ID: Ann Greene, female    DOB: 03/30/1966  Age: 55 y.o. MRN: [id number]  CC:  Chief Complaint  Patient presents with  . Establish Care  . Knee Pain    Right knee pain that radiates to right hip joint and lower back  . Shoulder Pain    Left shoulder pain x 1 month. Pt states she lifted heavy luggage and irritated the shoulder and now has an "achy discomfort" that is worse with certain movements  . Hip Pain    Right hip pain x several months that is worsening. Pt states she thinks it is stemming from right knee pain    HPI Ann Greene is a 55 year old female presenting for evaluation of right knee and left shoulder pain.  She also plans to establish here for primary care.  Right knee pain: Has been present for some years.  Most recently has seen Dr. [name] for treatment of this.  In the past she was evaluated at [facility] in [place] with reported MRI at that time which showed a medial meniscal tear.  She has been managing with conservative measures including physical therapy, home exercise program, NSAIDs.  Most recent x-rays were in 2018 which showed osteoarthritis.  She has noticed some increasing pain which is primarily over medial aspect of knee.  She notes the pain will be worse with certain movements.  She has been icing and using ibuprofen as needed to help with pain control.  Will have occasional swelling, no instability or mechanical symptoms reported.  Left shoulder pain: About 2 months ago she reports lifting heavy luggage up and felt sudden pain over anterior and lateral shoulder.  Pain currently described as an "achy discomfort".  Pain worse with certain movements, particularly with lifting arm straight up in front of her.  Patient is right-handed, denies any prior injuries to the left shoulder.  No new numbness or tingling since the injury.  Possibly feels that she has some associated weakness.  Past Medical  History:  Diagnosis Date  . Thyroid disease     Past Surgical History:  Procedure Laterality Date  . CHOLECYSTECTOMY  1996  . TONSILLECTOMY      Family History  Problem Relation Age of Onset  . Alcohol abuse Father   . Heart attack Father   . Alcohol abuse Brother   . Cancer Brother        testicular  . Suicidality Brother   . Alcohol abuse Paternal Uncle   . Cancer Paternal Uncle        colon  . Heart attack Maternal Grandmother     Social History   Socioeconomic History  . Marital status: Married    Spouse name: Not on file  . Number of children: Not on file  . Years of education: Not on file  . Highest education level: Not on file  Occupational History  . Not on file  Tobacco Use  . Smoking status: Never Smoker  . Smokeless tobacco: Never Used  Substance and Sexual Activity  . Alcohol use: Yes    Alcohol/week: 0.0 standard drinks    Comment: occasional  . Drug use: No  . Sexual activity: Yes  Other Topics Concern  . Not on file  Social History Narrative  . Not on file   Social Determinants of Health   Financial Resource Strain: Not on file  Food Insecurity: Not on file  Transportation Needs: Not on file  Physical  Activity: Not on file  Stress: Not on file  Social Connections: Not on file  Intimate Partner Violence: Not on file    Objective:   Today's Vitals: BP 130/86   Pulse 87   Ht 5\' 9"  (1.753 m)   Wt (!) 314 lb 6.4 oz (142.6 kg)   SpO2 98%   BMI 46.43 kg/m   Physical Exam  Pleasant 55 year old female in no acute distress Left shoulder: Mild soreness with palpation along left trapezius, no tenderness palpation along clavicle, over AC joint.  Normal external rotation, mildly reduced internal rotation.  Normal strength with internal rotation, reduced strength and pain elicited with resisted external rotation.  Pain with resisted abduction. Positive empty can, positive Hawkins, positive Neer's, positive O'Brien's test Distal neurovascular  exam intact Right knee: Mild tenderness palpation along medial joint line, mild effusion. Mild tenderness to palpation in the area of pes anserine. No tenderness palpation along medial aspect of patella, negative patellar grind test Negative Lachman, negative anterior drawer and posterior drawer test Negative varus and valgus stress test Negative McMurray test  Assessment & Plan:   Problem List Items Addressed This Visit      Musculoskeletal and Integument   Primary osteoarthritis of right knee - Primary    Active, not at goal Reviewed report of prior right knee x-rays completed in January 2018 with findings of osteoarthritis, most evident in the medial compartment Based on history, presence of con commitment needle meniscal tear Discussed diagnosis and treatment options-patient wishes to proceed with referral to physical therapy, use of OTC medications, topical therapies Discussed role CSI if desired, patient would prefer to try other modalities first which is reasonable Will order x-rays to evaluate for any interval changes since prior x-ray 4 years ago Plan for follow-up in 4 weeks or sooner as needed      Relevant Orders   DG Knee Complete 4 Views Right   Ambulatory referral to Physical Therapy   DG Knee 1-2 Views Left   Rotator cuff strain    History and exam most consistent with strain of rotator cuff, specifically supraspinatus, possible component of impingement, less likely labral tear Will order x-rays to evaluate further Recommend initial treatment with physical therapy, conservative measures including OTC medications, topical therapies as desired Could consider CSI patient wishes to hold off at this time which is reasonable Follow-up in about 4 weeks or sooner as needed      Relevant Orders   DG Shoulder Left   Ambulatory referral to Physical Therapy      Outpatient Encounter Medications as of 01/30/2021  Medication Sig  . DULoxetine (CYMBALTA) 60 MG capsule TAKE  1 CAPSULE (60 MG TOTAL) BY MOUTH DAILY. DUE FOR FOLLOW UP.  . levothyroxine (SYNTHROID) 150 MCG tablet TAKE 1 TABLET BY MOUTH EVERY DAY BEFORE BREAKFAST  . metoprolol tartrate (LOPRESSOR) 25 MG tablet Take 1 tablet (25 mg total) by mouth 2 (two) times daily as needed. MAY TAKE TWICE DAILY PRN (AS NEEDED) FOR PALPITATIONS   No facility-administered encounter medications on file as of 01/30/2021.   Spent 45 minutes on this patient encounter, including preparation, chart review, face-to-face counseling with patient and coordination of care, and documentation of encounter  Follow-up: Return in about 1 month (around 03/01/2021) for Follow Up.   Murlene Revell J De 03/03/2021, MD

## 2021-01-30 NOTE — Assessment & Plan Note (Signed)
Active, not at goal Reviewed report of prior right knee x-rays completed in January 2018 with findings of osteoarthritis, most evident in the medial compartment Based on history, presence of con commitment needle meniscal tear Discussed diagnosis and treatment options-patient wishes to proceed with referral to physical therapy, use of OTC medications, topical therapies Discussed role CSI if desired, patient would prefer to try other modalities first which is reasonable Will order x-rays to evaluate for any interval changes since prior x-ray 4 years ago Plan for follow-up in 4 weeks or sooner as needed

## 2021-02-03 ENCOUNTER — Telehealth (HOSPITAL_BASED_OUTPATIENT_CLINIC_OR_DEPARTMENT_OTHER): Payer: Self-pay

## 2021-02-03 NOTE — Telephone Encounter (Signed)
-----   Message from Hosie Poisson Peru, MD sent at 01/31/2021  1:05 PM EDT ----- Shoulder X-ray overall reassuring - no obvious bony abnormalities, joint spacing is normal. Would continue with conservative measures, PT, OTC medications as needed.

## 2021-02-03 NOTE — Telephone Encounter (Signed)
Pt is aware and agreeable to plan Will forgo further Imaging studies at this time and will continue with initiating PT

## 2021-02-03 NOTE — Telephone Encounter (Signed)
Pt is aware and agreeable to normal Xray results (knees and shoulder) Pt states she is still having an immense amount of pain in the shoulder and thinks she may have re-injured it over the weekend Pt is agreeable to current plan of PT, but would like to know if Dr. De Peru sees any benefit in having additional imaging studies Will defer for advisement

## 2021-02-03 NOTE — Telephone Encounter (Signed)
-----   Message from Hosie Poisson Peru, MD sent at 02/03/2021  4:55 PM EDT ----- Would recommend continuing with PT evaluation and treatment and hold off on advanced imaging at this time. Be cautious with activities at home, use the arm as able but avoid lifting heavy items or overuse of the arm. ----- Message ----- From: Rebbeca Paul, CMA Sent: 02/03/2021   9:51 AM EDT To: Raymond J de Peru, MD  Pt is aware and agreeable to normal Xray results Pt states she is still having an immense amount of pain in the shoulder and thinks she may have re-injured it over the weekend Pt is agreeable to current plan of PT, but would like to know if Dr. Tommi Rumps Peru sees any benefit in having additional imaging studies Will defer for advisement

## 2021-02-10 ENCOUNTER — Encounter (HOSPITAL_BASED_OUTPATIENT_CLINIC_OR_DEPARTMENT_OTHER): Payer: Self-pay | Admitting: Physical Therapy

## 2021-02-10 ENCOUNTER — Ambulatory Visit (HOSPITAL_BASED_OUTPATIENT_CLINIC_OR_DEPARTMENT_OTHER): Payer: 59 | Attending: Family Medicine | Admitting: Physical Therapy

## 2021-02-10 ENCOUNTER — Other Ambulatory Visit: Payer: Self-pay

## 2021-02-10 DIAGNOSIS — M25561 Pain in right knee: Secondary | ICD-10-CM | POA: Diagnosis not present

## 2021-02-10 DIAGNOSIS — M25512 Pain in left shoulder: Secondary | ICD-10-CM | POA: Diagnosis not present

## 2021-02-10 DIAGNOSIS — M25551 Pain in right hip: Secondary | ICD-10-CM | POA: Diagnosis not present

## 2021-02-10 DIAGNOSIS — M545 Low back pain, unspecified: Secondary | ICD-10-CM | POA: Diagnosis not present

## 2021-02-10 DIAGNOSIS — M6281 Muscle weakness (generalized): Secondary | ICD-10-CM

## 2021-02-10 DIAGNOSIS — G8929 Other chronic pain: Secondary | ICD-10-CM | POA: Diagnosis not present

## 2021-02-10 NOTE — Therapy (Signed)
[facility] [address] [place], [place], [address] Phone: [phone]   Fax:  [phone]  Physical Therapy Evaluation  Patient Details  Name: Ann Greene MRN: [id number] Date of Birth: [birth year] Referring Provider (PT): de [place], MD   Encounter Date: 02/10/2021   PT End of Session - 02/10/21 1152    Visit Number 1    Number of Visits 17    Date for PT Re-Evaluation 04/11/21    Authorization Type AETNA   *no IONTO    PT Start Time 1148    PT Stop Time 1235    PT Time Calculation (min) 47 min    Activity Tolerance Patient tolerated treatment well    Behavior During Therapy [facility] for tasks assessed/performed           Past Medical History:  Diagnosis Date  . Thyroid disease     Past Surgical History:  Procedure Laterality Date  . CHOLECYSTECTOMY  1996  . TONSILLECTOMY      There were no vitals filed for this visit.    Subjective Assessment - 02/10/21 1153    Subjective Not 100% sure how I hurt my knee, was told I had a torn meniscus 12-14 yr ago. When I was in college I tried to stand I felt something pop and I needed help standing but it got better. Had MRI and wanted to hold on surgery. pain has progressed. shoulder, in Jan, was caring for mother. Was on the phone and went to lift luggage on the bed- when I turned my arm to lift it something popped. got a little better and then got worse. noted neck pain on Lt side. Is a [employer] member and goes to [facility]. I try to go to water aerobics. Leaving for vacation after next week. noted to have a lot of hip and lower back pain.    How long can you walk comfortably? 10-15 min    Patient Stated Goals hike in August through [place] a couple of miles    Currently in Pain? Yes    Pain Score 4     Pain Location Knee    Pain Orientation Right    Pain Descriptors / Indicators Aching    Pain Radiating Towards Rt hip and low back    Aggravating Factors  weight bearing    Pain  Relieving Factors rest    Multiple Pain Sites Yes    Pain Score 0    Pain Location Shoulder    Aggravating Factors  reaching, lifting    Pain Relieving Factors rest              [facility] PT Assessment - 02/10/21 0001      Assessment   Medical Diagnosis rt knee OA, Lt shoulder RC strain    Referring Provider (PT) de [place], MD    Hand Dominance Right    Prior Therapy [facility], knee last year      Precautions   Precautions None      Restrictions   Weight Bearing Restrictions No      Balance Screen   Has the patient fallen in the past 6 months No      Home Environment   Living Environment Private residence    Additional Comments stairs in home      Prior Function   Level of Independence Independent    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      Cognition   Overall Cognitive Status Within Functional Limits  for tasks assessed      Sensation   Additional Comments some tingling in hands after water aerobics      Posture/Postural Control   Posture Comments decr kyphosis, forward shoulders, Lt shoulder depression; Rt inom ant rotation      ROM / Strength   AROM / PROM / Strength Strength      Strength   Overall Strength Comments shoulder pain with all testing- demo 4/5    Strength Assessment Site Hip    Right/Left Hip Right;Left    Right Hip Flexion 4/5    Right Hip ABduction 5/5    Left Hip Flexion 4+/5    Left Hip ABduction 4-/5      Palpation   Palpation comment TTP Rt SIJ, tightness Lt upper trap                      Objective measurements completed on examination: See above findings.       [facility] Adult PT Treatment/Exercise - 02/10/21 0001      Exercises   Exercises Shoulder;Knee/Hip      Knee/Hip Exercises: Stretches   Other Knee/Hip Stretches Rt SKTC with Lt leg extended      Knee/Hip Exercises: Supine   Other Supine Knee/Hip Exercises ab set + adduction in hooklying-cues for core      Shoulder Exercises: Seated    Other Seated Exercises scapular retraction/resting posture      Manual Therapy   Manual therapy comments discussed self rolling of quads                  PT Education - 02/10/21 1448    Education Details anatomy of condition, POC, HEP, exercise form/rationale, rolling muscles, computer postures    Person(s) Educated Patient    Methods Explanation;Demonstration;Tactile cues;Verbal cues;Handout    Comprehension Verbalized understanding;Need further instruction;Returned demonstration;Verbal cues required;Tactile cues required            PT Short Term Goals - 02/10/21 1259      PT SHORT TERM GOAL #1   Title independent in posture corrections    Baseline leg crossing, upright posture on computer    Time 2    Period Weeks    Status New    Target Date 02/28/21             PT Long Term Goals - 02/10/21 1258      PT LONG TERM GOAL #1   Title 5/5 hip strength    Baseline see flowsheet    Time 8    Period Weeks    Status New    Target Date 04/11/21      PT LONG TERM GOAL #2   Title pt will be able to amb for at least 1 hour in prep for trip to [facility]    Baseline 10-15 min at eval    Time 8    Period Weeks    Status New    Target Date 04/11/21      PT LONG TERM GOAL #3   Title able to perform reaching and lifting activities without incr in shoulder pain    Baseline painful at eval    Time 8    Period Weeks    Status New    Target Date 04/11/21      PT LONG TERM GOAL #4   Title independent in long term HEP for strength & stability    Baseline will progress as appropriate    Time 8  Period Weeks    Status New    Target Date 04/11/21                  Plan - 02/10/21 1254    Clinical Impression Statement Shoulder s/s consistent with impingement and biceps tendon strain with spasp of upper traps. postural anomalies outlined in flowsheet with good strength but painful. Innominate rotation with spasm of Rt  quads placing incr pressure on knee.  Weakness found and will be addressed. will do 1 visit/week in pool starting next week. will benefit from skilled PT to address deficits and reach long term functional goals.    Personal Factors and Comorbidities Time since onset of injury/illness/exacerbation;Fitness;Profession    Examination-Activity Limitations Squat;Lift;Stairs;Bend;Stand;Locomotion Level;Caring for Others;Reach Overhead;Carry;Sit    Examination-Participation Restrictions Cleaning;Community Activity;Occupation;Other    Stability/Clinical Decision Making Evolving/Moderate complexity    Clinical Decision Making Moderate    Rehab Potential Good    PT Frequency 2x / week    PT Duration 8 weeks    PT Treatment/Interventions ADLs/Self Care Home Management;Cryotherapy;Ultrasound;Traction;Moist Heat;Electrical Stimulation;Functional mobility training;Neuromuscular re-education;Balance training;Therapeutic exercise;Therapeutic activities;Patient/family education;Manual techniques;Passive range of motion;Dry needling;Taping;Spinal Manipulations;Joint Manipulations;Iontophoresis 4mg /ml Dexamethasone    PT Next Visit Plan DN Upper traps, core activation, periscap strength    PT Home Exercise Plan [id number]    Consulted and Agree with Plan of Care Patient           Patient will benefit from skilled therapeutic intervention in order to improve the following deficits and impairments:  Difficulty walking,Increased muscle spasms,Improper body mechanics,Pain,Postural dysfunction,Impaired UE functional use,Decreased strength,Decreased activity tolerance  Visit Diagnosis: Chronic pain of right knee - Plan: PT plan of care cert/re-cert  Chronic left shoulder pain - Plan: PT plan of care cert/re-cert  Muscle weakness (generalized) - Plan: PT plan of care cert/re-cert  Chronic bilateral low back pain without sciatica - Plan: PT plan of care cert/re-cert     Problem List Patient Active Problem List   Diagnosis Date Noted  . Rotator cuff  strain 01/30/2021  . Paroxysmal supraventricular tachycardia (HCC) 01/29/2020  . Adult ADHD 03/14/2019  . Severe obstructive sleep apnea 03/14/2019  . Pelvic pain in female 11/24/2017  . Hyperlipidemia, mixed 08/16/2017  . Plantar fasciitis, right 07/07/2017  . Primary osteoarthritis of right knee 11/26/2016  . Obesity 01/30/2016  . Generalized anxiety disorder 12/05/2015  . Treadmill stress test negative for angina pectoris 12/05/2015  . Hypothyroidism 12/05/2015  . Positive ANA (antinuclear antibody) 12/05/2015   Trany Chernick C. Merve Hotard PT, DPT 02/10/21 2:57 PM   [facility] [address] [place], [place], [place] Phone: [phone]   Fax:  [phone]  Name: Ann Greene MRN: Owens Shark Date of Birth: [birth year]

## 2021-02-13 ENCOUNTER — Ambulatory Visit (HOSPITAL_BASED_OUTPATIENT_CLINIC_OR_DEPARTMENT_OTHER): Payer: 59 | Admitting: Physical Therapy

## 2021-02-13 ENCOUNTER — Encounter (HOSPITAL_BASED_OUTPATIENT_CLINIC_OR_DEPARTMENT_OTHER): Payer: Self-pay | Admitting: Physical Therapy

## 2021-02-13 ENCOUNTER — Other Ambulatory Visit: Payer: Self-pay

## 2021-02-13 DIAGNOSIS — G8929 Other chronic pain: Secondary | ICD-10-CM

## 2021-02-13 DIAGNOSIS — M545 Low back pain, unspecified: Secondary | ICD-10-CM | POA: Diagnosis not present

## 2021-02-13 DIAGNOSIS — M25512 Pain in left shoulder: Secondary | ICD-10-CM | POA: Diagnosis not present

## 2021-02-13 DIAGNOSIS — M6281 Muscle weakness (generalized): Secondary | ICD-10-CM

## 2021-02-13 DIAGNOSIS — M25551 Pain in right hip: Secondary | ICD-10-CM | POA: Diagnosis not present

## 2021-02-13 DIAGNOSIS — M25561 Pain in right knee: Secondary | ICD-10-CM | POA: Diagnosis not present

## 2021-02-13 NOTE — Therapy (Signed)
[place], [place], [address] Phone: [phone]   Fax:  [phone]  Physical Therapy Treatment  Patient Details  Name: Ann Greene MRN: [id number] Date of Birth: Oct 21, 1966 Referring Provider (PT): de [place], MD   Encounter Date: 02/13/2021   PT End of Session - 02/13/21 0825    Visit Number 2    Number of Visits 17    Date for PT Re-Evaluation 04/11/21    Authorization Type AETNA   *no IONTO    PT Start Time 0800    PT Stop Time 0852    PT Time Calculation (min) 52 min    Activity Tolerance Patient tolerated treatment well    Behavior During Therapy [facility] for tasks assessed/performed           Past Medical History:  Diagnosis Date  . Thyroid disease     Past Surgical History:  Procedure Laterality Date  . CHOLECYSTECTOMY  1996  . TONSILLECTOMY      There were no vitals filed for this visit.   Subjective Assessment - 02/13/21 0824    Subjective Arrived with little sleep and a tension HA today.    Currently in Pain? Yes    Pain Score 9     Pain Location Back    Pain Orientation Right;Left;Upper;Mid;Lower    Pain Descriptors / Indicators Sore    Pain Radiating Towards shoulders    Aggravating Factors  constant    Pain Relieving Factors heat                             [facility] Adult PT Treatment/Exercise - 02/13/21 0001      Knee/Hip Exercises: Supine   Bridges Limitations hip pain with tband- good with ball    Other Supine Knee/Hip Exercises MET: Lt hip flexors/Rt extensors, adduction, LTR      Modalities   Modalities Moist Heat      Moist Heat Therapy   Number Minutes Moist Heat 10 Minutes    Moist Heat Location Cervical;Lumbar Spine      Manual Therapy   Manual Therapy Soft tissue mobilization    Manual therapy comments skilled palpation and monitoring during TPDN    Soft tissue mobilization bil upper traps, Lt levator scap            Trigger Point Dry  Needling - 02/13/21 0001    Consent Given? Yes    Education Handout Provided --   verbal edu   Muscles Treated Head and Neck Upper trapezius    Upper Trapezius Response Twitch reponse elicited;Palpable increased muscle length   bil               PT Education - 02/13/21 1414    Education Details DN, pelvic rotation, heat v ice    Person(s) Educated Patient    Methods Explanation    Comprehension Verbalized understanding            PT Short Term Goals - 02/10/21 1259      PT SHORT TERM GOAL #1   Title independent in posture corrections    Baseline leg crossing, upright posture on computer    Time 2    Period Weeks    Status New    Target Date 02/28/21             PT Long Term Goals - 02/10/21 1258      PT LONG TERM GOAL #1  Title 5/5 hip strength    Baseline see flowsheet    Time 8    Period Weeks    Status New    Target Date 04/11/21      PT LONG TERM GOAL #2   Title pt will be able to amb for at least 1 hour in prep for trip to [place] 10-15 min at eval    Time 8    Period Weeks    Status New    Target Date 04/11/21      PT LONG TERM GOAL #3   Title able to perform reaching and lifting activities without incr in shoulder pain    Baseline painful at eval    Time 8    Period Weeks    Status New    Target Date 04/11/21      PT LONG TERM GOAL #4   Title independent in long term HEP for strength & stability    Baseline will progress as appropriate    Time 8    Period Weeks    Status New    Target Date 04/11/21                 Plan - 02/13/21 1411    Clinical Impression Statement significant tension in bil upper traps with notable pelvic rotation upon arrival today. tolerated treatment well but did have to break LE MET into 2 exercises due to HS cramping on Rt side.    PT Treatment/Interventions ADLs/Self Care Home Management;Cryotherapy;Ultrasound;Traction;Moist Heat;Electrical Stimulation;Functional mobility  training;Neuromuscular re-education;Balance training;Therapeutic exercise;Therapeutic activities;Patient/family education;Manual techniques;Passive range of motion;Dry needling;Taping;Spinal Manipulations;Joint Manipulations;Iontophoresis 25m/ml Dexamethasone    PT Next Visit Plan DN PRN, recheck pelvic rotation, periscap strength **ask about aquatic apts.    PT Home Exercise Plan [id number]    Consulted and Agree with Plan of Care Patient           Patient will benefit from skilled therapeutic intervention in order to improve the following deficits and impairments:  Difficulty walking,Increased muscle spasms,Improper body mechanics,Pain,Postural dysfunction,Impaired UE functional use,Decreased strength,Decreased activity tolerance  Visit Diagnosis: Chronic pain of right knee  Chronic left shoulder pain  Muscle weakness (generalized)     Problem List Patient Active Problem List   Diagnosis Date Noted  . Rotator cuff strain 01/30/2021  . Paroxysmal supraventricular tachycardia ([facility] 01/29/2020  . Adult ADHD 03/14/2019  . Severe obstructive sleep apnea 03/14/2019  . Pelvic pain in female 11/24/2017  . Hyperlipidemia, mixed 08/16/2017  . Plantar fasciitis, right 07/07/2017  . Primary osteoarthritis of right knee 11/26/2016  . Obesity 01/30/2016  . Generalized anxiety disorder 12/05/2015  . Treadmill stress test negative for angina pectoris 12/05/2015  . Hypothyroidism 12/05/2015  . Positive ANA (antinuclear antibody) 12/05/2015    Ann Greene PT, DPT 02/13/21 2:15 PM   [facility] [address] [place] [address]Phone: [phone]  Fax:  [phone] Name: Ann DownieMRN: [id number] of Birth: [birth year]

## 2021-02-14 DIAGNOSIS — G4733 Obstructive sleep apnea (adult) (pediatric): Secondary | ICD-10-CM | POA: Diagnosis not present

## 2021-02-15 ENCOUNTER — Other Ambulatory Visit: Payer: Self-pay | Admitting: Sports Medicine

## 2021-02-15 DIAGNOSIS — E034 Atrophy of thyroid (acquired): Secondary | ICD-10-CM

## 2021-02-17 ENCOUNTER — Other Ambulatory Visit: Payer: Self-pay | Admitting: Sports Medicine

## 2021-02-17 ENCOUNTER — Ambulatory Visit (HOSPITAL_BASED_OUTPATIENT_CLINIC_OR_DEPARTMENT_OTHER): Payer: 59 | Admitting: Physical Therapy

## 2021-02-17 ENCOUNTER — Other Ambulatory Visit: Payer: Self-pay

## 2021-02-17 ENCOUNTER — Encounter (HOSPITAL_BASED_OUTPATIENT_CLINIC_OR_DEPARTMENT_OTHER): Payer: Self-pay | Admitting: Physical Therapy

## 2021-02-17 DIAGNOSIS — M6281 Muscle weakness (generalized): Secondary | ICD-10-CM | POA: Diagnosis not present

## 2021-02-17 DIAGNOSIS — F411 Generalized anxiety disorder: Secondary | ICD-10-CM

## 2021-02-17 DIAGNOSIS — M25512 Pain in left shoulder: Secondary | ICD-10-CM

## 2021-02-17 DIAGNOSIS — G8929 Other chronic pain: Secondary | ICD-10-CM

## 2021-02-17 DIAGNOSIS — M25551 Pain in right hip: Secondary | ICD-10-CM

## 2021-02-17 DIAGNOSIS — M545 Low back pain, unspecified: Secondary | ICD-10-CM | POA: Diagnosis not present

## 2021-02-17 DIAGNOSIS — M25561 Pain in right knee: Secondary | ICD-10-CM | POA: Diagnosis not present

## 2021-02-17 NOTE — Therapy (Signed)
[facility] [address] [place], [place], [address] Phone: [phone]   Fax:  [phone]  Physical Therapy Treatment  Patient Details  Name: Ann Greene MRN: [id number] Date of Birth: 12-17-1965 Referring Provider (PT): de [place], MD   Encounter Date: 02/17/2021   PT End of Session - 02/17/21 0934    Visit Number 3    Number of Visits 17    Date for PT Re-Evaluation 04/11/21    Authorization Type AETNA   *no IONTO    PT Start Time 0930    PT Stop Time 1010    PT Time Calculation (min) 40 min    Activity Tolerance Patient tolerated treatment well    Behavior During Therapy [facility] for tasks assessed/performed           Past Medical History:  Diagnosis Date  . Thyroid disease     Past Surgical History:  Procedure Laterality Date  . CHOLECYSTECTOMY  1996  . TONSILLECTOMY      There were no vitals filed for this visit.   Subjective Assessment - 02/17/21 0931    Subjective Had some decrease in pain. Got sleep apnea machine 3 days ago so it is making my neck sore trying to figure out how to sleep. going on vacation until 5/1. A little pain here and there in knee but feeling Rt SIJ and hip.    Patient Stated Goals hike in August through [place] a couple of miles              [facility] PT Assessment - 02/17/21 0001      Strength   Right Hip Flexion 4+/5    Right Hip ABduction 5/5    Left Hip Flexion 4+/5    Left Hip ABduction 4+/5                         [facility] Adult PT Treatment/Exercise - 02/17/21 0001      Knee/Hip Exercises: Standing   Other Standing Knee Exercises hip hinge to lift 2-5lb weights      Shoulder Exercises: Supine   Horizontal ABduction Both;10 reps    Theraband Level (Shoulder Horizontal ABduction) Level 2 (Red)    Theraband Level (Shoulder Flexion) Level 2 (Red)    Flexion Limitations flexion + horiz abd tension hold on band      Shoulder Exercises: Seated   External Rotation  Both;15 reps    Theraband Level (Shoulder External Rotation) Level 2 (Red)    External Rotation Limitations cues for scap retraction + core      Shoulder Exercises: Stretch   Other Shoulder Stretches supine pec stretch with deep breathign      Manual Therapy   Manual Therapy Joint mobilization    Manual therapy comments skilled palpation and monitoring during TPDN    Joint Mobilization Rt sacral upper quadrant PA 1 min hold    Soft tissue mobilization Lt upper trap, Rt piriformis            Trigger Point Dry Needling - 02/17/21 0001    Muscles Treated Back/Hip Piriformis    Upper Trapezius Response Twitch reponse elicited;Palpable increased muscle length   Lt   Piriformis Response Twitch response elicited;Palpable increased muscle length   rt                 PT Short Term Goals - 02/17/21 1013      PT SHORT TERM GOAL #1   Title independent  in posture corrections    Baseline aware    Status Achieved             PT Long Term Goals - 02/10/21 1258      PT LONG TERM GOAL #1   Title 5/5 hip strength    Baseline see flowsheet    Time 8    Period Weeks    Status New    Target Date 04/11/21      PT LONG TERM GOAL #2   Title pt will be able to amb for at least 1 hour in prep for trip to [facility]    Baseline 10-15 min at eval    Time 8    Period Weeks    Status New    Target Date 04/11/21      PT LONG TERM GOAL #3   Title able to perform reaching and lifting activities without incr in shoulder pain    Baseline painful at eval    Time 8    Period Weeks    Status New    Target Date 04/11/21      PT LONG TERM GOAL #4   Title independent in long term HEP for strength & stability    Baseline will progress as appropriate    Time 8    Period Weeks    Status New    Target Date 04/11/21                 Plan - 02/17/21 1012    Clinical Impression Statement DN to piriformis on Rt and Lt upper trap  to decrease tension today. Added scap stabilizing wiht  band and reviewed lifting postures as she is leaving for vacation today until 5/1. will re-evaluate and continue as appropriate when she returns.    PT Treatment/Interventions ADLs/Self Care Home Management;Cryotherapy;Ultrasound;Traction;Moist Heat;Electrical Stimulation;Functional mobility training;Neuromuscular re-education;Balance training;Therapeutic exercise;Therapeutic activities;Patient/family education;Manual techniques;Passive range of motion;Dry needling;Taping;Spinal Manipulations;Joint Manipulations;Iontophoresis 4mg /ml Dexamethasone    PT Next Visit Plan re-evaluate    PT Home Exercise Plan [facility]    Consulted and Agree with Plan of Care Patient           Patient will benefit from skilled therapeutic intervention in order to improve the following deficits and impairments:  Difficulty walking,Increased muscle spasms,Improper body mechanics,Pain,Postural dysfunction,Impaired UE functional use,Decreased strength,Decreased activity tolerance  Visit Diagnosis: Chronic pain of right knee  Chronic left shoulder pain  Muscle weakness (generalized)  Chronic bilateral low back pain without sciatica  Pain in right hip     Problem List Patient Active Problem List   Diagnosis Date Noted  . Rotator cuff strain 01/30/2021  . Paroxysmal supraventricular tachycardia (HCC) 01/29/2020  . Adult ADHD 03/14/2019  . Severe obstructive sleep apnea 03/14/2019  . Pelvic pain in female 11/24/2017  . Hyperlipidemia, mixed 08/16/2017  . Plantar fasciitis, right 07/07/2017  . Primary osteoarthritis of right knee 11/26/2016  . Obesity 01/30/2016  . Generalized anxiety disorder 12/05/2015  . Treadmill stress test negative for angina pectoris 12/05/2015  . Hypothyroidism 12/05/2015  . Positive ANA (antinuclear antibody) 12/05/2015    Paz Fuentes C. Vernadine Coombs PT, DPT 02/17/21 10:18 AM   [facility] [address] [place], [place],  [place] Phone: [phone]   Fax:  [phone]  Name: Kynesha Guerin MRN: Owens Shark Date of Birth: 03-17-1966

## 2021-02-19 ENCOUNTER — Ambulatory Visit (HOSPITAL_BASED_OUTPATIENT_CLINIC_OR_DEPARTMENT_OTHER): Payer: 59 | Admitting: Physical Therapy

## 2021-02-23 ENCOUNTER — Other Ambulatory Visit: Payer: Self-pay | Admitting: Sports Medicine

## 2021-02-23 DIAGNOSIS — F411 Generalized anxiety disorder: Secondary | ICD-10-CM

## 2021-03-03 ENCOUNTER — Ambulatory Visit (HOSPITAL_BASED_OUTPATIENT_CLINIC_OR_DEPARTMENT_OTHER): Payer: 59 | Admitting: Physical Therapy

## 2021-03-04 ENCOUNTER — Ambulatory Visit (HOSPITAL_BASED_OUTPATIENT_CLINIC_OR_DEPARTMENT_OTHER): Payer: BLUE CROSS/BLUE SHIELD | Admitting: Family Medicine

## 2021-03-05 ENCOUNTER — Ambulatory Visit (HOSPITAL_BASED_OUTPATIENT_CLINIC_OR_DEPARTMENT_OTHER): Payer: 59 | Admitting: Physical Therapy

## 2021-03-10 ENCOUNTER — Ambulatory Visit (HOSPITAL_BASED_OUTPATIENT_CLINIC_OR_DEPARTMENT_OTHER): Payer: 59 | Admitting: Physical Therapy

## 2021-03-11 ENCOUNTER — Encounter (HOSPITAL_BASED_OUTPATIENT_CLINIC_OR_DEPARTMENT_OTHER): Payer: Self-pay | Admitting: Family Medicine

## 2021-03-12 ENCOUNTER — Encounter (HOSPITAL_BASED_OUTPATIENT_CLINIC_OR_DEPARTMENT_OTHER): Payer: BLUE CROSS/BLUE SHIELD | Admitting: Physical Therapy

## 2021-03-13 ENCOUNTER — Ambulatory Visit (HOSPITAL_BASED_OUTPATIENT_CLINIC_OR_DEPARTMENT_OTHER): Payer: 59 | Admitting: Physical Therapy

## 2021-03-16 DIAGNOSIS — G4733 Obstructive sleep apnea (adult) (pediatric): Secondary | ICD-10-CM | POA: Diagnosis not present

## 2021-03-17 ENCOUNTER — Other Ambulatory Visit: Payer: Self-pay

## 2021-03-17 ENCOUNTER — Ambulatory Visit (HOSPITAL_BASED_OUTPATIENT_CLINIC_OR_DEPARTMENT_OTHER): Payer: 59 | Attending: Family Medicine | Admitting: Physical Therapy

## 2021-03-17 ENCOUNTER — Encounter (HOSPITAL_BASED_OUTPATIENT_CLINIC_OR_DEPARTMENT_OTHER): Payer: Self-pay | Admitting: Physical Therapy

## 2021-03-17 DIAGNOSIS — M25661 Stiffness of right knee, not elsewhere classified: Secondary | ICD-10-CM | POA: Insufficient documentation

## 2021-03-17 DIAGNOSIS — M6281 Muscle weakness (generalized): Secondary | ICD-10-CM | POA: Insufficient documentation

## 2021-03-17 DIAGNOSIS — M545 Low back pain, unspecified: Secondary | ICD-10-CM | POA: Insufficient documentation

## 2021-03-17 DIAGNOSIS — M25551 Pain in right hip: Secondary | ICD-10-CM | POA: Insufficient documentation

## 2021-03-17 DIAGNOSIS — G8929 Other chronic pain: Secondary | ICD-10-CM | POA: Diagnosis not present

## 2021-03-17 DIAGNOSIS — M25561 Pain in right knee: Secondary | ICD-10-CM | POA: Diagnosis not present

## 2021-03-17 DIAGNOSIS — M25512 Pain in left shoulder: Secondary | ICD-10-CM | POA: Insufficient documentation

## 2021-03-17 DIAGNOSIS — R262 Difficulty in walking, not elsewhere classified: Secondary | ICD-10-CM | POA: Diagnosis not present

## 2021-03-17 NOTE — Therapy (Signed)
[facility] [address] [place], [place], [address] Phone: [phone]   Fax:  [phone]  Physical Therapy Treatment  Patient Details  Name: Ann Greene MRN: [id number] Date of Birth: 03-18-1966 Referring Provider (PT): de Peru, MD   Encounter Date: 03/17/2021   PT End of Session - 03/17/21 0851    Visit Number 4    Number of Visits 17    Date for PT Re-Evaluation 04/11/21    Authorization Type AETNA   *no IONTO    PT Start Time 0847    PT Stop Time 0929    PT Time Calculation (min) 42 min    Activity Tolerance Patient tolerated treatment well    Behavior During Therapy [facility] for tasks assessed/performed           Past Medical History:  Diagnosis Date  . Thyroid disease     Past Surgical History:  Procedure Laterality Date  . CHOLECYSTECTOMY  1996  . TONSILLECTOMY      There were no vitals filed for this visit.   Subjective Assessment - 03/17/21 0849    Subjective some rough days and some good days. did a lot of steps one day which took its toll but iced it and it was better. back is feeling better. had a massage on vacation. mild ache in lateral left shoulder but no major pain.    Patient Stated Goals hike in August through [place] a couple of miles    Currently in Pain? Yes    Pain Score 6     Pain Location Knee    Pain Orientation Right;Medial    Aggravating Factors  steps    Pain Relieving Factors ice              [facility] PT Assessment - 03/17/21 0001      ROM / Strength   AROM / PROM / Strength Strength      Strength   Overall Strength Comments Rt shoulder ER4/5 with discomfort    Strength Assessment Site Shoulder    Right/Left Shoulder Left    Left Shoulder Flexion 4/5    Left Shoulder ABduction 4/5    Left Shoulder Internal Rotation 5/5    Left Shoulder External Rotation 4+/5    Right Hip Flexion 4+/5    Right Hip ABduction 5/5    Left Hip Flexion 5/5    Left Hip ABduction 5/5       Palpation   Palpation comment TTP bil SIJ, neutral rotation                         [facility] Adult PT Treatment/Exercise - 03/17/21 0001      Shoulder Exercises: Stretch   Other Shoulder Stretches cervical rotation snag      Manual Therapy   Joint Mobilization Lt upper sacral PA 2 min hold    Soft tissue mobilization Lt upper trap, IASTM Lt deltoid- edu in use of theracane                  PT Education - 03/17/21 0941    Education Details nerve pattern v bursitis, updates on strength/posture, travel    Person(s) Educated Patient    Methods Explanation    Comprehension Verbalized understanding;Need further instruction            PT Short Term Goals - 02/17/21 1013      PT SHORT TERM GOAL #1   Title independent in posture  corrections    Baseline aware    Status Achieved             PT Long Term Goals - 02/10/21 1258      PT LONG TERM GOAL #1   Title 5/5 hip strength    Baseline see flowsheet    Time 8    Period Weeks    Status New    Target Date 04/11/21      PT LONG TERM GOAL #2   Title pt will be able to amb for at least 1 hour in prep for trip to [facility]    Baseline 10-15 min at eval    Time 8    Period Weeks    Status New    Target Date 04/11/21      PT LONG TERM GOAL #3   Title able to perform reaching and lifting activities without incr in shoulder pain    Baseline painful at eval    Time 8    Period Weeks    Status New    Target Date 04/11/21      PT LONG TERM GOAL #4   Title independent in long term HEP for strength & stability    Baseline will progress as appropriate    Time 8    Period Weeks    Status New    Target Date 04/11/21                 Plan - 03/17/21 0929    Clinical Impression Statement Is making progress in overall strength and postural alignment. reminder to depress shoulder in cervical SNAG reduced pain levels. Significant tension in Lt upper trap today- has been sick and coughing. burning in  lateral Lt thigh along ITB and will perform palpation to bursa next time she feels burning.    PT Treatment/Interventions ADLs/Self Care Home Management;Cryotherapy;Ultrasound;Traction;Moist Heat;Electrical Stimulation;Functional mobility training;Neuromuscular re-education;Balance training;Therapeutic exercise;Therapeutic activities;Patient/family education;Manual techniques;Passive range of motion;Dry needling;Taping;Spinal Manipulations;Joint Manipulations;Iontophoresis 4mg /ml Dexamethasone    PT Next Visit Plan burning in leg? in pool- core, hip and LE strength    PT Home Exercise Plan [id number]    Consulted and Agree with Plan of Care Patient           Patient will benefit from skilled therapeutic intervention in order to improve the following deficits and impairments:  Difficulty walking,Increased muscle spasms,Improper body mechanics,Pain,Postural dysfunction,Impaired UE functional use,Decreased strength,Decreased activity tolerance  Visit Diagnosis: Chronic pain of right knee  Chronic left shoulder pain  Muscle weakness (generalized)  Chronic bilateral low back pain without sciatica     Problem List Patient Active Problem List   Diagnosis Date Noted  . Rotator cuff strain 01/30/2021  . Paroxysmal supraventricular tachycardia (HCC) 01/29/2020  . Adult ADHD 03/14/2019  . Severe obstructive sleep apnea 03/14/2019  . Pelvic pain in female 11/24/2017  . Hyperlipidemia, mixed 08/16/2017  . Plantar fasciitis, right 07/07/2017  . Primary osteoarthritis of right knee 11/26/2016  . Obesity 01/30/2016  . Generalized anxiety disorder 12/05/2015  . Treadmill stress test negative for angina pectoris 12/05/2015  . Hypothyroidism 12/05/2015  . Positive ANA (antinuclear antibody) 12/05/2015    Ann Greene C. Ann Greene PT, DPT 03/17/21 9:42 AM   [facility] [address] [place], [place], [place] Phone: [phone]   Fax:   [phone]  Name: Ann Greene MRN: Owens Shark Date of Birth: 1966-10-18

## 2021-03-19 ENCOUNTER — Encounter (HOSPITAL_BASED_OUTPATIENT_CLINIC_OR_DEPARTMENT_OTHER): Payer: BLUE CROSS/BLUE SHIELD | Admitting: Physical Therapy

## 2021-03-20 ENCOUNTER — Encounter (HOSPITAL_BASED_OUTPATIENT_CLINIC_OR_DEPARTMENT_OTHER): Payer: Self-pay | Admitting: Physical Therapy

## 2021-03-20 ENCOUNTER — Ambulatory Visit (HOSPITAL_BASED_OUTPATIENT_CLINIC_OR_DEPARTMENT_OTHER): Payer: 59 | Admitting: Physical Therapy

## 2021-03-20 ENCOUNTER — Other Ambulatory Visit: Payer: Self-pay

## 2021-03-20 DIAGNOSIS — M25551 Pain in right hip: Secondary | ICD-10-CM | POA: Diagnosis not present

## 2021-03-20 DIAGNOSIS — M25512 Pain in left shoulder: Secondary | ICD-10-CM | POA: Diagnosis not present

## 2021-03-20 DIAGNOSIS — G8929 Other chronic pain: Secondary | ICD-10-CM

## 2021-03-20 DIAGNOSIS — R262 Difficulty in walking, not elsewhere classified: Secondary | ICD-10-CM | POA: Diagnosis not present

## 2021-03-20 DIAGNOSIS — M25561 Pain in right knee: Secondary | ICD-10-CM | POA: Diagnosis not present

## 2021-03-20 DIAGNOSIS — M25661 Stiffness of right knee, not elsewhere classified: Secondary | ICD-10-CM

## 2021-03-20 DIAGNOSIS — M6281 Muscle weakness (generalized): Secondary | ICD-10-CM

## 2021-03-20 DIAGNOSIS — M545 Low back pain, unspecified: Secondary | ICD-10-CM | POA: Diagnosis not present

## 2021-03-20 DIAGNOSIS — G4733 Obstructive sleep apnea (adult) (pediatric): Secondary | ICD-10-CM | POA: Diagnosis not present

## 2021-03-20 NOTE — Therapy (Signed)
[facility] [address] [place], [place], [address] Phone: [phone]   Fax:  [phone]  Physical Therapy Treatment  Patient Details  Name: Ann Greene MRN: [id number] Date of Birth: September 09, 1966 Referring Provider (PT): de Peru, MD   Encounter Date: 03/20/2021   PT End of Session - 03/20/21 1333    Visit Number 5    Number of Visits 17    Date for PT Re-Evaluation 04/11/21    Authorization Type AETNA   *no IONTO    PT Start Time 1117    PT Stop Time 1205    PT Time Calculation (min) 48 min    Equipment Utilized During Treatment Other (comment)   buoyancy belt, cervical collar, foam ankle cuffs, sqoodles/noodles, kick board.   Activity Tolerance Patient tolerated treatment well    Behavior During Therapy [facility] for tasks assessed/performed           Past Medical History:  Diagnosis Date  . Thyroid disease     Past Surgical History:  Procedure Laterality Date  . CHOLECYSTECTOMY  1996  . TONSILLECTOMY      There were no vitals filed for this visit.   Subjective Assessment - 03/20/21 1332    Subjective am concerned about my knee and being able to tolerate and enjoy my vacations scheduled later in summer. My shoulder is definitely feeling better          TREATMENT Pt seen for aquatic therapy today.  Treatment took place in water 3.25-4.8 ft in depth at the [facility] pool. Temp of water was 91.  Pt entered/exited the pool via stairs step through pattern independently with bilat rail. Warm up: forward, backward and side stepping/walking cues for increased step length, increased speed, hand placement to increase resistance. Stretching: Wall stretching gastroc, hamstring, LB  Strengthening CORE: planks, kick board push downs, cuing for core stabilization control-- LE's: HIP flex and ext, add/abd, circles, scissoring-- KNEE flex/ext resisted by Noodles and foam ankle cuffs, varying speed.  Prone kicking at hip  then at knee Balance static and dynamic: planks static and walking, knee lift with manual perturbations Gentle ROM and strengtheing of right shld: using barbells and noodles adduction/abd, flex/ ext both to 90 degrees, horizontal abd throughout range Pt requires buoyancy for support and to offload joints with strengthening exercises. Viscosity of the water is needed for resistance of strengthening; water current perturbations provides challenge to standing balance unsupported, requiring increased core activation.                             PT Short Term Goals - 02/17/21 1013      PT SHORT TERM GOAL #1   Title independent in posture corrections    Baseline aware    Status Achieved             PT Long Term Goals - 02/10/21 1258      PT LONG TERM GOAL #1   Title 5/5 hip strength    Baseline see flowsheet    Time 8    Period Weeks    Status New    Target Date 04/11/21      PT LONG TERM GOAL #2   Title pt will be able to amb for at least 1 hour in prep for trip to [facility]    Baseline 10-15 min at eval    Time 8    Period Weeks    Status New  Target Date 04/11/21      PT LONG TERM GOAL #3   Title able to perform reaching and lifting activities without incr in shoulder pain    Baseline painful at eval    Time 8    Period Weeks    Status New    Target Date 04/11/21      PT LONG TERM GOAL #4   Title independent in long term HEP for strength & stability    Baseline will progress as appropriate    Time 8    Period Weeks    Status New    Target Date 04/11/21                 Plan - 03/20/21 1911    Clinical Impression Statement No c/o burning in lat thigh.  Pt tolerates extensive exerise and ROM/stretching in aquatic setting of right knee/LE, core.  C/o some medial joint discomfort with hip rotation. Worked on left shld flex and abd to 90 degrees, horizontal abd/add and planks using kick board, barbells and noodle. Very comfortable in pool     Personal Factors and Comorbidities Time since onset of injury/illness/exacerbation;Fitness;Profession    Examination-Activity Limitations Squat;Lift;Stairs;Bend;Stand;Locomotion Level;Caring for Others;Reach Overhead;Carry;Sit    Examination-Participation Restrictions Cleaning;Community Activity;Occupation;Other    Stability/Clinical Decision Making Evolving/Moderate complexity    Clinical Decision Making Moderate    Rehab Potential Good    PT Treatment/Interventions ADLs/Self Care Home Management;Cryotherapy;Ultrasound;Traction;Moist Heat;Electrical Stimulation;Functional mobility training;Neuromuscular re-education;Balance training;Therapeutic exercise;Therapeutic activities;Patient/family education;Manual techniques;Passive range of motion;Dry needling;Taping;Spinal Manipulations;Joint Manipulations;Iontophoresis 4mg /ml Dexamethasone    PT Next Visit Plan right med knee discomfort?    PT Home Exercise Plan [id number]    Consulted and Agree with Plan of Care Patient           Patient will benefit from skilled therapeutic intervention in order to improve the following deficits and impairments:  Difficulty walking,Increased muscle spasms,Improper body mechanics,Pain,Postural dysfunction,Impaired UE functional use,Decreased strength,Decreased activity tolerance  Visit Diagnosis: Chronic pain of right knee  Chronic left shoulder pain  Muscle weakness (generalized)  Pain in right hip  Stiffness of right knee, not elsewhere classified  Difficulty in walking, not elsewhere classified  Chronic midline low back pain without sciatica  Chronic bilateral low back pain without sciatica     Problem List Patient Active Problem List   Diagnosis Date Noted  . Rotator cuff strain 01/30/2021  . Paroxysmal supraventricular tachycardia (HCC) 01/29/2020  . Adult ADHD 03/14/2019  . Severe obstructive sleep apnea 03/14/2019  . Pelvic pain in female 11/24/2017  . Hyperlipidemia, mixed  08/16/2017  . Plantar fasciitis, right 07/07/2017  . Primary osteoarthritis of right knee 11/26/2016  . Obesity 01/30/2016  . Generalized anxiety disorder 12/05/2015  . Treadmill stress test negative for angina pectoris 12/05/2015  . Hypothyroidism 12/05/2015  . Positive ANA (antinuclear antibody) 12/05/2015    02/02/2016 03/20/2021, 7:24 PM  [facility] [address] [place], [place], [place] Phone: [phone]   Fax:  [phone]  Name: Celines Femia MRN: Owens Shark Date of Birth: 1966/08/17

## 2021-03-21 ENCOUNTER — Ambulatory Visit (INDEPENDENT_AMBULATORY_CARE_PROVIDER_SITE_OTHER): Payer: BLUE CROSS/BLUE SHIELD | Admitting: Internal Medicine

## 2021-03-21 ENCOUNTER — Encounter: Payer: Self-pay | Admitting: Internal Medicine

## 2021-03-21 VITALS — BP 148/90 | HR 72 | Ht 69.0 in | Wt 311.4 lb

## 2021-03-21 DIAGNOSIS — R002 Palpitations: Secondary | ICD-10-CM

## 2021-03-21 DIAGNOSIS — E782 Mixed hyperlipidemia: Secondary | ICD-10-CM

## 2021-03-21 DIAGNOSIS — R0683 Snoring: Secondary | ICD-10-CM

## 2021-03-21 DIAGNOSIS — I471 Supraventricular tachycardia, unspecified: Secondary | ICD-10-CM

## 2021-03-21 NOTE — Progress Notes (Signed)
Cardiology Office Note:    Date:  03/21/2021   ID:  Ann Greene, DOB [birth year], MRN [id number]  PCP:  Pcp, No  Cardiologist:  None  Electrophysiologist:  None   Referring MD: No ref. provider found   Chief Complaint/Reason for Referral: Follow-up palpitations and daytime fatigue   History of Present Illness:    Ann Greene is a 55 y.o. female with a history of anxiety and panic disorder, hypothyroidism with a history of Hashimoto's disease, positive ANA, osteoarthritis of the right knee, hyperlipidemia, adult ADHD previously on Vyvanse, and excessive daytime sleepiness who presents for follow-up of palpitations noted to be symptomatic SVT on cardiac monitor.   03/21/2021: Since her last visit, she has not had any more palpitations that she needed to take medication to resolve. At this visit she has no new complaints.  She began using her CPAP one month ago, and it is helping her. Typically she is waking up feeling well-rested. Occasionally she will not wake up rested, particularly if she eats dinner too late at night.  She denies any chest pain, shortness of breath, or exertional symptoms. No headaches, lightheadedness, or syncope to report. Also has no lower extremity edema, orthopnea or PND.  Lately she has changed her diet to be more pescatarian rather than vegan. She notes she needs to be more mindful about carbs.  In 2021/01/02 her mother passed away and she is still in the process of returning to her diet and exercise routines. From 10/2020 she was taking care of her mother during her decline. She is well supported by her husband and son.  She is claustrophobic but willing to consider a coronary calcium CT for further risk stratification of mild hyperlipidemia.  Past Medical History:  Diagnosis Date  . Thyroid disease     Past Surgical History:  Procedure Laterality Date  . CHOLECYSTECTOMY  1996  . TONSILLECTOMY      Current Medications: Current Meds  Medication Sig  .  DULoxetine (CYMBALTA) 60 MG capsule TAKE 1 CAPSULE (60 MG TOTAL) BY MOUTH DAILY. DUE FOR FOLLOW UP.  . levothyroxine (SYNTHROID) 150 MCG tablet TAKE 1 TABLET BY MOUTH EVERY DAY BEFORE BREAKFAST  . metoprolol tartrate (LOPRESSOR) 25 MG tablet Take 1 tablet (25 mg total) by mouth 2 (two) times daily as needed. MAY TAKE TWICE DAILY PRN (AS NEEDED) FOR PALPITATIONS     Allergies:   Patient has no known allergies.   Social History   Tobacco Use  . Smoking status: Never Smoker  . Smokeless tobacco: Never Used  Substance Use Topics  . Alcohol use: Yes    Alcohol/week: 0.0 standard drinks    Comment: occasional  . Drug use: No     Family History: The patient's family history includes Alcohol abuse in her brother, father, and paternal uncle; Cancer in her brother and paternal uncle; Heart attack in her father and maternal grandmother; Suicidality in her brother.  ROS:   Please see the history of present illness.    All other systems reviewed and are negative.  EKGs/Labs/Other Studies Reviewed:    The following studies were reviewed today:  Echo 03/11/2020: 1. Left ventricular ejection fraction, by estimation, is 60 to 65%. The  left ventricle has normal function. The left ventricle has no regional  wall motion abnormalities. There is moderate concentric left ventricular  hypertrophy. Left ventricular  diastolic parameters were normal.  2. Right ventricular systolic function is normal. The right ventricular  size is normal. There is normal pulmonary  artery systolic pressure. The  estimated right ventricular systolic pressure is 24.7 mmHg.  3. The mitral valve is normal in structure. No evidence of mitral valve  regurgitation. No evidence of mitral stenosis.  4. The aortic valve is normal in structure. Aortic valve regurgitation is  not visualized. No aortic stenosis is present.  5. The inferior vena cava is normal in size with greater than 50%  respiratory variability, suggesting  right atrial pressure of 3 mmHg.   EKG:   03/21/2021: NSR, rate 72, nonspecific T wave abnormality 09/16/2020: NSR, 81 bpm.   Recent Labs: No results found for requested labs within last 8760 hours.  Recent Lipid Panel    Component Value Date/Time   CHOL 199 09/16/2020 0912   TRIG 106 09/16/2020 0912   HDL 51 09/16/2020 0912   CHOLHDL 3.9 09/16/2020 0912   CHOLHDL 4.0 03/16/2019 0842   VLDL 17 12/19/2015 0810   LDLCALC 129 (H) 09/16/2020 0912   LDLCALC 133 (H) 03/16/2019 0842    Physical Exam:    VS:  BP (!) 148/90   Pulse 72   Ht 5\' 9"  (1.753 m)   Wt (!) 311 lb 6.4 oz (141.3 kg)   SpO2 97%   BMI 45.99 kg/m     Wt Readings from Last 5 Encounters:  03/21/21 (!) 311 lb 6.4 oz (141.3 kg)  01/30/21 (!) 314 lb 6.4 oz (142.6 kg)  10/23/20 (!) 315 lb (142.9 kg)  09/16/20 (!) 316 lb 9.6 oz (143.6 kg)  07/18/20 (!) 305 lb (138.3 kg)    Constitutional: No acute distress Eyes: sclera non-icteric, normal conjunctiva and lids ENMT: normal dentition, moist mucous membranes Cardiovascular: regular rhythm, normal rate, no murmurs. S1 and S2 normal. Radial pulses normal bilaterally. No jugular venous distention.  Respiratory: clear to auscultation bilaterally GI : normal bowel sounds, soft and nontender. No distention.   MSK: extremities warm, well perfused. No edema.  NEURO: grossly nonfocal exam, moves all extremities. PSYCH: alert and oriented x 3, normal mood and affect.   ASSESSMENT:    1. Hyperlipidemia, mixed   2. SVT (supraventricular tachycardia) (HCC)   3. Palpitations   4. Snoring    PLAN:    SVT (supraventricular tachycardia) (HCC) - Plan: EKG 12-Lead Palpitations -Symptoms have improved over time and with treatment of OSA.  Continue to use metoprolol 25 mg as needed palpitations.  She has not required any recently.  Hyperlipidemia, mixed - Plan: Lipid panel - last lipid panel not optimized, LDL 129, relatively unchanged from prior. Will recheck lipids and  consider medication therapy if no change. ASCVD risk score is 2% in 10 years so we can discuss further lifestyle modification vs medication therapy per patient preference. Can also consider calcium scoring. She has made dietary changes independently, follows a vegan diet.  With recent loss of her mother, she has not been able to follow her diet as closely, and therefore we will delay next lipid check for 6 months.  She may call in to schedule a coronary CT calcium score in the meantime if she would like.  Daytime somnolence Snoring - severe OSA, now on CPAP nightly.   CV risk reduction - reviewed diet and lifestyle today. Mediterranean diet recommended, or plant based diet.   Total time of encounter: 30 minutes total time of encounter, including 20 minutes spent in face-to-face patient care on the date of this encounter. This time includes coordination of care and counseling regarding above mentioned problem list. Remainder of non-face-to-face time  involved reviewing chart documents/testing relevant to the patient encounter and documentation in the medical record. I have independently reviewed documentation from referring provider.   Weston Brass, MD [place]  [facility]    Medication Adjustments/Labs and Tests Ordered: Current medicines are reviewed at length with the patient today.  Concerns regarding medicines are outlined above.   Orders Placed This Encounter  Procedures  . Lipid panel  . EKG 12-Lead    No orders of the defined types were placed in this encounter.   Patient Instructions  Medication Instructions:  No Changes In Medications at this time.  *If you need a refill on your cardiac medications before your next appointment, please call your pharmacy*  Lab Work: FASTING LIPIDS 6 MONTHS RIGHT BEFORE NEXT APPOINTMENT  If you have labs (blood work) drawn today and your tests are completely normal, you will receive your results only by: Marland Kitchen MyChart Message (if you  have MyChart) OR . A paper copy in the mail If you have any lab test that is abnormal or we need to change your treatment, we will call you to review the results.  Follow-Up: At [facility], you and your health needs are our priority.  As part of our continuing mission to provide you with exceptional heart care, we have created designated Provider Care Teams.  These Care Teams include your primary Cardiologist (physician) and Advanced Practice Providers (APPs -  Physician Assistants and Nurse Practitioners) who all work together to provide you with the care you need, when you need it.  We recommend signing up for the patient portal called "MyChart".  Sign up information is provided on this After Visit Summary.  MyChart is used to connect with patients for Virtual Visits (Telemedicine).  Patients are able to view lab/test results, encounter notes, upcoming appointments, etc.  Non-urgent messages can be sent to your provider as well.   To learn more about what you can do with MyChart, go to [email or web address].    Your next appointment:   6 month(s)  The format for your next appointment:   In Person  Provider:   Weston Brass, MD  Other Instructions PLEASE CALL us WHEN READY TO SCHEDULE CALCIUM SCORE       I,Mathew Stumpf,acting as a scribe for [name], MD.,have documented all relevant documentation on the behalf of [name], MD,as directed by  [name], MD while in the presence of [name], MD.  I, [name], MD, have reviewed all documentation for this visit. The documentation on 03/21/21 for the exam, diagnosis, procedures, and orders are all accurate and complete.

## 2021-03-21 NOTE — Patient Instructions (Signed)
Medication Instructions:  No Changes In Medications at this time.  *If you need a refill on your cardiac medications before your next appointment, please call your pharmacy*  Lab Work: FASTING LIPIDS 6 MONTHS RIGHT BEFORE NEXT APPOINTMENT  If you have labs (blood work) drawn today and your tests are completely normal, you will receive your results only by: Marland Kitchen MyChart Message (if you have MyChart) OR . A paper copy in the mail If you have any lab test that is abnormal or we need to change your treatment, we will call you to review the results.  Follow-Up: At [facility], you and your health needs are our priority.  As part of our continuing mission to provide you with exceptional heart care, we have created designated Provider Care Teams.  These Care Teams include your primary Cardiologist (physician) and Advanced Practice Providers (APPs -  Physician Assistants and Nurse Practitioners) who all work together to provide you with the care you need, when you need it.  We recommend signing up for the patient portal called "MyChart".  Sign up information is provided on this After Visit Summary.  MyChart is used to connect with patients for Virtual Visits (Telemedicine).  Patients are able to view lab/test results, encounter notes, upcoming appointments, etc.  Non-urgent messages can be sent to your provider as well.   To learn more about what you can do with MyChart, go to [email or web address].    Your next appointment:   6 month(s)  The format for your next appointment:   In Person  Provider:   Weston Brass, MD  Other Instructions PLEASE CALL us WHEN READY TO SCHEDULE CALCIUM SCORE

## 2021-03-24 ENCOUNTER — Encounter (HOSPITAL_BASED_OUTPATIENT_CLINIC_OR_DEPARTMENT_OTHER): Payer: Self-pay | Admitting: Physical Therapy

## 2021-03-24 ENCOUNTER — Ambulatory Visit (HOSPITAL_BASED_OUTPATIENT_CLINIC_OR_DEPARTMENT_OTHER): Payer: 59 | Admitting: Physical Therapy

## 2021-03-24 ENCOUNTER — Other Ambulatory Visit: Payer: Self-pay

## 2021-03-24 DIAGNOSIS — G8929 Other chronic pain: Secondary | ICD-10-CM

## 2021-03-24 DIAGNOSIS — R262 Difficulty in walking, not elsewhere classified: Secondary | ICD-10-CM | POA: Diagnosis not present

## 2021-03-24 DIAGNOSIS — M25561 Pain in right knee: Secondary | ICD-10-CM

## 2021-03-24 DIAGNOSIS — M25512 Pain in left shoulder: Secondary | ICD-10-CM

## 2021-03-24 DIAGNOSIS — M25661 Stiffness of right knee, not elsewhere classified: Secondary | ICD-10-CM | POA: Diagnosis not present

## 2021-03-24 DIAGNOSIS — M545 Low back pain, unspecified: Secondary | ICD-10-CM | POA: Diagnosis not present

## 2021-03-24 DIAGNOSIS — M6281 Muscle weakness (generalized): Secondary | ICD-10-CM | POA: Diagnosis not present

## 2021-03-24 DIAGNOSIS — M25551 Pain in right hip: Secondary | ICD-10-CM | POA: Diagnosis not present

## 2021-03-24 NOTE — Therapy (Signed)
[facility] [address] [place], [place], [address] Phone: [phone]   Fax:  [phone]  Physical Therapy Treatment  Patient Details  Name: Ann Greene MRN: [id number] Date of Birth: 1966/07/29 Referring Provider (PT): de Peru, MD   Encounter Date: 03/24/2021   PT End of Session - 03/24/21 0851    Visit Number 6    Number of Visits 17    Date for PT Re-Evaluation 04/11/21    Authorization Type AETNA   *no IONTO    PT Start Time 0845    PT Stop Time 0928    PT Time Calculation (min) 43 min    Activity Tolerance Patient tolerated treatment well    Behavior During Therapy [facility] for tasks assessed/performed           Past Medical History:  Diagnosis Date  . Thyroid disease     Past Surgical History:  Procedure Laterality Date  . CHOLECYSTECTOMY  1996  . TONSILLECTOMY      There were no vitals filed for this visit.   Subjective Assessment - 03/24/21 0848    Subjective A little sore after aquatics and I can feel my knee. Started having burning in Lt hip and thigh while standing during shopping.    Patient Stated Goals hike in August through [place] a couple of miles    Currently in Pain? Yes    Pain Score 5     Pain Location Knee    Pain Orientation Right;Medial    Pain Descriptors / Indicators Sore    Aggravating Factors  standing    Pain Relieving Factors rest                             [facility] Adult PT Treatment/Exercise - 03/24/21 0001      Knee/Hip Exercises: Stretches   Passive Hamstring Stretch Limitations seated EOB    Piriformis Stretch Limitations seated      Knee/Hip Exercises: Aerobic   Nustep 5 min L4 LE only, concurrent with IASTM to deltoid      Knee/Hip Exercises: Supine   Other Supine Knee/Hip Exercises pelvic tilt +march, + SLR      Shoulder Exercises: Stretch   Other Shoulder Stretches upper trap & levator      Manual Therapy   Manual therapy comments skilled  palpation and monitoring during TPDN    Soft tissue mobilization roller Lt HS/VL/ITB, STM TFL; IASTM deltoid & upper trap            Trigger Point Dry Needling - 03/24/21 0001    Muscles Treated Back/Hip Tensor fascia lata    Tensor Fascia Lata Response Twitch response elicited;Palpable increased muscle length   LEFT               PT Education - 03/24/21 0929    Education Details see plan    Person(s) Educated Patient    Methods Explanation    Comprehension Verbalized understanding;Need further instruction            PT Short Term Goals - 02/17/21 1013      PT SHORT TERM GOAL #1   Title independent in posture corrections    Baseline aware    Status Achieved             PT Long Term Goals - 02/10/21 1258      PT LONG TERM GOAL #1   Title 5/5 hip strength  Baseline see flowsheet    Time 8    Period Weeks    Status New    Target Date 04/11/21      PT LONG TERM GOAL #2   Title pt will be able to amb for at least 1 hour in prep for trip to [facility]    Baseline 10-15 min at eval    Time 8    Period Weeks    Status New    Target Date 04/11/21      PT LONG TERM GOAL #3   Title able to perform reaching and lifting activities without incr in shoulder pain    Baseline painful at eval    Time 8    Period Weeks    Status New    Target Date 04/11/21      PT LONG TERM GOAL #4   Title independent in long term HEP for strength & stability    Baseline will progress as appropriate    Time 8    Period Weeks    Status New    Target Date 04/11/21                 Plan - 03/24/21 5003    Clinical Impression Statement Much of apt today spent on manual therapy as she was very tight and having burning in Lt ITB. Discussed benefit of active recovery exercises to avoid excessive soreness following aquatic exercise.    PT Treatment/Interventions ADLs/Self Care Home Management;Cryotherapy;Ultrasound;Traction;Moist Heat;Electrical Stimulation;Functional mobility  training;Neuromuscular re-education;Balance training;Therapeutic exercise;Therapeutic activities;Patient/family education;Manual techniques;Passive range of motion;Dry needling;Taping;Spinal Manipulations;Joint Manipulations;Iontophoresis 4mg /ml Dexamethasone    PT Next Visit Plan aquatic- post chain activation    PT Home Exercise Plan [id number]    Consulted and Agree with Plan of Care Patient           Patient will benefit from skilled therapeutic intervention in order to improve the following deficits and impairments:  Difficulty walking,Increased muscle spasms,Improper body mechanics,Pain,Postural dysfunction,Impaired UE functional use,Decreased strength,Decreased activity tolerance  Visit Diagnosis: Chronic pain of right knee  Chronic left shoulder pain  Muscle weakness (generalized)     Problem List Patient Active Problem List   Diagnosis Date Noted  . Rotator cuff strain 01/30/2021  . Paroxysmal supraventricular tachycardia (HCC) 01/29/2020  . Adult ADHD 03/14/2019  . Severe obstructive sleep apnea 03/14/2019  . Pelvic pain in female 11/24/2017  . Hyperlipidemia, mixed 08/16/2017  . Plantar fasciitis, right 07/07/2017  . Primary osteoarthritis of right knee 11/26/2016  . Obesity 01/30/2016  . Generalized anxiety disorder 12/05/2015  . Treadmill stress test negative for angina pectoris 12/05/2015  . Hypothyroidism 12/05/2015  . Positive ANA (antinuclear antibody) 12/05/2015    Alyxandra Tenbrink C. Keanna Tugwell PT, DPT 03/24/21 11:14 AM   [facility] [address] [place], [place], [place] Phone: [phone]   Fax:  [phone]  Name: Trica Usery MRN: Owens Shark Date of Birth: 12/08/1965

## 2021-03-26 ENCOUNTER — Encounter (HOSPITAL_BASED_OUTPATIENT_CLINIC_OR_DEPARTMENT_OTHER): Payer: BLUE CROSS/BLUE SHIELD | Admitting: Physical Therapy

## 2021-03-27 ENCOUNTER — Encounter: Payer: Self-pay | Admitting: Cardiology

## 2021-03-27 ENCOUNTER — Ambulatory Visit (HOSPITAL_BASED_OUTPATIENT_CLINIC_OR_DEPARTMENT_OTHER): Payer: 59 | Admitting: Physical Therapy

## 2021-03-27 ENCOUNTER — Telehealth (INDEPENDENT_AMBULATORY_CARE_PROVIDER_SITE_OTHER): Payer: BLUE CROSS/BLUE SHIELD | Admitting: Cardiology

## 2021-03-27 ENCOUNTER — Other Ambulatory Visit: Payer: Self-pay

## 2021-03-27 VITALS — Ht 69.0 in | Wt 311.0 lb

## 2021-03-27 DIAGNOSIS — M545 Low back pain, unspecified: Secondary | ICD-10-CM

## 2021-03-27 DIAGNOSIS — R0683 Snoring: Secondary | ICD-10-CM | POA: Diagnosis not present

## 2021-03-27 DIAGNOSIS — I471 Supraventricular tachycardia: Secondary | ICD-10-CM | POA: Diagnosis not present

## 2021-03-27 DIAGNOSIS — M25661 Stiffness of right knee, not elsewhere classified: Secondary | ICD-10-CM

## 2021-03-27 DIAGNOSIS — M25551 Pain in right hip: Secondary | ICD-10-CM | POA: Diagnosis not present

## 2021-03-27 DIAGNOSIS — R262 Difficulty in walking, not elsewhere classified: Secondary | ICD-10-CM | POA: Diagnosis not present

## 2021-03-27 DIAGNOSIS — G4733 Obstructive sleep apnea (adult) (pediatric): Secondary | ICD-10-CM

## 2021-03-27 DIAGNOSIS — G4719 Other hypersomnia: Secondary | ICD-10-CM

## 2021-03-27 DIAGNOSIS — M6281 Muscle weakness (generalized): Secondary | ICD-10-CM | POA: Diagnosis not present

## 2021-03-27 DIAGNOSIS — Z9989 Dependence on other enabling machines and devices: Secondary | ICD-10-CM

## 2021-03-27 DIAGNOSIS — M25561 Pain in right knee: Secondary | ICD-10-CM | POA: Diagnosis not present

## 2021-03-27 DIAGNOSIS — M25512 Pain in left shoulder: Secondary | ICD-10-CM | POA: Diagnosis not present

## 2021-03-27 DIAGNOSIS — G8929 Other chronic pain: Secondary | ICD-10-CM | POA: Diagnosis not present

## 2021-03-27 NOTE — Progress Notes (Signed)
Virtual Visit via Video Note   This visit type was conducted due to national recommendations for restrictions regarding the COVID-19 Pandemic (e.g. social distancing) in an effort to limit this patient's exposure and mitigate transmission in our community.  Due to her co-morbid illnesses, this patient is at least at moderate risk for complications without adequate follow up.  This format is felt to be most appropriate for this patient at this time.  All issues noted in this document were discussed and addressed.  A limited physical exam was performed with this format.  Please refer to the patient's chart for her consent to telehealth for [facility].     Date:  03/27/2021   ID:  Ann Greene, DOB 1966/07/14, MRN [id number] The patient was identified using 2 identifiers.  Patient Location: Home Provider Location: Office/Clinic   PCP:  Pcp, No   [facility] Providers Cardiologist:  None     Evaluation Performed:  Follow-Up Visit  Chief Complaint:  OSA  History of Present Illness:    Ann Greene is a 55 y.o. female with Ann Greene is a 55 y.o. female with a hx of anxiety and panic disorder, hypothyroidism with a history of Hashimoto's disease, positive ANA, osteoarthritis of the right knee, hyperlipidemia, adult ADHD previously on Vyvanse, and excessive daytime sleepiness as well as SVT.  She is followed by Dr. Jacques Navy and complained of snoring and excessive daytime sleepiness and a home sleep study done which showed severe OSA with an AHI of 30/hr and nocturnal hypoxemia with a minimum O2 sat of 82%.  She underwent CPAP titration to 16cm H2O.   She is doing well with her CPAP device and thinks that she has gotten used to it.  She tolerates the mask and feels the pressure is adequate.  Since going on CPAP she feels rested in the am and has no significant daytime sleepiness.  She denies any significant mouth or nasal dryness or nasal congestion.  She does not think that he snores.     The patient does not have symptoms concerning for COVID-19 infection (fever, chills, cough, or new shortness of breath).    Past Medical History:  Diagnosis Date  . Thyroid disease    Past Surgical History:  Procedure Laterality Date  . CHOLECYSTECTOMY  1996  . TONSILLECTOMY       Current Meds  Medication Sig  . DULoxetine (CYMBALTA) 60 MG capsule TAKE 1 CAPSULE (60 MG TOTAL) BY MOUTH DAILY. DUE FOR FOLLOW UP.  . levothyroxine (SYNTHROID) 150 MCG tablet TAKE 1 TABLET BY MOUTH EVERY DAY BEFORE BREAKFAST  . metoprolol tartrate (LOPRESSOR) 25 MG tablet Take 1 tablet (25 mg total) by mouth 2 (two) times daily as needed. MAY TAKE TWICE DAILY PRN (AS NEEDED) FOR PALPITATIONS     Allergies:   Patient has no known allergies.   Social History   Tobacco Use  . Smoking status: Former Games developer  . Smokeless tobacco: Never Used  Substance Use Topics  . Alcohol use: Yes    Alcohol/week: 0.0 standard drinks    Comment: occasional  . Drug use: No     Family Hx: The patient's family history includes Alcohol abuse in her brother, father, and paternal uncle; Cancer in her brother and paternal uncle; Heart attack in her father and maternal grandmother; Suicidality in her brother.  ROS:   Please see the history of present illness.     All other systems reviewed and are negative.   Prior CV studies:  The following studies were reviewed today:  Sleep study, PAP compliance download and CPAP titration  Labs/Other Tests and Data Reviewed:    EKG:  No ECG reviewed.  Recent Labs: No results found for requested labs within last 8760 hours.   Recent Lipid Panel Lab Results  Component Value Date/Time   CHOL 199 09/16/2020 09:12 AM   TRIG 106 09/16/2020 09:12 AM   HDL 51 09/16/2020 09:12 AM   CHOLHDL 3.9 09/16/2020 09:12 AM   CHOLHDL 4.0 03/16/2019 08:42 AM   LDLCALC 129 (H) 09/16/2020 09:12 AM   LDLCALC 133 (H) 03/16/2019 08:42 AM    Wt Readings from Last 3 Encounters:   03/27/21 (!) 311 lb (141.1 kg)  03/21/21 (!) 311 lb 6.4 oz (141.3 kg)  01/30/21 (!) 314 lb 6.4 oz (142.6 kg)     Risk Assessment/Calculations:      Objective:    Vital Signs:  Ht 5\' 9"  (1.753 m)   Wt (!) 311 lb (141.1 kg)   BMI 45.93 kg/m    VITAL SIGNS:  reviewed GEN:  no acute distress EYES:  sclerae anicteric, EOMI - Extraocular Movements Intact RESPIRATORY:  normal respiratory effort, symmetric expansion CARDIOVASCULAR:  no peripheral edema SKIN:  no rash, lesions or ulcers. MUSCULOSKELETAL:  no obvious deformities. NEURO:  alert and oriented x 3, no obvious focal deficit PSYCH:  normal affect  ASSESSMENT & PLAN:    OSA - The patient is tolerating PAP therapy well without any problems. The PAP download performed by his DME was personally reviewed and interpreted by me today and showed an AHI of 1.3/hr on 16 cm H2O with 83% compliance in using more than 4 hours nightly.  The patient has been using and benefiting from PAP use and will continue to benefit from therapy.   2.  Snoring/Excessive daytime fatigue -this has significantly improved with treatment of her OSA  3.  SVT -her symptoms of palpitations have improved with treatment of her OSA -Continue prescription drug management with Lopressor PRN for palpitations    COVID-19 Education: The signs and symptoms of COVID-19 were discussed with the patient and how to seek care for testing (follow up with PCP or arrange E-visit).  The importance of social distancing was discussed today.  Time Spent: 20 minutes total time of encounter, including  10 minutes spent in face-to-face patient care on the date of this encounter. This time includes coordination of care and counseling regarding above mentioned problem list. Remainder of non-face-to-face time involved reviewing chart documents/testing relevant to the patient encounter and documentation in the medical record. I have independently reviewed documentation from referring  provider   Medication Adjustments/Labs and Tests Ordered: Current medicines are reviewed at length with the patient today.  Concerns regarding medicines are outlined above.   Tests Ordered: No orders of the defined types were placed in this encounter.   Medication Changes: No orders of the defined types were placed in this encounter.   Follow Up:  In Person 1 year  Signed, , MD  03/27/2021 9:05 AM    [facility]

## 2021-03-27 NOTE — Patient Instructions (Signed)

## 2021-03-28 ENCOUNTER — Encounter (HOSPITAL_BASED_OUTPATIENT_CLINIC_OR_DEPARTMENT_OTHER): Payer: Self-pay | Admitting: Physical Therapy

## 2021-03-28 NOTE — Therapy (Addendum)
[facility] [address] [place], [place], [address] Phone: [phone]   Fax:  [phone]  Physical Therapy Treatment  Patient Details  Name: Ann Greene MRN: [id number] Date of Birth: [birth year] Referring Provider (PT): de Peru, MD Subjective My knee hurt a little after least session but I do think it helps working in the water.  Encounter Date: 03/27/2021   PT End of Session - 03/28/21 1254     Visit Number 7    Number of Visits 17    Date for PT Re-Evaluation 04/11/21    Authorization Type AETNA   *no IONTO    PT Start Time 1116    PT Stop Time 1205    PT Time Calculation (min) 49 min    Equipment Utilized During Treatment Other (comment)    Activity Tolerance Patient tolerated treatment well    Behavior During Therapy WFL for tasks assessed/performed             Past Medical History:  Diagnosis Date   Thyroid disease     Past Surgical History:  Procedure Laterality Date   CHOLECYSTECTOMY  1996   TONSILLECTOMY      There were no vitals filed for this visit.     TREATMENT Pt seen for aquatic therapy today.  Treatment took place in water 3.25-4.8 ft in depth at the [facility] pool. Temp of water was 91.  Pt entered/exited the pool via stairs step through pattern independently with bilat rail. Warm up: forward, backward and side stepping/walking cues for increased step length, increased speed, hand placement to increase resistance. Stretching:  IT band standing 2x10, also completed suspended vertically 2 x 10.  Post chain stretching suspended prone: knees to chest, cat/camel.  Strengthening Core: planks, kick board push downs, cuing for core stabilization control--  Knee flex/ext resisted by Noodles and foam ankle cuffs. Prone kicking at hip then at knee. Vertically suspended: add/abd, hip flex and ext, hip rotation. Balance static and dynamic: Noodle push downs 2 x 10 reps holding to barbell,  progressing to kick board pushdowns 2 x 10 reps.   Pt requires buoyancy for support and to offload joints with strengthening exercises. Viscosity of the water is needed for resistance of strengthening; water current perturbations provides challenge to standing balance unsupported, requiring increased core activation.                            PT Short Term Goals - 02/17/21 1013       PT SHORT TERM GOAL #1   Title independent in posture corrections    Baseline aware    Status Achieved               PT Long Term Goals - 02/10/21 1258       PT LONG TERM GOAL #1   Title 5/5 hip strength    Baseline see flowsheet    Time 8    Period Weeks    Status New    Target Date 04/11/21      PT LONG TERM GOAL #2   Title pt will be able to amb for at least 1 hour in prep for trip to [facility]    Baseline 10-15 min at eval    Time 8    Period Weeks    Status New    Target Date 04/11/21      PT LONG TERM GOAL #3   Title able to  perform reaching and lifting activities without incr in shoulder pain    Baseline painful at eval    Time 8    Period Weeks    Status New    Target Date 04/11/21      PT LONG TERM GOAL #4   Title independent in long term HEP for strength & stability    Baseline will progress as appropriate    Time 8    Period Weeks    Status New    Target Date 04/11/21                   Plan - 03/28/21 1315     Clinical Impression Statement Completed stretching of IT band in aquatic setting, able to assist manually with increased ease in environment. Pt with bruising along IT band area from her "grabbing and punching" to decrease the burning she was having.  Pt instructed on positon to stretch/ ease discomfort rather than grab. Knee pain improved since last visit. Improved balance as demonstrated through noodle push downs hold to barbells without LOB.    Personal Factors and Comorbidities Time since onset of  injury/illness/exacerbation;Fitness;Profession    Examination-Activity Limitations Squat;Lift;Stairs;Bend;Stand;Locomotion Level;Caring for Others;Reach Overhead;Carry;Sit    Examination-Participation Restrictions Cleaning;Community Activity;Occupation;Other    Stability/Clinical Decision Making Evolving/Moderate complexity    Clinical Decision Making Moderate    Rehab Potential Good    PT Frequency 2x / week    PT Duration 8 weeks    PT Treatment/Interventions ADLs/Self Care Home Management;Cryotherapy;Ultrasound;Traction;Moist Heat;Electrical Stimulation;Functional mobility training;Neuromuscular re-education;Balance training;Therapeutic exercise;Therapeutic activities;Patient/family education;Manual techniques;Passive range of motion;Dry needling;Taping;Spinal Manipulations;Joint Manipulations;Iontophoresis 4mg /ml Dexamethasone    PT Next Visit Plan IT band??- land based    PT Home Exercise Plan [facility]    Consulted and Agree with Plan of Care Patient             Patient will benefit from skilled therapeutic intervention in order to improve the following deficits and impairments:  Difficulty walking,Increased muscle spasms,Improper body mechanics,Pain,Postural dysfunction,Impaired UE functional use,Decreased strength,Decreased activity tolerance  Visit Diagnosis: Chronic pain of right knee  Stiffness of right knee, not elsewhere classified  Muscle weakness (generalized)  Chronic midline low back pain without sciatica  Chronic bilateral low back pain without sciatica  Pain in right hip  Difficulty in walking, not elsewhere classified     Problem List Patient Active Problem List   Diagnosis Date Noted   Rotator cuff strain 01/30/2021   Paroxysmal supraventricular tachycardia (HCC) 01/29/2020   Adult ADHD 03/14/2019   Severe obstructive sleep apnea 03/14/2019   Pelvic pain in female 11/24/2017   Hyperlipidemia, mixed 08/16/2017   Plantar fasciitis, right 07/07/2017    Primary osteoarthritis of right knee 11/26/2016   Obesity 01/30/2016   Generalized anxiety disorder 12/05/2015   Treadmill stress test negative for angina pectoris 12/05/2015   Hypothyroidism 12/05/2015   Positive ANA (antinuclear antibody) 12/05/2015    02/02/2016 MPT 03/28/2021, 4:02 PM  [facility] [address] [place], [place], [place] Phone: [phone]   Fax:  [phone]  Name: Ann Greene MRN: Owens Shark Date of Birth: [birth year]   Addended 03/05/1968 Corrie Dandy) Ellise Kovack MPT 05/13/21

## 2021-04-01 ENCOUNTER — Other Ambulatory Visit: Payer: Self-pay

## 2021-04-01 ENCOUNTER — Encounter (HOSPITAL_BASED_OUTPATIENT_CLINIC_OR_DEPARTMENT_OTHER): Payer: Self-pay | Admitting: Physical Therapy

## 2021-04-01 ENCOUNTER — Ambulatory Visit (HOSPITAL_BASED_OUTPATIENT_CLINIC_OR_DEPARTMENT_OTHER): Payer: 59 | Admitting: Physical Therapy

## 2021-04-01 DIAGNOSIS — M25512 Pain in left shoulder: Secondary | ICD-10-CM | POA: Diagnosis not present

## 2021-04-01 DIAGNOSIS — G8929 Other chronic pain: Secondary | ICD-10-CM

## 2021-04-01 DIAGNOSIS — M6281 Muscle weakness (generalized): Secondary | ICD-10-CM

## 2021-04-01 DIAGNOSIS — M25661 Stiffness of right knee, not elsewhere classified: Secondary | ICD-10-CM | POA: Diagnosis not present

## 2021-04-01 DIAGNOSIS — R262 Difficulty in walking, not elsewhere classified: Secondary | ICD-10-CM | POA: Diagnosis not present

## 2021-04-01 DIAGNOSIS — M545 Low back pain, unspecified: Secondary | ICD-10-CM

## 2021-04-01 DIAGNOSIS — M25561 Pain in right knee: Secondary | ICD-10-CM | POA: Diagnosis not present

## 2021-04-01 DIAGNOSIS — M25551 Pain in right hip: Secondary | ICD-10-CM | POA: Diagnosis not present

## 2021-04-01 NOTE — Therapy (Addendum)
[place], [place], [address] Phone: [phone]   Fax:  [phone]  Physical Therapy Treatment/Discharge  Patient Details  Name: Ann Greene MRN: [id number] Date of Birth: 02-03-1966 Referring Provider (PT): [place], MD   Encounter Date: 04/01/2021   PT End of Session - 04/01/21 0846     Visit Number 8    Number of Visits 17    Date for PT Re-Evaluation 04/11/21    Authorization Type AETNA   *no IONTO    PT Start Time 0844    PT Stop Time 0936    PT Time Calculation (min) 52 min    Activity Tolerance Patient tolerated treatment well    Behavior During Therapy [facility] for tasks assessed/performed             Past Medical History:  Diagnosis Date   Thyroid disease     Past Surgical History:  Procedure Laterality Date   CHOLECYSTECTOMY  1996   TONSILLECTOMY      There were no vitals filed for this visit.   Subjective Assessment - 04/01/21 0844     Subjective I like water therapy. I did some exercises in the pool while on vacation so I feel it in my knee but my neck is tight after driving and being in a different bed.    Patient Stated Goals hike in August through [place] a couple of miles    Currently in Pain? Yes    Pain Location Neck    Pain Orientation Right;Left    Pain Descriptors / Indicators Tightness    Aggravating Factors  travel    Pain Relieving Factors heat, stretch, massage                [facility] PT Assessment - 04/01/21 0001       ROM / Strength   AROM / PROM / Strength AROM      AROM   AROM Assessment Site Cervical    Cervical - Right Side Bend 30    Cervical - Left Side Bend 30    Cervical - Right Rotation 56    Cervical - Left Rotation 50                           [facility] Adult PT Treatment/Exercise - 04/01/21 0001       Shoulder Exercises: Standing   Row Both;20 reps    Theraband Level (Shoulder Row) Level 3 (Green)      Shoulder  Exercises: ROM/Strengthening   Other ROM/Strengthening Exercises machines: lat pull down, biceps curl 10lb, triceps press 10lb      Moist Heat Therapy   Number Minutes Moist Heat 10 Minutes    Moist Heat Location Other (comment)   cervical-thoracic region     Manual Therapy   Manual therapy comments skilled palpation and monitoring during TPDN    Joint Mobilization rib cage bil, lower cervica-upper thoracic PA mobs with cavitations at T4; Rt FA joint AP in flex/adduction    Soft tissue mobilization bil upper trap & levator scap              Trigger Point Dry Needling - 04/01/21 0001     Upper Trapezius Response Twitch reponse elicited;Palpable increased muscle length   bil                   PT Short Term Goals - 02/17/21 1013  PT SHORT TERM GOAL #1   Title independent in posture corrections    Baseline aware    Status Achieved               PT Long Term Goals - 02/10/21 1258       PT LONG TERM GOAL #1   Title 5/5 hip strength    Baseline see flowsheet    Time 8    Period Weeks    Status New    Target Date 04/11/21      PT LONG TERM GOAL #2   Title pt will be able to amb for at least 1 hour in prep for trip to [place] 10-15 min at eval    Time 8    Period Weeks    Status New    Target Date 04/11/21      PT LONG TERM GOAL #3   Title able to perform reaching and lifting activities without incr in shoulder pain    Baseline painful at eval    Time 8    Period Weeks    Status New    Target Date 04/11/21      PT LONG TERM GOAL #4   Title independent in long term HEP for strength & stability    Baseline will progress as appropriate    Time 8    Period Weeks    Status New    Target Date 04/11/21                   Plan - 04/01/21 9528     Clinical Impression Statement Tension noted in bil shoulders which led to tension into thoracic spine. Improved mobility with manual therapy and proper activation using machines.  Will be sore but should expect to have less restricted motion. Limited mobility in post hip joint on Rt side improved with mobs.    PT Treatment/Interventions ADLs/Self Care Home Management;Cryotherapy;Ultrasound;Traction;Moist Heat;Electrical Stimulation;Functional mobility training;Neuromuscular re-education;Balance training;Therapeutic exercise;Therapeutic activities;Patient/family education;Manual techniques;Passive range of motion;Dry needling;Taping;Spinal Manipulations;Joint Manipulations;Iontophoresis 24m/ml Dexamethasone    PT Next Visit Plan utilize post hip capsule motion    PT Home Exercise Plan [id number]    Consulted and Agree with Plan of Care Patient             Patient will benefit from skilled therapeutic intervention in order to improve the following deficits and impairments:  Difficulty walking,Increased muscle spasms,Improper body mechanics,Pain,Postural dysfunction,Impaired UE functional use,Decreased strength,Decreased activity tolerance  Visit Diagnosis: Chronic pain of right knee  Stiffness of right knee, not elsewhere classified  Muscle weakness (generalized)  Chronic midline low back pain without sciatica     Problem List Patient Active Problem List   Diagnosis Date Noted   Rotator cuff strain 01/30/2021   Parox[place] tachy[place] 01/29/2020   Adult ADHD 03/14/2019   Severe obstructive sleep apnea 03/14/2019   Pelvic pain in female 11/24/2017   Hyperlipidemia, mixed 08/16/2017   Plantar fasciitis, right 07/07/2017   Primary osteoarthritis of right knee 11/26/2016   Obesity 01/30/2016   Generalized anxiety disorder 12/05/2015   Treadmill stress test negative for angina pectoris 12/05/2015   Hypothyroidism 12/05/2015   Positive ANA (antinuclear antibody) 12/05/2015    Corona Popovich C. Carlicia Leavens PT, DPT 04/01/21 9:31 AM   [facility] [address]  [address]Phone: [phone]  Fax:  [phone] Name: Ann DesouzaMRN: [id number] of Birth: 312/20/1967 PHYSICAL THERAPY DISCHARGE SUMMARY  Visits from Start of Care: 8  Current functional level related to goals / functional outcomes: See above   Remaining deficits: See above   Education / Equipment: Anatomy of condition, POC, HEP, exercise form/rationale   Patient agrees to discharge. Patient goals were partially met. Patient is being discharged due to not returning since the last visit. Yzabelle Calles C. Saeed Toren PT, DPT 07/03/21 12:16 PM

## 2021-04-03 ENCOUNTER — Ambulatory Visit (HOSPITAL_BASED_OUTPATIENT_CLINIC_OR_DEPARTMENT_OTHER): Payer: BLUE CROSS/BLUE SHIELD | Admitting: Physical Therapy

## 2021-04-04 ENCOUNTER — Encounter (HOSPITAL_BASED_OUTPATIENT_CLINIC_OR_DEPARTMENT_OTHER): Payer: BLUE CROSS/BLUE SHIELD | Admitting: Physical Therapy

## 2021-04-07 ENCOUNTER — Ambulatory Visit (HOSPITAL_BASED_OUTPATIENT_CLINIC_OR_DEPARTMENT_OTHER): Payer: BLUE CROSS/BLUE SHIELD | Admitting: Physical Therapy

## 2021-04-09 ENCOUNTER — Encounter (HOSPITAL_BASED_OUTPATIENT_CLINIC_OR_DEPARTMENT_OTHER): Payer: BLUE CROSS/BLUE SHIELD | Admitting: Physical Therapy

## 2021-04-10 ENCOUNTER — Ambulatory Visit (HOSPITAL_BASED_OUTPATIENT_CLINIC_OR_DEPARTMENT_OTHER): Payer: BLUE CROSS/BLUE SHIELD | Admitting: Physical Therapy

## 2021-04-16 DIAGNOSIS — G4733 Obstructive sleep apnea (adult) (pediatric): Secondary | ICD-10-CM | POA: Diagnosis not present

## 2021-04-20 DIAGNOSIS — G4733 Obstructive sleep apnea (adult) (pediatric): Secondary | ICD-10-CM | POA: Diagnosis not present

## 2021-05-09 DIAGNOSIS — G4733 Obstructive sleep apnea (adult) (pediatric): Secondary | ICD-10-CM | POA: Diagnosis not present

## 2021-05-16 DIAGNOSIS — G4733 Obstructive sleep apnea (adult) (pediatric): Secondary | ICD-10-CM | POA: Diagnosis not present

## 2021-05-20 DIAGNOSIS — G4733 Obstructive sleep apnea (adult) (pediatric): Secondary | ICD-10-CM | POA: Diagnosis not present

## 2021-05-21 ENCOUNTER — Ambulatory Visit (INDEPENDENT_AMBULATORY_CARE_PROVIDER_SITE_OTHER): Payer: 59

## 2021-05-21 ENCOUNTER — Ambulatory Visit (INDEPENDENT_AMBULATORY_CARE_PROVIDER_SITE_OTHER): Payer: 59 | Admitting: Sports Medicine

## 2021-05-21 ENCOUNTER — Other Ambulatory Visit: Payer: Self-pay

## 2021-05-21 DIAGNOSIS — M7071 Other bursitis of hip, right hip: Secondary | ICD-10-CM

## 2021-05-21 DIAGNOSIS — F411 Generalized anxiety disorder: Secondary | ICD-10-CM

## 2021-05-21 DIAGNOSIS — E6609 Other obesity due to excess calories: Secondary | ICD-10-CM | POA: Diagnosis not present

## 2021-05-21 DIAGNOSIS — M25551 Pain in right hip: Secondary | ICD-10-CM | POA: Diagnosis not present

## 2021-05-21 DIAGNOSIS — R69 Illness, unspecified: Secondary | ICD-10-CM | POA: Diagnosis not present

## 2021-05-21 MED ORDER — MELOXICAM 15 MG PO TABS: ORAL_TABLET | ORAL | 3 refills | Status: DC

## 2021-05-21 MED ORDER — BUPROPION HCL ER (XL) 150 MG PO TB24: 150.0000 mg | ORAL_TABLET | ORAL | 3 refills | Status: DC

## 2021-05-21 NOTE — Assessment & Plan Note (Signed)
Had a long discussion, I really think she is a candidate for bariatric surgery, referral to the bariatric surgery clinic, we are also starting Wellbutrin.

## 2021-05-21 NOTE — Assessment & Plan Note (Signed)
This is a very pleasant 55 year old female with a history of anxiety and depression, she has been well controlled on Cymbalta however recently due to multiple life events including the passing of her mother a couple months ago, her weight, etc., she has felt quite depressed and anxious. Will continue Cymbalta, she has used Wellbutrin in the past with good efficacy and I think this would be a good combination therapy as she is a smoker and does need to lose weight. We can kill 3 birds with 1 stone here. Adding Wellbutrin 150, return to see me in 4 weeks for repeat PHQ/GAD. We will continue to taper up her Cymbalta and Wellbutrin until we get good results, she has been on Cymbalta for a while so ultimately we may need to rotate this to a different SNRI.

## 2021-05-21 NOTE — Progress Notes (Signed)
    Procedures performed today:    None.  Independent interpretation of notes and tests performed by another provider:   None.  Brief History, Exam, Impression, and Recommendations:    Generalized anxiety disorder This is a very pleasant 55 year old female with a history of anxiety and depression, she has been well controlled on Cymbalta however recently due to multiple life events including the passing of her mother a couple months ago, her weight, etc., she has felt quite depressed and anxious. Will continue Cymbalta, she has used Wellbutrin in the past with good efficacy and I think this would be a good combination therapy as she is a smoker and does need to lose weight. We can kill 3 birds with 1 stone here. Adding Wellbutrin 150, return to see me in 4 weeks for repeat PHQ/GAD. We will continue to taper up her Cymbalta and Wellbutrin until we get good results, she has been on Cymbalta for a while so ultimately we may need to rotate this to a different SNRI.  Ischial bursitis, right Kirt Boys has a recurrence of right ischial bursitis, we treated this about 4 years ago and she did well with meloxicam, rehab exercises. Adding updated pelvic x-rays, restarting meloxicam, conditioning exercises given, return to see me in a month, we will do a ischial bursa injection if no better, she does have a prolonged trip coming up to [place] afterwards.  Obesity Had a long discussion, I really think she is a candidate for bariatric surgery, referral to the bariatric surgery clinic, we are also starting Wellbutrin.    ___________________________________________ [name]. [name], M.D., ABFM., CAQSM. Primary Care and Sports Medicine [place] [facility]  Adjunct Instructor of Family Medicine  [employer]

## 2021-05-21 NOTE — Assessment & Plan Note (Signed)
Ann Greene has a recurrence of right ischial bursitis, we treated this about 4 years ago and she did well with meloxicam, rehab exercises. Adding updated pelvic x-rays, restarting meloxicam, conditioning exercises given, return to see me in a month, we will do a ischial bursa injection if no better, she does have a prolonged trip coming up to [place] afterwards.

## 2021-06-09 DIAGNOSIS — G4733 Obstructive sleep apnea (adult) (pediatric): Secondary | ICD-10-CM | POA: Diagnosis not present

## 2021-06-10 ENCOUNTER — Ambulatory Visit (HOSPITAL_BASED_OUTPATIENT_CLINIC_OR_DEPARTMENT_OTHER): Payer: BLUE CROSS/BLUE SHIELD | Admitting: Obstetrics & Gynecology

## 2021-06-12 ENCOUNTER — Encounter (HOSPITAL_BASED_OUTPATIENT_CLINIC_OR_DEPARTMENT_OTHER): Payer: Self-pay | Admitting: Obstetrics & Gynecology

## 2021-06-12 ENCOUNTER — Ambulatory Visit (HOSPITAL_BASED_OUTPATIENT_CLINIC_OR_DEPARTMENT_OTHER): Payer: 59 | Admitting: Obstetrics & Gynecology

## 2021-06-12 ENCOUNTER — Other Ambulatory Visit: Payer: Self-pay | Admitting: Sports Medicine

## 2021-06-12 ENCOUNTER — Other Ambulatory Visit: Payer: Self-pay

## 2021-06-12 ENCOUNTER — Other Ambulatory Visit (HOSPITAL_COMMUNITY)
Admission: RE | Admit: 2021-06-12 | Discharge: 2021-06-12 | Disposition: A | Discharge status: Home / Self Care | Payer: 59 | Source: Ambulatory Visit | Attending: Obstetrics & Gynecology | Admitting: Obstetrics & Gynecology

## 2021-06-12 ENCOUNTER — Ambulatory Visit (HOSPITAL_BASED_OUTPATIENT_CLINIC_OR_DEPARTMENT_OTHER)
Admission: RE | Admit: 2021-06-12 | Discharge: 2021-06-12 | Disposition: A | Discharge status: Home / Self Care | Payer: 59 | Source: Ambulatory Visit | Attending: Obstetrics & Gynecology | Admitting: Obstetrics & Gynecology

## 2021-06-12 VITALS — BP 146/63 | Ht 68.5 in | Wt 316.8 lb

## 2021-06-12 DIAGNOSIS — Z1231 Encounter for screening mammogram for malignant neoplasm of breast: Secondary | ICD-10-CM | POA: Diagnosis not present

## 2021-06-12 DIAGNOSIS — E6609 Other obesity due to excess calories: Secondary | ICD-10-CM

## 2021-06-12 DIAGNOSIS — Z124 Encounter for screening for malignant neoplasm of cervix: Secondary | ICD-10-CM | POA: Diagnosis not present

## 2021-06-12 DIAGNOSIS — Z01419 Encounter for gynecological examination (general) (routine) without abnormal findings: Secondary | ICD-10-CM

## 2021-06-12 DIAGNOSIS — N943 Premenstrual tension syndrome: Secondary | ICD-10-CM

## 2021-06-12 DIAGNOSIS — L292 Pruritus vulvae: Secondary | ICD-10-CM | POA: Diagnosis not present

## 2021-06-12 DIAGNOSIS — F411 Generalized anxiety disorder: Secondary | ICD-10-CM

## 2021-06-12 DIAGNOSIS — Z8742 Personal history of other diseases of the female genital tract: Secondary | ICD-10-CM

## 2021-06-12 MED ORDER — NORETHINDRONE 0.35 MG PO TABS
1.0000 | ORAL_TABLET | Freq: Every day | ORAL | 1 refills | Status: DC
Start: 2021-06-12 — End: 2021-11-24

## 2021-06-12 MED ORDER — CLOBETASOL PROPIONATE 0.05 % EX OINT: 1.0000 "application " | TOPICAL_OINTMENT | Freq: Two times a day (BID) | CUTANEOUS | 0 refills | Status: AC

## 2021-06-12 NOTE — Progress Notes (Signed)
55 y.o. G0P0000 Adopted [employer] Married White female here for annual exam/new patient exam.  She is not new to [place].  Has been here about 6-7 years.  She is still having menstrual cycles.  Has hx of PCOS.  These are monthly, for the most part.  Cycle lasts 4-6.  Sometimes there is heaviness.  She has a lot of nausea during the first day of her cycle.  This is not new.  She has significant cramping.    Having a lot of dryness and itching.  This feels external.    Does have urinary urgency.  She can hold her urine but the once she has to void, she really needs to go.    Just started wellbutrin.  Has follow up schedule with PCP next week.  Patient's last menstrual period was 05/27/2021 (approximate).          Sexually active: Yes.    The current method of family planning is none.    Exercising: Yes.     Smoker:  yes, just started again  Health Maintenance: Pap:  11/24/2017 Negative History of abnormal Pap:  yes MMG:  07/20/2018 Negative Colonoscopy:  cologuard neg 02/2020 BMD:   not indicated TDaP:  declines Hep C testing: 12/2015 Screening Labs: blood work scheduled with cardiologist in the early fall   reports that she has been smoking cigarettes. She started smoking about 6 months ago. She has been smoking an average of .5 packs per day. She has never used smokeless tobacco. She reports current alcohol use. She reports that she does not use drugs.  Past Medical History:  Diagnosis Date   Thyroid disease     Past Surgical History:  Procedure Laterality Date   CHOLECYSTECTOMY  1996   TONSILLECTOMY      Current Outpatient Medications  Medication Sig Dispense Refill   buPROPion (WELLBUTRIN XL) 150 MG 24 hr tablet Take 1 tablet (150 mg total) by mouth every morning. 30 tablet 3   DULoxetine (CYMBALTA) 60 MG capsule TAKE 1 CAPSULE (60 MG TOTAL) BY MOUTH DAILY. DUE FOR FOLLOW UP. 90 capsule 1   levothyroxine (SYNTHROID) 150 MCG tablet TAKE 1 TABLET BY MOUTH EVERY DAY BEFORE BREAKFAST  90 tablet 3   meloxicam (MOBIC) 15 MG tablet One tab PO qAM with a meal for 2 weeks, then daily prn pain. 30 tablet 3   metoprolol tartrate (LOPRESSOR) 25 MG tablet Take 1 tablet (25 mg total) by mouth 2 (two) times daily as needed. MAY TAKE TWICE DAILY PRN (AS NEEDED) FOR PALPITATIONS 60 tablet 3   No current facility-administered medications for this visit.    Family History  Problem Relation Age of Onset   Alcohol abuse Father    Heart attack Father    Alcohol abuse Brother    Cancer Brother        testicular   Suicidality Brother    Alcohol abuse Paternal Uncle    Cancer Paternal Uncle        colon   Heart attack Maternal Grandmother     Review of Systems  Constitutional: Negative.    Exam:   BP (!) 146/63 (BP Location: Left Arm, Patient Position: Sitting, Cuff Size: Large)   Ht 5' 8.5" (1.74 m)   Wt (!) 316 lb 12.8 oz (143.7 kg)   LMP 05/27/2021 (Approximate)   BMI 47.47 kg/m   Height: 5' 8.5" (174 cm)  General appearance: alert, cooperative and appears stated age Head: Normocephalic, without obvious abnormality, atraumatic Neck: no  adenopathy, supple, symmetrical, trachea midline and thyroid normal to inspection and palpation Lungs: clear to auscultation bilaterally Breasts: normal appearance, no masses or tenderness Heart: regular rate and rhythm Abdomen: soft, non-tender; bowel sounds normal; no masses,  no organomegaly Extremities: extremities normal, atraumatic, no cyanosis or edema Skin: Skin color, texture, turgor normal. No rashes or lesions Lymph nodes: Cervical, supraclavicular, and axillary nodes normal. No abnormal inguinal nodes palpated Neurologic: Grossly normal   Pelvic: External genitalia:  hypopigmentation of inner labia majora noted in symmetric pattern              Urethra:  normal appearing urethra with no masses, tenderness or lesions              Bartholins and Skenes: normal                 Vagina: normal appearing vagina with normal color  and no discharge, no lesions              Cervix: no lesions              Pap taken: Yes.   Bimanual Exam:  Uterus:  normal size, contour, position, consistency, mobility, non-tender              Adnexa: normal adnexa and no mass, fullness, tenderness               Rectovaginal: Confirms               Anus:  normal sphincter tone, no lesions  Chaperone, Ina Homes, CMA, was present for exam.  Assessment/Plan: 1. Well woman exam with routine gynecological exam - pap smear and HR HPV obtained today - MMG ordered and scheduled for today - cologuard neg 02/2020 - lab work done with PCP - vaccines updated  2. Vulvar itching - diagnoses discussed.  Possible need for vulvar biopsy obtained. - Will treat with clobetasol 0.05% ointment BID for one month.  Will then start with nightly treatment.  Recheck 6 months.  She may need vulvar biopsy at follow up.  3. PMS (premenstrual syndrome) - options for treatment discussed - norethindrone (MICRONOR) 0.35 MG tablet; Take 1 tablet (0.35 mg total) by mouth daily.  Dispense: 84 tablet; Refill: 1  4. History of PCOS  5. Obesity due to excess calories with serious comorbidity, unspecified classification

## 2021-06-13 ENCOUNTER — Encounter: Payer: Self-pay | Admitting: Sports Medicine

## 2021-06-13 ENCOUNTER — Ambulatory Visit (INDEPENDENT_AMBULATORY_CARE_PROVIDER_SITE_OTHER): Payer: 59 | Admitting: Sports Medicine

## 2021-06-13 DIAGNOSIS — F17200 Nicotine dependence, unspecified, uncomplicated: Secondary | ICD-10-CM

## 2021-06-13 DIAGNOSIS — R69 Illness, unspecified: Secondary | ICD-10-CM | POA: Diagnosis not present

## 2021-06-13 DIAGNOSIS — F411 Generalized anxiety disorder: Secondary | ICD-10-CM

## 2021-06-13 DIAGNOSIS — M7071 Other bursitis of hip, right hip: Secondary | ICD-10-CM | POA: Diagnosis not present

## 2021-06-13 DIAGNOSIS — Z87891 Personal history of nicotine dependence: Secondary | ICD-10-CM | POA: Insufficient documentation

## 2021-06-13 LAB — CYTOLOGY - PAP
Adequacy: ABSENT
Comment: NEGATIVE
Diagnosis: NEGATIVE
High risk HPV: NEGATIVE

## 2021-06-13 MED ORDER — BUPROPION HCL ER (XL) 300 MG PO TB24: 300.0000 mg | ORAL_TABLET | Freq: Every day | ORAL | 3 refills | Status: DC

## 2021-06-13 NOTE — Assessment & Plan Note (Signed)
Down to 3 cigarettes/day from 6-8 with Wellbutrin 150, increasing medication.

## 2021-06-13 NOTE — Progress Notes (Signed)
    Procedures performed today:    None.  Independent interpretation of notes and tests performed by another provider:   None.  Brief History, Exam, Impression, and Recommendations:    Generalized anxiety disorder Ann Greene returns, she is a pleasant 55 year old female, with treating her for anxiety and depression. She continued Cymbalta and we added Wellbutrin at 150, symptoms improved considerably. Her smoking has also decreased from 6 to 8 cigarettes/day down to 3. Increasing Wellbutrin to 300 mg daily, return to see me in 6 weeks for repeat PHQ/GAD. She is going on her trip to [place] with her family very soon.  Ischial bursitis, right Improved considerably with meloxicam and conditioning exercises, follow-up as needed for this.  Smoker Down to 3 cigarettes/day from 6-8 with Wellbutrin 150, increasing medication.  Chronic process with exacerbation and pharmacologic intervention.  ___________________________________________ [name]. [name], M.D., ABFM., CAQSM. Primary Care and Sports Medicine  [facility]  Adjunct Instructor of Family Medicine  [employer]

## 2021-06-13 NOTE — Assessment & Plan Note (Signed)
Ann Greene returns, she is a pleasant 55 year old female, with treating her for anxiety and depression. She continued Cymbalta and we added Wellbutrin at 150, symptoms improved considerably. Her smoking has also decreased from 6 to 8 cigarettes/day down to 3. Increasing Wellbutrin to 300 mg daily, return to see me in 6 weeks for repeat PHQ/GAD. She is going on her trip to [place] with her family very soon.

## 2021-06-13 NOTE — Assessment & Plan Note (Signed)
Improved considerably with meloxicam and conditioning exercises, follow-up as needed for this.

## 2021-06-16 DIAGNOSIS — G4733 Obstructive sleep apnea (adult) (pediatric): Secondary | ICD-10-CM | POA: Diagnosis not present

## 2021-06-17 ENCOUNTER — Ambulatory Visit: Payer: 59 | Admitting: Sports Medicine

## 2021-07-05 ENCOUNTER — Other Ambulatory Visit: Payer: Self-pay | Admitting: Sports Medicine

## 2021-07-05 DIAGNOSIS — F411 Generalized anxiety disorder: Secondary | ICD-10-CM

## 2021-07-10 DIAGNOSIS — G4733 Obstructive sleep apnea (adult) (pediatric): Secondary | ICD-10-CM | POA: Diagnosis not present

## 2021-07-16 ENCOUNTER — Other Ambulatory Visit: Payer: Self-pay | Admitting: Sports Medicine

## 2021-07-16 DIAGNOSIS — F411 Generalized anxiety disorder: Secondary | ICD-10-CM

## 2021-07-17 DIAGNOSIS — G4733 Obstructive sleep apnea (adult) (pediatric): Secondary | ICD-10-CM | POA: Diagnosis not present

## 2021-07-29 ENCOUNTER — Ambulatory Visit: Payer: 59 | Admitting: Sports Medicine

## 2021-07-29 ENCOUNTER — Other Ambulatory Visit: Payer: Self-pay

## 2021-07-29 DIAGNOSIS — Z87891 Personal history of nicotine dependence: Secondary | ICD-10-CM

## 2021-07-29 DIAGNOSIS — F419 Anxiety disorder, unspecified: Secondary | ICD-10-CM

## 2021-07-29 DIAGNOSIS — F32A Depression, unspecified: Secondary | ICD-10-CM | POA: Diagnosis not present

## 2021-07-29 DIAGNOSIS — F40243 Fear of flying: Secondary | ICD-10-CM | POA: Diagnosis not present

## 2021-07-29 DIAGNOSIS — R69 Illness, unspecified: Secondary | ICD-10-CM | POA: Diagnosis not present

## 2021-07-29 MED ORDER — ALPRAZOLAM 0.25 MG PO TBDP: 0.2500 mg | ORAL_TABLET | Freq: Every day | ORAL | 0 refills | Status: DC | PRN

## 2021-07-29 MED ORDER — CALCIUM CARBONATE-VITAMIN D 600-400 MG-UNIT PO TABS: 1.0000 | ORAL_TABLET | Freq: Two times a day (BID) | ORAL | 11 refills | Status: AC

## 2021-07-29 NOTE — Assessment & Plan Note (Signed)
Quit smoking over the past month, congratulated.

## 2021-07-29 NOTE — Assessment & Plan Note (Signed)
Kirt Boys returns, she is a very pleasant 55 year old female, we have been treating her for anxiety and depression, she did well with Cymbalta, currently at 60 mg, we increased her Wellbutrin to 300 mg at the last visit and her symptoms have all but resolved, she is down to 0 cigarettes, we can leave things alone for now.

## 2021-07-29 NOTE — Assessment & Plan Note (Signed)
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

## 2021-07-29 NOTE — Addendum Note (Signed)
Addended by: Monica Becton on: 07/29/2021 09:59 AM   Modules accepted: Orders

## 2021-07-29 NOTE — Progress Notes (Signed)
    Procedures performed today:    None.  Independent interpretation of notes and tests performed by another provider:   None.  Brief History, Exam, Impression, and Recommendations:    Anxiety and depression Ann Greene returns, she is a very pleasant 55 year old female, we have been treating her for anxiety and depression, she did well with Cymbalta, currently at 60 mg, we increased her Wellbutrin to 300 mg at the last visit and her symptoms have all but resolved, she is down to 0 cigarettes, we can leave things alone for now.  Former smoker Quit smoking over the past month, congratulated.  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    ___________________________________________ [name]. [name], M.D., ABFM., CAQSM. Primary Care and Sports Medicine [place] [facility]  Adjunct Instructor of Family Medicine  [employer]

## 2021-07-30 ENCOUNTER — Ambulatory Visit (HOSPITAL_BASED_OUTPATIENT_CLINIC_OR_DEPARTMENT_OTHER): Payer: 59 | Admitting: Obstetrics & Gynecology

## 2021-07-30 ENCOUNTER — Encounter (HOSPITAL_BASED_OUTPATIENT_CLINIC_OR_DEPARTMENT_OTHER): Payer: Self-pay | Admitting: Obstetrics & Gynecology

## 2021-07-30 VITALS — BP 131/89 | HR 60 | Ht 69.0 in | Wt 319.4 lb

## 2021-07-30 DIAGNOSIS — L292 Pruritus vulvae: Secondary | ICD-10-CM

## 2021-07-30 DIAGNOSIS — L900 Lichen sclerosus et atrophicus: Secondary | ICD-10-CM

## 2021-07-30 MED ORDER — MOMETASONE FUROATE 0.1 % EX OINT: TOPICAL_OINTMENT | CUTANEOUS | 3 refills | Status: AC

## 2021-07-30 NOTE — Patient Instructions (Signed)
Continue to use the clobetasol ointment nightly for the next four weeks.  Let me know if you are still having any itching or burning.  If not, transition to the mometasone topically twice weekly for maintenance.

## 2021-07-31 DIAGNOSIS — L900 Lichen sclerosus et atrophicus: Secondary | ICD-10-CM | POA: Insufficient documentation

## 2021-07-31 NOTE — Progress Notes (Signed)
GYNECOLOGY  VISIT  CC:   vulvar receck  HPI: 55 y.o. G0P0000 Married White or Caucasian female here for vulvar recheck after being seen on 06/12/2021.  Findings consistent with lichen sclerosus.  Topical clobetasol 0.05% ointment bid started.  Pt reports she did travel the last month and didn't use it for about 10 days.  Still, the itching is much better.  There is just a small area that is still bothering her but even that is better.  Denies vaginal bleeding or discharge.  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   Former smoker 06/13/2021   Rotator cuff strain 01/30/2021   Paroxysmal supraventricular tachycardia (H[facility]) 01/29/2020   Adult ADHD 03/14/2019   Severe obstructive sleep apnea 03/14/2019   Pelvic pain in female 11/24/2017   Hyperlipidemia, mixed 08/16/2017   Plantar fasciitis, right 07/07/2017   Ischial bursitis, right 11/26/2016   Primary osteoarthritis of right knee 11/26/2016   Obesity 01/30/2016   Anxiety and depression 12/05/2015   Treadmill stress test negative for angina pectoris 12/05/2015   Hypothyroidism 12/05/2015   Positive ANA (antinuclear antibody) 12/05/2015    Past Medical History:  Diagnosis Date   Lichen sclerosus et atrophicus    Thyroid disease    Hashimoto's thyroiditis    Past Surgical History:  Procedure Laterality Date   CHOLECYSTECTOMY  1996   TONSILLECTOMY      MEDS:   Current Outpatient Medications on File Prior to Visit  Medication Sig Dispense Refill   ALPRAZolam (NIRAVAM) 0.25 MG dissolvable tablet Take 1-2 tablets (0.25-0.5 mg total) by mouth daily as needed for anxiety. Prior to flying 10 tablet 0   buPROPion (WELLBUTRIN XL) 300 MG 24 hr tablet TAKE 1 TABLET BY MOUTH EVERY DAY 90 tablet 2   Calcium Carbonate-Vitamin D 600-400 MG-UNIT tablet Take 1 tablet by mouth 2 (two) times daily. 60 tablet 11   clobetasol ointment (TEMOVATE) 0.05 % Apply 1 application topically 2 (two) times daily. Apply  as directed twice daily.  After 1 month or when itching stops, decrease to nightly. 60 g 0   DULoxetine (CYMBALTA) 60 MG capsule TAKE 1 CAPSULE (60 MG TOTAL) BY MOUTH DAILY. DUE FOR FOLLOW UP. 90 capsule 1   levothyroxine (SYNTHROID) 150 MCG tablet TAKE 1 TABLET BY MOUTH EVERY DAY BEFORE BREAKFAST 90 tablet 3   meloxicam (MOBIC) 15 MG tablet One tab PO qAM with a meal for 2 weeks, then daily prn pain. 30 tablet 3   norethindrone (MICRONOR) 0.35 MG tablet Take 1 tablet (0.35 mg total) by mouth daily. 84 tablet 1   metoprolol tartrate (LOPRESSOR) 25 MG tablet Take 1 tablet (25 mg total) by mouth 2 (two) times daily as needed. MAY TAKE TWICE DAILY PRN (AS NEEDED) FOR PALPITATIONS 60 tablet 3   No current facility-administered medications on file prior to visit.    ALLERGIES: Patient has no known allergies.  Family History  Problem Relation Age of Onset   Alcohol abuse Father    Heart attack Father    Alcohol abuse Brother    Cancer Brother        testicular   Suicidality Brother    Alcohol abuse Paternal Uncle    Cancer Paternal Uncle        colon   Heart attack Maternal Grandmother     SH:  married, non smoker  Review of Systems  Constitutional: Negative.   Genitourinary:        Vulvar itching  PHYSICAL EXAMINATION:    BP 131/89 (BP Location: Right Arm, Patient Position: Sitting, Cuff Size: Large)   Pulse 60   Ht 5\' 9"  (1.753 m) Comment: reported  Wt (!) 319 lb 6.4 oz (144.9 kg)   LMP 07/09/2021   BMI 47.17 kg/m     Physical Exam Constitutional:      Appearance: Normal appearance. She is obese.  Genitourinary:    Labia:        Right: Lesion present.        Left: Lesion present.        Comments: No vulvar skin thickening and no ulcerations.  Tissue is much improved. Lymphadenopathy:     Lower Body: No right inguinal adenopathy. No left inguinal adenopathy.  Neurological:     General: No focal deficit present.     Mental Status: She is alert.  Psychiatric:         Mood and Affect: Mood normal.   Chaperone, 09/08/2021, CMA, was present for exam.  Assessment/Plan: 1. Vulvar itching - much improved despite not using for 10 days these last 6 weeks  2. Lichen sclerosus et atrophicus - risks of vulvar cancer associated with this finding discussed.  Importance of letting me know if has new itching or broken skin that does not responded to topical steroid.  Clobetasol used recommended nightly for 4 weeks and then transition to maintenance therapy.  Pt voiced clear understanding.   - mometasone (ELOCON) 0.1 % ointment; Apply topically twice weekly.  Dispense: 45 g; Refill: 3

## 2021-08-07 DIAGNOSIS — G4733 Obstructive sleep apnea (adult) (pediatric): Secondary | ICD-10-CM | POA: Diagnosis not present

## 2021-08-16 DIAGNOSIS — G4733 Obstructive sleep apnea (adult) (pediatric): Secondary | ICD-10-CM | POA: Diagnosis not present

## 2021-08-18 ENCOUNTER — Telehealth: Payer: 59 | Admitting: Physician Assistant

## 2021-08-18 DIAGNOSIS — U071 COVID-19: Secondary | ICD-10-CM | POA: Diagnosis not present

## 2021-08-18 MED ORDER — MOLNUPIRAVIR EUA 200MG CAPSULE: 4.0000 | ORAL_CAPSULE | Freq: Two times a day (BID) | ORAL | 0 refills | Status: AC

## 2021-08-18 MED ORDER — FLUTICASONE PROPIONATE 50 MCG/ACT NA SUSP: 2.0000 | Freq: Every day | NASAL | 0 refills | Status: DC

## 2021-08-18 MED ORDER — BENZONATATE 100 MG PO CAPS: 100.0000 mg | ORAL_CAPSULE | Freq: Three times a day (TID) | ORAL | 0 refills | Status: DC | PRN

## 2021-08-18 NOTE — Patient Instructions (Signed)
Hello Ann "Harrison",  You are being placed in the home monitoring program for COVID-19 (commonly known as Coronavirus).  This is because you are suspected to have the virus or are known to have the virus.  If you are unsure which group you fall into call your clinic.    As part of this program, you'll answer a daily questionnaire in the MyChart mobile app. You'll receive a notification through the MyChart app when the questionnaire is available. When you log in to MyChart, you'll see the tasks in your To Do activity.       Clinicians will see any answers that are concerning and take appropriate steps.  If at any point you are having a medical emergency, call 911.  If otherwise concerned call your clinic instead of coming into the clinic or hospital.  To keep from spreading the disease you should: Stay home and limit contact with other people as much as possible.  Wash your hands frequently. Cover your coughs and sneezes with a tissue, and throw used tissues in the trash.   Clean and disinfect frequently touched surfaces and objects.    Take care of yourself by: Staying home Resting Drinking fluids Take fever-reducing medications (Tylenol/Acetaminophen and Ibuprofen)  For more information on the disease go to the Centers for Disease Control and Prevention website     You are being prescribed MOLNUPIRAVIR for COVID-19 infection.   Please call the pharmacy or go through the drive through vs going inside if you are picking up the mediation yourself to prevent further spread. If prescribed to a [facility] affiliated pharmacy, a pharmacist will bring the medication out to your car.   ADMINISTRATION INSTRUCTIONS: Take with or without food. Swallow the tablets whole. Don't chew, crush, or break the medications because it might not work as well  For each dose of the medication, you should be taking FOUR tablets at one time, TWICE a day   Finish your full five-day course of Molnupiravir even  if you feel better before you're done. Stopping this medication too early can make it less effective to prevent severe illness related to COVID19.    Molnupiravir is prescribed for YOU ONLY. Don't share it with others, even if they have similar symptoms as you. This medication might not be right for everyone.   Make sure to take steps to protect yourself and others while you're taking this medication in order to get well soon and to prevent others from getting sick with COVID-19.   **If you are of childbearing potential (any gender) - it is advised to not get pregnant while taking this medication and recommended that condoms are used for female partners the next 3 months after taking the medication out of extreme caution    COMMON SIDE EFFECTS: Diarrhea Nausea  Dizziness    If your COVID-19 symptoms get worse, get medical help right away. Call 911 if you experience symptoms such as worsening cough, trouble breathing, chest pain that doesn't go away, confusion, a hard time staying awake, and pale or blue-colored skin. This medication won't prevent all COVID-19 cases from getting worse.   Can take to lessen severity: Vit C 500mg  twice daily Quercertin 250-500mg  twice daily Zinc 75-100mg  daily Melatonin 3-6 mg at bedtime Vit D3 1000-2000 IU daily Aspirin 81 mg daily with food Optional: Famotidine 20mg  daily Also can add tylenol/ibuprofen as needed for fevers and body aches May add Mucinex or Mucinex DM as needed for cough/congestion  10 Things You Can  Do to Manage Your COVID-19 Symptoms at Home If you have possible or confirmed COVID-19 Stay home except to get medical care. Monitor your symptoms carefully. If your symptoms get worse, call your healthcare provider immediately. Get rest and stay hydrated. If you have a medical appointment, call the healthcare provider ahead of time and tell them that you have or may have COVID-19. For medical emergencies, call 911 and notify the  dispatch personnel that you have or may have COVID-19. Cover your cough and sneezes with a tissue or use the inside of your elbow. Wash your hands often with soap and water for at least 20 seconds or clean your hands with an alcohol-based hand sanitizer that contains at least 60% alcohol. As much as possible, stay in a specific room and away from other people in your home. Also, you should use a separate bathroom, if available. If you need to be around other people in or outside of the home, wear a mask. Avoid sharing personal items with other people in your household, like dishes, towels, and bedding. Clean all surfaces that are touched often, like counters, tabletops, and doorknobs. Use household cleaning sprays or wipes according to the label instructions. [email or web address] 05/17/2020 This information is not intended to replace advice given to you by your health care provider. Make sure you discuss any questions you have with your health care provider. Document Revised: 03/06/2021 Document Reviewed: 03/06/2021 Elsevier Patient Education  [place].

## 2021-08-18 NOTE — Progress Notes (Signed)
Virtual Visit Consent   Ann Greene, you are scheduled for a virtual visit with a  provider today.     Just as with appointments in the office, your consent must be obtained to participate.  Your consent will be active for this visit and any virtual visit you may have with one of our providers in the next 365 days.     If you have a MyChart account, a copy of this consent can be sent to you electronically.  All virtual visits are billed to your insurance company just like a traditional visit in the office.    As this is a virtual visit, video technology does not allow for your provider to perform a traditional examination.  This may limit your provider's ability to fully assess your condition.  If your provider identifies any concerns that need to be evaluated in person or the need to arrange testing (such as labs, EKG, etc.), we will make arrangements to do so.     Although advances in technology are sophisticated, we cannot ensure that it will always work on either your end or our end.  If the connection with a video visit is poor, the visit may have to be switched to a telephone visit.  With either a video or telephone visit, we are not always able to ensure that we have a secure connection.     I need to obtain your verbal consent now.   Are you willing to proceed with your visit today?    Ann Greene has provided verbal consent on 08/18/2021 for a virtual visit (video or telephone).   Ann Loveless, PA-C   Date: 08/18/2021 11:43 AM   Virtual Visit via Video Note   I, Ann Greene, connected with  Ann Greene  ([id number], May 14, 1966) on 08/18/21 at 11:30 AM EDT by a video-enabled telemedicine application and verified that I am speaking with the correct person using two identifiers.  Location: Patient: Virtual Visit Location Patient: Home Provider: Virtual Visit Location Provider: Home Office   I discussed the limitations of evaluation and management by  telemedicine and the availability of in person appointments. The patient expressed understanding and agreed to proceed.    History of Present Illness: Ann Greene is a 55 y.o. who identifies as a female who was assigned female at birth, and is being seen today for Covid 93.  HPI: URI  This is a new problem. Episode onset: symptoms started on Thursday night, was in [place] at a meeting; Tested positive friday. The problem has been gradually worsening. Maximum temperature: unable to check. Associated symptoms include chest pain, congestion, coughing, headaches, a plugged ear sensation (left ear when landing with flight), sinus pain and a sore throat. Pertinent negatives include no ear pain. Associated symptoms comments: Chills, fatigue, myalgias, post nasal drainage. She has tried sleep and increased fluids (nyquil, dayquil, ibuprofen, throat lozenges) for the symptoms. The treatment provided mild relief.     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   Former smoker 06/13/2021   Rotator cuff strain 01/30/2021   Paroxysmal supraventricular tachycardia (HCC) 01/29/2020   Adult ADHD 03/14/2019   Severe obstructive sleep apnea 03/14/2019   Pelvic pain in female 11/24/2017   Hyperlipidemia, mixed 08/16/2017   Plantar fasciitis, right 07/07/2017   Ischial bursitis, right 11/26/2016   Primary osteoarthritis of right knee 11/26/2016   Obesity 01/30/2016  Anxiety and depression 12/05/2015   Treadmill stress test negative for angina pectoris 12/05/2015   Hypothyroidism 12/05/2015   Positive ANA (antinuclear antibody) 12/05/2015    Allergies: No Known Allergies Medications:  Current Outpatient Medications:    benzonatate (TESSALON) 100 MG capsule, Take 1 capsule (100 mg total) by mouth 3 (three) times daily as needed., Disp: 30 capsule, Rfl: 0   fluticasone (FLONASE) 50 MCG/ACT nasal spray, Place 2 sprays  into both nostrils daily., Disp: 16 g, Rfl: 0   molnupiravir EUA (LAGEVRIO) 200 mg CAPS capsule, Take 4 capsules (800 mg total) by mouth 2 (two) times daily for 5 days., Disp: 40 capsule, Rfl: 0   ALPRAZolam (NIRAVAM) 0.25 MG dissolvable tablet, Take 1-2 tablets (0.25-0.5 mg total) by mouth daily as needed for anxiety. Prior to flying, Disp: 10 tablet, Rfl: 0   buPROPion (WELLBUTRIN XL) 300 MG 24 hr tablet, TAKE 1 TABLET BY MOUTH EVERY DAY, Disp: 90 tablet, Rfl: 2   Calcium Carbonate-Vitamin D 600-400 MG-UNIT tablet, Take 1 tablet by mouth 2 (two) times daily., Disp: 60 tablet, Rfl: 11   clobetasol ointment (TEMOVATE) 0.05 %, Apply 1 application topically 2 (two) times daily. Apply as directed twice daily.  After 1 month or when itching stops, decrease to nightly., Disp: 60 g, Rfl: 0   DULoxetine (CYMBALTA) 60 MG capsule, TAKE 1 CAPSULE (60 MG TOTAL) BY MOUTH DAILY. DUE FOR FOLLOW UP., Disp: 90 capsule, Rfl: 1   levothyroxine (SYNTHROID) 150 MCG tablet, TAKE 1 TABLET BY MOUTH EVERY DAY BEFORE BREAKFAST, Disp: 90 tablet, Rfl: 3   meloxicam (MOBIC) 15 MG tablet, One tab PO qAM with a meal for 2 weeks, then daily prn pain., Disp: 30 tablet, Rfl: 3   metoprolol tartrate (LOPRESSOR) 25 MG tablet, Take 1 tablet (25 mg total) by mouth 2 (two) times daily as needed. MAY TAKE TWICE DAILY PRN (AS NEEDED) FOR PALPITATIONS, Disp: 60 tablet, Rfl: 3   mometasone (ELOCON) 0.1 % ointment, Apply topically twice weekly., Disp: 45 g, Rfl: 3   norethindrone (MICRONOR) 0.35 MG tablet, Take 1 tablet (0.35 mg total) by mouth daily., Disp: 84 tablet, Rfl: 1  Observations/Objective: Patient is well-developed, well-nourished in no acute distress.  Resting comfortably at home.  Head is normocephalic, atraumatic.  No labored breathing.  Speech is clear and coherent with logical content.  Patient is alert and oriented at baseline.    Assessment and Plan: 1. COVID-19 - molnupiravir EUA (LAGEVRIO) 200 mg CAPS capsule; Take  4 capsules (800 mg total) by mouth 2 (two) times daily for 5 days.  Dispense: 40 capsule; Refill: 0 - benzonatate (TESSALON) 100 MG capsule; Take 1 capsule (100 mg total) by mouth 3 (three) times daily as needed.  Dispense: 30 capsule; Refill: 0 - fluticasone (FLONASE) 50 MCG/ACT nasal spray; Place 2 sprays into both nostrils daily.  Dispense: 16 g; Refill: 0 - MyChart COVID-19 home monitoring program; Future  - Continue OTC symptomatic management of choice - Will send OTC vitamins and supplement information through AVS - Molnupiravir prescribed - Tessalon for cough, flonase for nasal congestion - Patient enrolled in MyChart symptom monitoring - Push fluids - Rest as needed - Discussed return precautions and when to seek in-person evaluation, sent via AVS as well  Follow Up Instructions: I discussed the assessment and treatment plan with the patient. The patient was provided an opportunity to ask questions and all were answered. The patient agreed with the plan and demonstrated an understanding of the instructions.  A  copy of instructions were sent to the patient via MyChart unless otherwise noted below.    The patient was advised to call back or seek an in-person evaluation if the symptoms worsen or if the condition fails to improve as anticipated.  Time:  I spent 11 minutes with the patient via telehealth technology discussing the above problems/concerns.    Ann Loveless, PA-C

## 2021-09-04 ENCOUNTER — Other Ambulatory Visit: Payer: Self-pay

## 2021-09-04 ENCOUNTER — Emergency Department (HOSPITAL_COMMUNITY)
Admission: EM | Admit: 2021-09-04 | Discharge: 2021-09-04 | Disposition: A | Discharge status: Home / Self Care | Payer: 59 | Attending: Emergency Medicine | Admitting: Emergency Medicine

## 2021-09-04 DIAGNOSIS — Z79899 Other long term (current) drug therapy: Secondary | ICD-10-CM | POA: Insufficient documentation

## 2021-09-04 DIAGNOSIS — R42 Dizziness and giddiness: Secondary | ICD-10-CM | POA: Diagnosis not present

## 2021-09-04 DIAGNOSIS — E039 Hypothyroidism, unspecified: Secondary | ICD-10-CM | POA: Insufficient documentation

## 2021-09-04 DIAGNOSIS — I471 Supraventricular tachycardia: Secondary | ICD-10-CM | POA: Insufficient documentation

## 2021-09-04 DIAGNOSIS — R0902 Hypoxemia: Secondary | ICD-10-CM | POA: Diagnosis not present

## 2021-09-04 DIAGNOSIS — R231 Pallor: Secondary | ICD-10-CM | POA: Diagnosis not present

## 2021-09-04 DIAGNOSIS — Z87891 Personal history of nicotine dependence: Secondary | ICD-10-CM | POA: Insufficient documentation

## 2021-09-04 DIAGNOSIS — R000 Tachycardia, unspecified: Secondary | ICD-10-CM | POA: Diagnosis not present

## 2021-09-04 LAB — BASIC METABOLIC PANEL
Anion gap: 10 (ref 5–15)
BUN: 12 mg/dL (ref 6–20)
CO2: 21 mmol/L — ABNORMAL LOW (ref 22–32)
Calcium: 9.2 mg/dL (ref 8.9–10.3)
Chloride: 105 mmol/L (ref 98–111)
Creatinine, Ser: 0.86 mg/dL (ref 0.44–1.00)
GFR, Estimated: >60 mL/min (ref >60)
Glucose, Bld: 84 mg/dL (ref 70–99)
Potassium: 4.1 mmol/L (ref 3.5–5.1)
Sodium: 136 mmol/L (ref 135–145)

## 2021-09-04 LAB — CBC WITH DIFFERENTIAL/PLATELET
Abs Immature Granulocytes: 0.06 10*3/uL (ref 0.00–0.07)
Basophils Absolute: 0.1 10*3/uL (ref 0.0–0.1)
Basophils Relative: 1 %
Eosinophils Absolute: 0.2 10*3/uL (ref 0.0–0.5)
Eosinophils Relative: 1 %
HCT: 41.2 % (ref 36.0–46.0)
Hemoglobin: 13.2 g/dL (ref 12.0–15.0)
Immature Granulocytes: 1 %
Lymphocytes Relative: 15 %
Lymphs Abs: 1.7 10*3/uL (ref 0.7–4.0)
MCH: 27.6 pg (ref 26.0–34.0)
MCHC: 32 g/dL (ref 30.0–36.0)
MCV: 86 fL (ref 80.0–100.0)
Monocytes Absolute: 0.7 10*3/uL (ref 0.1–1.0)
Monocytes Relative: 7 %
Neutro Abs: 8.2 10*3/uL — ABNORMAL HIGH (ref 1.7–7.7)
Neutrophils Relative %: 75 %
Platelets: 405 10*3/uL — ABNORMAL HIGH (ref 150–400)
RBC: 4.79 MIL/uL (ref 3.87–5.11)
RDW: 13.9 % (ref 11.5–15.5)
WBC: 10.9 10*3/uL — ABNORMAL HIGH (ref 4.0–10.5)
nRBC: 0 % (ref 0.0–0.2)

## 2021-09-04 LAB — MAGNESIUM: Magnesium: 1.9 mg/dL (ref 1.7–2.4)

## 2021-09-04 NOTE — ED Provider Notes (Signed)
[facility] EMERGENCY DEPARTMENT Provider Note   CSN: [id number] Arrival date & time: 09/04/21  1202     History Chief Complaint  Patient presents with   Palpitations    Ann Greene is a 55 y.o. female.  The history is provided by the patient and medical records. No language interpreter was used.    55 year old female with hx of PSVT, anxiety, depression, thyroid disease brought here via EMS for evaluation of fast heart rate.  Patient report around 9 AM this morning while she was blow drying her hair while standing she developed heart palpitation.  She put on her apple watch and noted that her heart rate was in the 200s.  At that time she felt a bit shortness of breath, felt nauseous.  She then took her metoprolol but noticed no improvement.  She felt increased nausea, had an episode of vomit and having some diarrhea.  She is try a vagal maneuver without relief.  EMS was contacted and initially they did try a vagal maneuver but it did not help.  Once patient was in the EMS truck, they perform another vagal maneuver and heart rate did improved.  At this time she endorsed some mild headache and fatigue but otherwise no chest pain shortness of breath abdominal pain back pain.  She did had COVID approximately 2 and half weeks ago.  She denies any change in her medications specifically her thyroid medication.  She denies increasing in caffeine use.  She was prescribed metoprolol to use as needed, but did start taking it on a regular basis for several months but then she resumed back to taking it only as needed.   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   Former smoker 06/13/2021   Rotator cuff strain 01/30/2021   Paroxysmal supraventricular tachycardia (HCC) 01/29/2020    Adult ADHD 03/14/2019   Severe obstructive sleep apnea 03/14/2019   Pelvic pain in female 11/24/2017   Hyperlipidemia, mixed 08/16/2017   Plantar fasciitis, right 07/07/2017   Ischial bursitis, right 11/26/2016   Primary osteoarthritis of right knee 11/26/2016   Obesity 01/30/2016   Anxiety and depression 12/05/2015   Treadmill stress test negative for angina pectoris 12/05/2015   Hypothyroidism 12/05/2015   Positive ANA (antinuclear antibody) 12/05/2015    Past Surgical History:  Procedure Laterality Date   CHOLECYSTECTOMY  1996   TONSILLECTOMY       OB History     Gravida  0   Para  0   Term  0   Preterm  0   AB  0   Living  0      SAB  0   IAB  0   Ectopic  0   Multiple  0   Live Births  0           Family History  Problem Relation Age of Onset   Alcohol abuse Father    Heart attack Father    Alcohol abuse Brother    Cancer Brother        testicular   Suicidality Brother    Alcohol abuse Paternal Uncle    Cancer Paternal Uncle        colon   Heart attack Maternal Grandmother  Social History   Tobacco Use   Smoking status: Former    Packs/day: 0.50    Types: Cigarettes    Start date: 12/03/2020    Quit date: 07/09/2021    Years since quitting: 0.1   Smokeless tobacco: Never  Substance Use Topics   Alcohol use: Yes    Alcohol/week: 0.0 standard drinks    Comment: occasional   Drug use: No    Home Medications Prior to Admission medications   Medication Sig Start Date End Date Taking? Authorizing Provider  ALPRAZolam (NIRAVAM) 0.25 MG dissolvable tablet Take 1-2 tablets (0.25-0.5 mg total) by mouth daily as needed for anxiety. Prior to flying 07/29/21   Monica Becton, MD  benzonatate (TESSALON) 100 MG capsule Take 1 capsule (100 mg total) by mouth 3 (three) times daily as needed. 08/18/21   [name], PA-C  buPROPion (WELLBUTRIN XL) 300 MG 24 hr tablet TAKE 1 TABLET BY MOUTH EVERY DAY 07/05/21   Monica Becton, MD  Calcium Carbonate-Vitamin D 600-400 MG-UNIT tablet Take 1 tablet by mouth 2 (two) times daily. 07/29/21   Monica Becton, MD  clobetasol ointment (TEMOVATE) 0.05 % Apply 1 application topically 2 (two) times daily. Apply as directed twice daily.  After 1 month or when itching stops, decrease to nightly. 06/12/21   Jerene Bears, MD  DULoxetine (CYMBALTA) 60 MG capsule TAKE 1 CAPSULE (60 MG TOTAL) BY MOUTH DAILY. DUE FOR FOLLOW UP. 07/16/21   Monica Becton, MD  fluticasone (FLONASE) 50 MCG/ACT nasal spray Place 2 sprays into both nostrils daily. 08/18/21   [name], PA-C  levothyroxine (SYNTHROID) 150 MCG tablet TAKE 1 TABLET BY MOUTH EVERY DAY BEFORE BREAKFAST 02/17/21   Monica Becton, MD  meloxicam (MOBIC) 15 MG tablet One tab PO qAM with a meal for 2 weeks, then daily prn pain. 05/21/21   Monica Becton, MD  metoprolol tartrate (LOPRESSOR) 25 MG tablet Take 1 tablet (25 mg total) by mouth 2 (two) times daily as needed. MAY TAKE TWICE DAILY PRN (AS NEEDED) FOR PALPITATIONS 09/16/20 12/15/20  [name], MD  mometasone (ELOCON) 0.1 % ointment Apply topically twice weekly. 07/30/21   Jerene Bears, MD  norethindrone (MICRONOR) 0.35 MG tablet Take 1 tablet (0.35 mg total) by mouth daily. 06/12/21   Jerene Bears, MD    Allergies    Patient has no known allergies.  Review of Systems   Review of Systems  All other systems reviewed and are negative.  Physical Exam Updated Vital Signs BP 125/82   Pulse 68   Temp 97.9 F (36.6 C) (Oral)   Resp 16   SpO2 93%   Physical Exam Vitals and nursing note reviewed.  Constitutional:      General: She is not in acute distress.    Appearance: She is well-developed. She is obese.  HENT:     Head: Atraumatic.  Eyes:     Conjunctiva/sclera: Conjunctivae normal.  Cardiovascular:     Rate and Rhythm: Normal rate and regular rhythm.     Pulses: Normal pulses.     Heart sounds: Normal heart  sounds.  Pulmonary:     Effort: Pulmonary effort is normal.  Abdominal:     Palpations: Abdomen is soft.     Tenderness: There is no abdominal tenderness.  Musculoskeletal:     Cervical back: Neck supple.     Right lower leg: No edema.     Left lower leg: No  edema.  Skin:    Findings: No rash.  Neurological:     Mental Status: She is alert. Mental status is at baseline.  Psychiatric:        Mood and Affect: Mood normal.    ED Results / Procedures / Treatments   Labs (all labs ordered are listed, but only abnormal results are displayed) Labs Reviewed  BASIC METABOLIC PANEL - Abnormal; Notable for the following components:      Result Value   CO2 21 (*)    All other components within normal limits  CBC WITH DIFFERENTIAL/PLATELET - Abnormal; Notable for the following components:   WBC 10.9 (*)    Platelets 405 (*)    Neutro Abs 8.2 (*)    All other components within normal limits  MAGNESIUM    EKG None  Date: 09/04/2021  Rate: 95  Rhythm: normal sinus rhythm  QRS Axis: normal  Intervals: normal  ST/T Wave abnormalities: normal  Conduction Disutrbances: none  Narrative Interpretation:   Old EKG Reviewed: No significant changes noted    Radiology No results found.  Procedures Procedures   Medications Ordered in ED Medications - No data to display  ED Course  I have reviewed the triage vital signs and the nursing notes.  Pertinent labs & imaging results that were available during my care of the patient were reviewed by me and considered in my medical decision making (see chart for details).    MDM Rules/Calculators/A&P                           BP 125/82   Pulse 68   Temp 97.9 F (36.6 C) (Oral)   Resp 16   SpO2 93%   Final Clinical Impression(s) / ED Diagnoses Final diagnoses:  SVT (supraventricular tachycardia) (HCC)    Rx / DC Orders ED Discharge Orders     None      12:36 PM Patient with history of paroxysmal SVT who takes  metoprolol as needed present today with heart palpitation with heart rates in the low 200s.  She did take her metoprolol afterward, and when she was brought here via EMS, her heart rate normalized after a vagal maneuver.  At this time she endorsed some mild generalized fatigue and mild headache but otherwise felt much better.  No provocation noted.  EKG unremarkable.  Will monitor closely and will check basic labs.  1:48 PM Labs are reassuring.  Patient is resting comfortably, heart rate normalized, at this time she is stable for discharge home.  Encourage patient to continue taking metoprolol daily and follow-up closely with her cardiologist for outpatient management.  Return precaution given.   Fayrene Helper, PA-C 09/04/21 1348    Sloan Leiter, DO 09/04/21 1745

## 2021-09-04 NOTE — Discharge Instructions (Signed)
Please resume taking your Lopressor 25 mg tablet daily, and call or follow-up closely with cardiologist for further managements of your condition.  Return if your symptoms persist or if you have any concern

## 2021-09-04 NOTE — ED Notes (Signed)
Patient was given ice chips. 

## 2021-09-04 NOTE — ED Triage Notes (Signed)
Pt arrived by EMS complaining of N/V/D and dizziness Pt states that while blowing drying her hair her heart started to race and she started to feeling sick  Pt states that her apple watch said her HR was 209, on EMS arrival pt HR 185.   EMS had pt bear down and HR converted to 100 systolic

## 2021-09-05 ENCOUNTER — Telehealth: Payer: Self-pay | Admitting: General Practice

## 2021-09-05 ENCOUNTER — Telehealth: Payer: Self-pay | Admitting: Internal Medicine

## 2021-09-05 MED ORDER — METOPROLOL TARTRATE 25 MG PO TABS: 25.0000 mg | ORAL_TABLET | Freq: Two times a day (BID) | ORAL | 3 refills | Status: DC | PRN

## 2021-09-05 NOTE — Telephone Encounter (Signed)
Transition Care Management Follow-up Telephone Call Date of discharge and from where: 09/04/21 from [facility] How have you been since you were released from the hospital? Feeling tired and sore. She is doing ok otherwise. Any questions or concerns? No  Items Reviewed: Did the pt receive and understand the discharge instructions provided? Yes  Medications obtained and verified? Yes  Other? No  Any new allergies since your discharge? No  Dietary orders reviewed? Yes Do you have support at home? Yes   Home Care and Equipment/Supplies: Were home health services ordered? no   Functional Questionnaire: (I = Independent and D = Dependent) ADLs: I  Bathing/Dressing- I  Meal Prep- I  Eating- I  Maintaining continence- I  Transferring/Ambulation- I  Managing Meds- I  Follow up appointments reviewed:  PCP Hospital f/u appt confirmed?  She will call back to schedule.   Specialist Hospital f/u appt confirmed? No  Patient has a cardiologist and she is going to call the office and make an appointment. Are transportation arrangements needed? No  If their condition worsens, is the pt aware to call PCP or go to the Emergency Dept.? Yes Was the patient provided with contact information for the PCP's office or ED? Yes Was to pt encouraged to call back with questions or concerns? Yes

## 2021-09-05 NOTE — Telephone Encounter (Signed)
*  STAT* If patient is at the pharmacy, call can be transferred to refill team.   1. Which medications need to be refilled? (please list name of each medication and dose if known) metoprolol tartrate (LOPRESSOR) 25 MG tablet (Expired)  2. Which pharmacy/location (including street and city if local pharmacy) is medication to be sent to? [id number] [place], [place] - [address]  3. Do they need a 30 day or 90 day supply?  90 day supply   Only has two tablets left.

## 2021-09-05 NOTE — Telephone Encounter (Signed)
Lopressor 25 mg was sent to pts pharmacy. Spoke with pt and she is aware that RX was sent to her pharmacy.

## 2021-09-07 DIAGNOSIS — G4733 Obstructive sleep apnea (adult) (pediatric): Secondary | ICD-10-CM | POA: Diagnosis not present

## 2021-09-09 ENCOUNTER — Encounter: Payer: Self-pay | Admitting: Internal Medicine

## 2021-09-09 ENCOUNTER — Ambulatory Visit: Payer: 59 | Admitting: Internal Medicine

## 2021-09-09 ENCOUNTER — Other Ambulatory Visit: Payer: Self-pay

## 2021-09-09 VITALS — BP 132/82 | HR 75 | Resp 20 | Ht 69.0 in | Wt 319.0 lb

## 2021-09-09 DIAGNOSIS — Z9989 Dependence on other enabling machines and devices: Secondary | ICD-10-CM

## 2021-09-09 DIAGNOSIS — I471 Supraventricular tachycardia: Secondary | ICD-10-CM

## 2021-09-09 DIAGNOSIS — E782 Mixed hyperlipidemia: Secondary | ICD-10-CM

## 2021-09-09 DIAGNOSIS — R002 Palpitations: Secondary | ICD-10-CM | POA: Diagnosis not present

## 2021-09-09 DIAGNOSIS — G4733 Obstructive sleep apnea (adult) (pediatric): Secondary | ICD-10-CM

## 2021-09-09 MED ORDER — METOPROLOL TARTRATE 25 MG PO TABS: 25.0000 mg | ORAL_TABLET | Freq: Two times a day (BID) | ORAL | 3 refills | Status: DC

## 2021-09-09 NOTE — Progress Notes (Signed)
Cardiology Office Note:    Date:  09/09/2021   ID:  Ann Greene, DOB 08-Oct-1966, MRN [id number]  PCP:  Monica Becton, MD  Cardiologist:  None  Electrophysiologist:  None   Referring MD: Monica Becton,*   Chief Complaint/Reason for Referral: Follow-up palpitations and daytime fatigue   History of Present Illness:    Ann Greene is a 55 y.o. female with a history of anxiety and panic disorder, hypothyroidism with a history of Hashimoto's disease, positive ANA, osteoarthritis of the right knee, hyperlipidemia, adult ADHD previously on Vyvanse, and excessive daytime sleepiness who presents for follow-up of palpitations noted to be symptomatic SVT on cardiac monitor.   Recently went to ER with SVT while blow drying her hair, after testing positive for COVID  few weeks prior. She tried the PRN doses of metoprolol and vagal maneuvers but was unable to break rhythm initially. She has otherwise not had significant palpitations or episodes of SVT since the ER visit on 09/04/21.  Using CPAP.   We discussed obtaining an echo for episode of arrhythmia after COVID, to rule out pericardial or other structural issues after viral illness. Also discussed possible a calcium score for further risk stratification of HLD.   The patient denies chest pain, chest pressure, dyspnea at rest or with exertion, PND, orthopnea, or leg swelling. Denies cough, fever, chills. Denies nausea, vomiting. Denies syncope or presyncope. Denies dizziness or lightheadedness.     Past Medical History:  Diagnosis Date   Lichen sclerosus et atrophicus    Thyroid disease    Hashimoto's thyroiditis    Past Surgical History:  Procedure Laterality Date   CHOLECYSTECTOMY  1996   TONSILLECTOMY      Current Medications: Current Meds  Medication Sig   ALPRAZolam (NIRAVAM) 0.25 MG dissolvable tablet Take 1-2 tablets (0.25-0.5 mg total) by mouth daily as needed for anxiety. Prior to flying   buPROPion (WELLBUTRIN  XL) 300 MG 24 hr tablet TAKE 1 TABLET BY MOUTH EVERY DAY   Calcium Carbonate-Vitamin D 600-400 MG-UNIT tablet Take 1 tablet by mouth 2 (two) times daily.   clobetasol ointment (TEMOVATE) 0.05 % Apply 1 application topically 2 (two) times daily. Apply as directed twice daily.  After 1 month or when itching stops, decrease to nightly.   DULoxetine (CYMBALTA) 60 MG capsule TAKE 1 CAPSULE (60 MG TOTAL) BY MOUTH DAILY. DUE FOR FOLLOW UP.   fluticasone (FLONASE) 50 MCG/ACT nasal spray Place 2 sprays into both nostrils daily.   levothyroxine (SYNTHROID) 150 MCG tablet TAKE 1 TABLET BY MOUTH EVERY DAY BEFORE BREAKFAST   meloxicam (MOBIC) 15 MG tablet One tab PO qAM with a meal for 2 weeks, then daily prn pain.   metoprolol tartrate (LOPRESSOR) 25 MG tablet Take 1 tablet (25 mg total) by mouth 2 (two) times daily as needed. MAY TAKE TWICE DAILY PRN (AS NEEDED) FOR PALPITATIONS   mometasone (ELOCON) 0.1 % ointment Apply topically twice weekly.   norethindrone (MICRONOR) 0.35 MG tablet Take 1 tablet (0.35 mg total) by mouth daily.     Allergies:   Patient has no known allergies.   Social History   Tobacco Use   Smoking status: Former    Packs/day: 0.50    Types: Cigarettes    Start date: 12/03/2020    Quit date: 07/09/2021    Years since quitting: 0.1   Smokeless tobacco: Never  Substance Use Topics   Alcohol use: Yes    Alcohol/week: 0.0 standard drinks    Comment: occasional  Drug use: No     Family History: The patient's family history includes Alcohol abuse in her brother, father, and paternal uncle; Cancer in her brother and paternal uncle; Heart attack in her father and maternal grandmother; Suicidality in her brother.  ROS:   Please see the history of present illness.    All other systems reviewed and are negative.  EKGs/Labs/Other Studies Reviewed:    The following studies were reviewed today:  Echo 03/11/2020: 1. Left ventricular ejection fraction, by estimation, is 60 to 65%.  The  left ventricle has normal function. The left ventricle has no regional  wall motion abnormalities. There is moderate concentric left ventricular  hypertrophy. Left ventricular  diastolic parameters were normal.   2. Right ventricular systolic function is normal. The right ventricular  size is normal. There is normal pulmonary artery systolic pressure. The  estimated right ventricular systolic pressure is 24.7 mmHg.   3. The mitral valve is normal in structure. No evidence of mitral valve  regurgitation. No evidence of mitral stenosis.   4. The aortic valve is normal in structure. Aortic valve regurgitation is  not visualized. No aortic stenosis is present.   5. The inferior vena cava is normal in size with greater than 50%  respiratory variability, suggesting right atrial pressure of 3 mmHg.   EKG:  09/09/21: Sinus bradycardia, rate 57 03/21/2021: NSR, rate 72, nonspecific T wave abnormality 09/16/2020: NSR, 81 bpm.   Recent Labs: 09/04/2021: BUN 12; Creatinine, Ser 0.86; Hemoglobin 13.2; Magnesium 1.9; Platelets 405; Potassium 4.1; Sodium 136  Recent Lipid Panel    Component Value Date/Time   CHOL 199 09/16/2020 0912   TRIG 106 09/16/2020 0912   HDL 51 09/16/2020 0912   CHOLHDL 3.9 09/16/2020 0912   CHOLHDL 4.0 03/16/2019 0842   VLDL 17 12/19/2015 0810   LDLCALC 129 (H) 09/16/2020 0912   LDLCALC 133 (H) 03/16/2019 0842    Physical Exam:    VS:  BP 132/82 (BP Location: Left Arm, Patient Position: Sitting, Cuff Size: Normal)   Pulse 75   Resp 20   Ht 5\' 9"  (1.753 m)   Wt (!) 319 lb (144.7 kg)   SpO2 95%   BMI 47.11 kg/m     Wt Readings from Last 5 Encounters:  09/09/21 (!) 319 lb (144.7 kg)  07/30/21 (!) 319 lb 6.4 oz (144.9 kg)  07/29/21 (!) 317 lb (143.8 kg)  06/13/21 (!) 315 lb 14.4 oz (143.3 kg)  06/12/21 (!) 316 lb 12.8 oz (143.7 kg)    Constitutional: No acute distress Eyes: sclera non-icteric, normal conjunctiva and lids ENMT: normal dentition, moist  mucous membranes Cardiovascular: regular rhythm, normal rate, no murmurs. S1 and S2 normal. Radial pulses normal bilaterally. No jugular venous distention.  Respiratory: clear to auscultation bilaterally GI : normal bowel sounds, soft and nontender. No distention.   MSK: extremities warm, well perfused. No edema.  NEURO: grossly nonfocal exam, moves all extremities. PSYCH: alert and oriented x 3, normal mood and affect.   ASSESSMENT:    1. SVT (supraventricular tachycardia) (HCC)   2. Palpitations   3. Hyperlipidemia, mixed   4. OSA on CPAP     PLAN:    SVT (supraventricular tachycardia) (HCC) - Plan: EKG 12-Lead, ECHO Palpitations -She has had 1 prolonged episode of SVT in the setting of postviral status having had COVID shortly before.  This is the most likely etiology for the SVT episode.  We will check an echocardiogram to ensure no worrisome structural issues  after COVID that could have contributed to arrhythmia, she did have a normal echo in 2021.  We also discussed taking metoprolol daily in the short-term at least to control symptoms of SVT while she continues to recover from COVID.  Hyperlipidemia, mixed - Plan: Lipid panel - last lipid panel not optimized, LDL 129, relatively unchanged from prior. Will recheck lipids and consider medication therapy if no change. ASCVD risk score is 2% in 10 years so we can discuss further lifestyle modification vs medication therapy per patient preference. Can also consider calcium scoring.  We plan to check another lipid panel in 6 months and allow her to continue to adjust her diet.  We will consider calcium score at that time.  Daytime somnolence Snoring - severe OSA, now on CPAP nightly.   CV risk reduction - reviewed diet and lifestyle today. Mediterranean diet recommended, or plant based diet.   Total time of encounter: 30 minutes total time of encounter, including 20 minutes spent in face-to-face patient care on the date of this  encounter. This time includes coordination of care and counseling regarding above mentioned problem list. Remainder of non-face-to-face time involved reviewing chart documents/testing relevant to the patient encounter and documentation in the medical record. I have independently reviewed documentation from referring provider.   Weston Brass, MD [place]  [facility]    Medication Adjustments/Labs and Tests Ordered: Current medicines are reviewed at length with the patient today.  Concerns regarding medicines are outlined above.   Orders Placed This Encounter  Procedures   Lipid panel   EKG 12-Lead   ECHOCARDIOGRAM COMPLETE     Meds ordered this encounter  Medications   metoprolol tartrate (LOPRESSOR) 25 MG tablet    Sig: Take 1 tablet (25 mg total) by mouth 2 (two) times daily.    Dispense:  180 tablet    Refill:  3     Patient Instructions  Medication Instructions:  Your physician recommends that you continue on your current medications as directed. Please refer to the Current Medication list given to you today.  *If you need a refill on your cardiac medications before your next appointment, please call your pharmacy*   Lab Work: Please return for FASTING labs in 6 months (prior to appointment with Dr. Federico Flake panel  Our in office lab hours are Monday-Friday 8:00-4:00, closed for lunch 12:45-1:45 pm.  No appointment needed.  Testing/Procedures: Your physician has requested that you have an echocardiogram. Echocardiography is a painless test that uses sound waves to create images of your heart. It provides your doctor with information about the size and shape of your heart and how well your heart's chambers and valves are working. This procedure takes approximately one hour. There are no restrictions for this procedure.  This will be done at our [facility] location:  [address]  Follow-Up: At [facility], you and your health needs  are our priority.  As part of our continuing mission to provide you with exceptional heart care, we have created designated Provider Care Teams.  These Care Teams include your primary Cardiologist (physician) and Advanced Practice Providers (APPs -  Physician Assistants and Nurse Practitioners) who all work together to provide you with the care you need, when you need it.  We recommend signing up for the patient portal called "MyChart".  Sign up information is provided on this After Visit Summary.  MyChart is used to connect with patients for Virtual Visits (Telemedicine).  Patients are able  to view lab/test results, encounter notes, upcoming appointments, etc.  Non-urgent messages can be sent to your provider as well.   To learn more about what you can do with MyChart, go to [email or web address].    Your next appointment:   6 month(s)  The format for your next appointment:   In Person  Provider:   Dr. Jacques Navy

## 2021-09-09 NOTE — Patient Instructions (Signed)
Medication Instructions:  Your physician recommends that you continue on your current medications as directed. Please refer to the Current Medication list given to you today.  *If you need a refill on your cardiac medications before your next appointment, please call your pharmacy*   Lab Work: Please return for FASTING labs in 6 months (prior to appointment with Dr. Federico Flake panel  Our in office lab hours are Monday-Friday 8:00-4:00, closed for lunch 12:45-1:45 pm.  No appointment needed.  Testing/Procedures: Your physician has requested that you have an echocardiogram. Echocardiography is a painless test that uses sound waves to create images of your heart. It provides your doctor with information about the size and shape of your heart and how well your heart's chambers and valves are working. This procedure takes approximately one hour. There are no restrictions for this procedure.  This will be done at our [facility] location:  [address]  Follow-Up: At [facility], you and your health needs are our priority.  As part of our continuing mission to provide you with exceptional heart care, we have created designated Provider Care Teams.  These Care Teams include your primary Cardiologist (physician) and Advanced Practice Providers (APPs -  Physician Assistants and Nurse Practitioners) who all work together to provide you with the care you need, when you need it.  We recommend signing up for the patient portal called "MyChart".  Sign up information is provided on this After Visit Summary.  MyChart is used to connect with patients for Virtual Visits (Telemedicine).  Patients are able to view lab/test results, encounter notes, upcoming appointments, etc.  Non-urgent messages can be sent to your provider as well.   To learn more about what you can do with MyChart, go to [email or web address].    Your next appointment:   6 month(s)  The format for your next  appointment:   In Person  Provider:   Dr. Jacques Navy

## 2021-09-10 ENCOUNTER — Ambulatory Visit (HOSPITAL_COMMUNITY): Payer: 59 | Attending: Cardiology

## 2021-09-10 DIAGNOSIS — I471 Supraventricular tachycardia: Secondary | ICD-10-CM | POA: Diagnosis not present

## 2021-09-10 LAB — ECHOCARDIOGRAM COMPLETE
Area-P 1/2: 3.17 cm2
S' Lateral: 3.7 cm

## 2021-09-11 ENCOUNTER — Other Ambulatory Visit: Payer: Self-pay | Admitting: Physician Assistant

## 2021-09-11 DIAGNOSIS — U071 COVID-19: Secondary | ICD-10-CM

## 2021-09-16 DIAGNOSIS — G4733 Obstructive sleep apnea (adult) (pediatric): Secondary | ICD-10-CM | POA: Diagnosis not present

## 2021-10-07 DIAGNOSIS — G4733 Obstructive sleep apnea (adult) (pediatric): Secondary | ICD-10-CM | POA: Diagnosis not present

## 2021-10-16 DIAGNOSIS — G4733 Obstructive sleep apnea (adult) (pediatric): Secondary | ICD-10-CM | POA: Diagnosis not present

## 2021-11-12 ENCOUNTER — Other Ambulatory Visit: Payer: Self-pay | Admitting: Sports Medicine

## 2021-11-12 DIAGNOSIS — F411 Generalized anxiety disorder: Secondary | ICD-10-CM

## 2021-11-16 DIAGNOSIS — G4733 Obstructive sleep apnea (adult) (pediatric): Secondary | ICD-10-CM | POA: Diagnosis not present

## 2021-11-23 ENCOUNTER — Other Ambulatory Visit (HOSPITAL_BASED_OUTPATIENT_CLINIC_OR_DEPARTMENT_OTHER): Payer: Self-pay | Admitting: Obstetrics & Gynecology

## 2021-11-23 DIAGNOSIS — N943 Premenstrual tension syndrome: Secondary | ICD-10-CM

## 2021-12-03 DIAGNOSIS — G4733 Obstructive sleep apnea (adult) (pediatric): Secondary | ICD-10-CM | POA: Diagnosis not present

## 2021-12-31 DIAGNOSIS — G4733 Obstructive sleep apnea (adult) (pediatric): Secondary | ICD-10-CM | POA: Diagnosis not present

## 2022-01-31 DIAGNOSIS — G4733 Obstructive sleep apnea (adult) (pediatric): Secondary | ICD-10-CM | POA: Diagnosis not present

## 2022-02-08 ENCOUNTER — Other Ambulatory Visit: Payer: Self-pay | Admitting: Sports Medicine

## 2022-02-08 DIAGNOSIS — F411 Generalized anxiety disorder: Secondary | ICD-10-CM

## 2022-02-08 DIAGNOSIS — E034 Atrophy of thyroid (acquired): Secondary | ICD-10-CM

## 2022-02-27 ENCOUNTER — Ambulatory Visit (INDEPENDENT_AMBULATORY_CARE_PROVIDER_SITE_OTHER): Payer: 59

## 2022-02-27 ENCOUNTER — Ambulatory Visit: Payer: 59 | Admitting: Sports Medicine

## 2022-02-27 DIAGNOSIS — M1711 Unilateral primary osteoarthritis, right knee: Secondary | ICD-10-CM | POA: Diagnosis not present

## 2022-02-27 DIAGNOSIS — E034 Atrophy of thyroid (acquired): Secondary | ICD-10-CM

## 2022-02-27 DIAGNOSIS — Z0000 Encounter for general adult medical examination without abnormal findings: Secondary | ICD-10-CM

## 2022-02-27 DIAGNOSIS — E782 Mixed hyperlipidemia: Secondary | ICD-10-CM | POA: Diagnosis not present

## 2022-02-27 DIAGNOSIS — N62 Hypertrophy of breast: Secondary | ICD-10-CM | POA: Diagnosis not present

## 2022-02-27 MED ORDER — LEVOTHYROXINE SODIUM 150 MCG PO TABS: 150.0000 ug | ORAL_TABLET | Freq: Every day | ORAL | 11 refills | Status: DC

## 2022-02-27 NOTE — Progress Notes (Signed)
? ? ?  Procedures performed today:   ? ?Procedure: Real-time Ultrasound Guided injection of the right knee ?Device: Samsung HS60  ?Verbal informed consent obtained.  ?Time-out conducted.  ?Noted no overlying erythema, induration, or other signs of local infection.  ?Skin prepped in a sterile fashion.  ?Local anesthesia: Topical Ethyl chloride.  ?With sterile technique and under real time ultrasound guidance: Mild effusion noted, 1 cc Kenalog 40, 2 cc lidocaine, 2 cc bupivacaine injected easily ?Completed without difficulty  ?Advised to call if fevers/chills, erythema, induration, drainage, or persistent bleeding.  ?Images permanently stored and available for review in PACS.  ?Impression: Technically successful ultrasound guided injection. ? ?Independent interpretation of notes and tests performed by another provider:  ? ?None. ? ?Brief History, Exam, Impression, and Recommendations:   ? ?Primary osteoarthritis of right knee ?Known osteoarthritis on x-rays, persistent medial joint line pain, no mechanical symptoms, oral NSAIDs and analgesics ineffective, injected knee today. ? ?Hypothyroidism ?Refilling medication, rechecking labs. ? ?Annual physical exam ?Due for Shingrix, we will likely address this at a follow-up visit ? ?Macromastia ?With all the associated symptoms, referral to plastic surgery for reduction mammoplasty. ? ? ? ?___________________________________________ ?Gwen Her. Dianah Field, M.D., ABFM., CAQSM. ?Primary Care and Sports Medicine ?[place] ? ?Adjunct Instructor of Family Medicine  ?[employer] ?

## 2022-02-27 NOTE — Assessment & Plan Note (Signed)
With all the associated symptoms, referral to plastic surgery for reduction mammoplasty. ?

## 2022-02-27 NOTE — Assessment & Plan Note (Signed)
Due for Shingrix, we will likely address this at a follow-up visit ?

## 2022-02-27 NOTE — Assessment & Plan Note (Signed)
Known osteoarthritis on x-rays, persistent medial joint line pain, no mechanical symptoms, oral NSAIDs and analgesics ineffective, injected knee today. ?

## 2022-02-27 NOTE — Assessment & Plan Note (Signed)
Refilling medication, rechecking labs. ?

## 2022-02-28 LAB — CBC
HCT: 42.4 % (ref 35.0–45.0)
Hemoglobin: 13.7 g/dL (ref 11.7–15.5)
MCH: 27.6 pg (ref 27.0–33.0)
MCHC: 32.3 g/dL (ref 32.0–36.0)
MCV: 85.5 fL (ref 80.0–100.0)
MPV: 9.4 fL (ref 7.5–12.5)
Platelets: 441 10*3/uL — ABNORMAL HIGH (ref 140–400)
RBC: 4.96 10*6/uL (ref 3.80–5.10)
RDW: 13.3 % (ref 11.0–15.0)
WBC: 9.8 10*3/uL (ref 3.8–10.8)

## 2022-02-28 LAB — COMPREHENSIVE METABOLIC PANEL
AG Ratio: 1.7 (calc) (ref 1.0–2.5)
ALT: 15 U/L (ref 6–29)
AST: 14 U/L (ref 10–35)
Albumin: 4.4 g/dL (ref 3.6–5.1)
Alkaline phosphatase (APISO): 64 U/L (ref 37–153)
BUN: 12 mg/dL (ref 7–25)
CO2: 27 mmol/L (ref 20–32)
Calcium: 9.8 mg/dL (ref 8.6–10.4)
Chloride: 105 mmol/L (ref 98–110)
Creat: 0.82 mg/dL (ref 0.50–1.03)
Globulin: 2.6 g/dL (calc) (ref 1.9–3.7)
Glucose, Bld: 128 mg/dL — ABNORMAL HIGH (ref 65–99)
Potassium: 5.1 mmol/L (ref 3.5–5.3)
Sodium: 140 mmol/L (ref 135–146)
Total Bilirubin: 0.3 mg/dL (ref 0.2–1.2)
Total Protein: 7 g/dL (ref 6.1–8.1)

## 2022-02-28 LAB — LIPID PANEL
Cholesterol: 163 mg/dL (ref <200)
HDL: 47 mg/dL — ABNORMAL LOW (ref >=50)
LDL Cholesterol (Calc): 100 mg/dL (calc) — ABNORMAL HIGH
Non-HDL Cholesterol (Calc): 116 mg/dL (calc) (ref <130)
Total CHOL/HDL Ratio: 3.5 (calc) (ref <5.0)
Triglycerides: 71 mg/dL (ref <150)

## 2022-02-28 LAB — HEMOGLOBIN A1C
Hgb A1c MFr Bld: 5.7 % of total Hgb — ABNORMAL HIGH (ref <5.7)
Mean Plasma Glucose: 117 mg/dL
eAG (mmol/L): 6.5 mmol/L

## 2022-02-28 LAB — TSH: TSH: 2.04 mIU/L

## 2022-03-03 ENCOUNTER — Other Ambulatory Visit: Payer: Self-pay | Admitting: Sports Medicine

## 2022-03-03 DIAGNOSIS — F411 Generalized anxiety disorder: Secondary | ICD-10-CM

## 2022-03-16 ENCOUNTER — Ambulatory Visit: Payer: 59 | Admitting: Plastic Surgery

## 2022-03-16 ENCOUNTER — Encounter: Payer: Self-pay | Admitting: Plastic Surgery

## 2022-03-16 VITALS — BP 129/76 | HR 70 | Ht 69.0 in | Wt 322.8 lb

## 2022-03-16 DIAGNOSIS — G8929 Other chronic pain: Secondary | ICD-10-CM

## 2022-03-16 DIAGNOSIS — M546 Pain in thoracic spine: Secondary | ICD-10-CM | POA: Diagnosis not present

## 2022-03-16 DIAGNOSIS — N62 Hypertrophy of breast: Secondary | ICD-10-CM

## 2022-03-16 DIAGNOSIS — M542 Cervicalgia: Secondary | ICD-10-CM

## 2022-03-17 NOTE — Progress Notes (Signed)
? ?Referring Provider ?Monica Becton, MD ?[phone] ?[address] ?[place],  [place] [address]  ? ?CC:  ?Breast hypertrophy ? ?Ann Greene is an 56 y.o. female.  ?HPI:  ?The patient is a 56 year old female with breast hypertrophy.  She wants an aggressive reduction.  She says she would like to be a D cup or smaller. ? ?The patient has extremely large breasts causing symptoms that include the following: ?Back pain in the upper and lower back, including neck pain. She pulls or pins her bra straps to provide better lift and relief of the pressure and pain. She notices relief by holding her breast up manually.  Her shoulder straps cause grooves and pain and pressure that requires padding for relief. Pain medication is sometimes required with motrin and tylenol.  Activities that are hindered by enlarged breasts include: exercise and running.  She has tried supportive clothing as well as fitted bras without improvement.     Mammogram history: 2023 normal.  Family history of breast cancer: None.  Tobacco use: Former smoker none currently.   The patient expresses the desire to pursue surgical intervention. ? ?The BMI = 47.  Preoperative bra size = H cup.  ? ?No Known Allergies ? ?Outpatient Encounter Medications as of 03/16/2022  ?Medication Sig  ? ALPRAZolam (NIRAVAM) 0.25 MG dissolvable tablet Take 1-2 tablets (0.25-0.5 mg total) by mouth daily as needed for anxiety. Prior to flying  ? buPROPion (WELLBUTRIN XL) 300 MG 24 hr tablet TAKE 1 TABLET BY MOUTH EVERY DAY  ? Calcium Carbonate-Vitamin D 600-400 MG-UNIT tablet Take 1 tablet by mouth 2 (two) times daily.  ? clobetasol ointment (TEMOVATE) 0.05 % Apply 1 application topically 2 (two) times daily. Apply as directed twice daily.  After 1 month or when itching stops, decrease to nightly.  ? DULoxetine (CYMBALTA) 60 MG capsule TAKE 1 CAPSULE (60 MG TOTAL) BY MOUTH DAILY. DUE FOR FOLLOW UP.  ? fluticasone (FLONASE) 50 MCG/ACT nasal spray Place 2 sprays into both  nostrils daily.  ? levothyroxine (SYNTHROID) 150 MCG tablet Take 1 tablet (150 mcg total) by mouth daily before breakfast.  ? meloxicam (MOBIC) 15 MG tablet One tab PO qAM with a meal for 2 weeks, then daily prn pain.  ? metoprolol tartrate (LOPRESSOR) 25 MG tablet Take 1 tablet (25 mg total) by mouth 2 (two) times daily.  ? mometasone (ELOCON) 0.1 % ointment Apply topically twice weekly.  ? norethindrone (MICRONOR) 0.35 MG tablet TAKE 1 TABLET BY MOUTH EVERY DAY  ? ?No facility-administered encounter medications on file as of 03/16/2022.  ?  ? ?Past Medical History:  ?Diagnosis Date  ? Lichen sclerosus et atrophicus   ? Thyroid disease   ? Hashimoto's thyroiditis  ? ? ?Past Surgical History:  ?Procedure Laterality Date  ? CHOLECYSTECTOMY  1996  ? TONSILLECTOMY    ? ? ?Family History  ?Problem Relation Age of Onset  ? Alcohol abuse Father   ? Heart attack Father   ? Alcohol abuse Brother   ? Cancer Brother   ?     testicular  ? Suicidality Brother   ? Alcohol abuse Paternal Uncle   ? Cancer Paternal Uncle   ?     colon  ? Heart attack Maternal Grandmother   ? ? ?Social History  ? ?Social History Narrative  ? Not on file  ?  ? ?Review of Systems ?General: Denies fevers, chills, weight loss ?CV: Denies chest pain, shortness of breath, palpitations ? ? ?Physical  Exam ? ?  03/16/2022  ?  8:43 AM 02/27/2022  ?  9:35 AM 09/09/2021  ?  8:27 AM  ?Vitals with BMI  ?Height 5\' 9"  5' 8.5" 5\' 9"   ?Weight 322 lbs 13 oz 323 lbs 2 oz 319 lbs  ?BMI 47.65 48.41 47.09  ?Systolic 129 141  ?Diastolic 76 84 82  ?Pulse 70 69 75  ?  ?General:  No acute distress,  Alert and oriented, Non-Toxic, Normal speech and affect ?Breast: No easily palpable breast masses on physical exam, significant breast ptosis and macromastia. ?Her breasts are extremely large and fairly symmetric with the left possibly little larger..  She has hyperpigmentation of the inframammary area on both sides.  The sternal to nipple distance on the right is 41 cm and the  left is 42 cm.  The IMF distance is 14 cm on the right and 14 cm on the left.  Base width is 24 bilaterally. ?Assessment/Plan ?The patient has bilateral symptomatic macromastia.  She is a good candidate for a breast reduction.  She is interested in pursuing surgical treatment.  She has tried supportive garments and fitted bras with no relief.  The details of breast reduction surgery were discussed.  I explained the procedure in detail along the with the expected scars.  The risks were discussed in detail and include bleeding, infection, damage to surrounding structures, need for additional procedures, nipple loss, change in nipple sensation, persistent pain, contour irregularities and asymmetries.  I explained that breast feeding is often not possible after breast reduction surgery.  We discussed the expected postoperative course with an overall recovery period of about 1 month.  She demonstrated full understanding of all risks.  We discussed her personal risk factors that include high BMI.  The patient is interested in pursuing surgical treatment. ? ?The estimated excess breast tissue to be removed at the time of surgery = greater than 1000 grams on the left and greater than 1000 grams on the right. ? ?03/17/2022, 9:52 AM  ? ? ? ? ?  ? ? ? ? ?

## 2022-03-23 ENCOUNTER — Telehealth: Payer: Self-pay | Admitting: Internal Medicine

## 2022-03-23 DIAGNOSIS — E782 Mixed hyperlipidemia: Secondary | ICD-10-CM

## 2022-03-23 NOTE — Telephone Encounter (Signed)
[name], MD  02/28/2022 10:36 AM EDT     LDL improved but may want to consider optimizing further (LDL goal <70) - at this point I would consider CT calcium scoring. Please offer to patient to schedule (remind of $99 out of pocket fee) and then follow up with me after otherwise schedule her 6 mo follow up with me and we can discuss it then before scheduling.    Spoke with patient regarding the following results. Patient made aware and patient verbalized understanding.   Order placed for calcium score, follow up scheduled.   Advised patient to call back to office with any issues, questions, or concerns. Patient verbalized understanding.

## 2022-03-23 NOTE — Telephone Encounter (Signed)
Patient was returning call. Please advise ?

## 2022-03-24 DIAGNOSIS — G4733 Obstructive sleep apnea (adult) (pediatric): Secondary | ICD-10-CM | POA: Diagnosis not present

## 2022-04-09 ENCOUNTER — Ambulatory Visit (HOSPITAL_BASED_OUTPATIENT_CLINIC_OR_DEPARTMENT_OTHER): Payer: 59 | Attending: Plastic Surgery | Admitting: Physical Therapy

## 2022-04-09 ENCOUNTER — Encounter (HOSPITAL_BASED_OUTPATIENT_CLINIC_OR_DEPARTMENT_OTHER): Payer: Self-pay | Admitting: Physical Therapy

## 2022-04-09 DIAGNOSIS — M6281 Muscle weakness (generalized): Secondary | ICD-10-CM | POA: Insufficient documentation

## 2022-04-09 DIAGNOSIS — G8929 Other chronic pain: Secondary | ICD-10-CM | POA: Diagnosis not present

## 2022-04-09 DIAGNOSIS — M5459 Other low back pain: Secondary | ICD-10-CM | POA: Insufficient documentation

## 2022-04-09 DIAGNOSIS — N62 Hypertrophy of breast: Secondary | ICD-10-CM | POA: Diagnosis not present

## 2022-04-09 DIAGNOSIS — M546 Pain in thoracic spine: Secondary | ICD-10-CM | POA: Insufficient documentation

## 2022-04-09 NOTE — Therapy (Signed)
OUTPATIENT PHYSICAL THERAPY THORACOLUMBAR EVALUATION   Patient Name: Coti Manderfeld MRN: [id number] DOB:[birth year], 56 y.o., female Today's Date: 04/09/2022   PT End of Session - 04/09/22 0809     Visit Number 1    Number of Visits 15    Date for PT Re-Evaluation 07/08/22    Authorization Type Aetna    PT Start Time 0801    PT Stop Time 0845    PT Time Calculation (min) 44 min    Activity Tolerance Patient tolerated treatment well;No increased pain    Behavior During Therapy WFL for tasks assessed/performed             Past Medical History:  Diagnosis Date   Lichen sclerosus et atrophicus    Thyroid disease    Hashimoto's thyroiditis   Past Surgical History:  Procedure Laterality Date   CHOLECYSTECTOMY  1996   TONSILLECTOMY     Patient Active Problem List   Diagnosis Date Noted   Macromastia [id number]   Lichen sclerosus et atrophicus 07/31/2021   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   Former smoker 06/13/2021   Rotator cuff strain 01/30/2021   Paroxysmal supraventricular tachycardia ([place]) 01/29/2020   Adult ADHD 03/14/2019   Severe obstructive sleep apnea 03/14/2019   Pelvic pain in female 11/24/2017   Hyperlipidemia, mixed 08/16/2017   Plantar fasciitis, right 07/07/2017   Ischial bursitis, right 11/26/2016   Primary osteoarthritis of right knee 11/26/2016   Obesity 01/30/2016   Anxiety and depression 12/05/2015   Treadmill stress test negative for angina pectoris 12/05/2015   Hypothyroidism 12/05/2015   Annual physical exam 12/05/2015   Positive ANA (antinuclear antibody) 12/05/2015    PCP: Silverio Decamp, MD  REFERRING PROVIDER: Lennice Sites, MD  REFERRING DIAG:  N62 (ICD-10-CM) - Macromastia  M54.6,G89.29 (ICD-10-CM) - Chronic bilateral thoracic back pain    Rationale for Evaluation and Treatment Rehabilitation  THERAPY DIAG:  Pain in thoracic spine  Muscle weakness (generalized)  Other low back pain  ONSET DATE: 5+ years  SUBJECTIVE:                                                                                                                                                                                            SUBJECTIVE STATEMENT:  Pt states this is chronic issue that has been going on for a long. She has neck and shoulder tightness. There is pain into the R T/S behind the R bra line. It feels like a pinching when she is reaching, sitting a computer, or over to bend over to tie shoes. She also has LBP. She is here pre-procedure for a breast reduction. She  has been on her feet a lot recently which has increased the pain in her back. Pt does have NT into arms and hands that occurs very often when she is on her phone in any position. Happens quicker than it used to. No NT into the legs. Pt denies focal weakness into her hands or dropping items. Pt denies cancer red flags.   Upper back pinching pain comes and goes. She will sometimes get a week or two of relief. Has membership here at [employer].   PERTINENT HISTORY:  Vertigo, tinnitus, depression/anxiety  PAIN:  Are you having pain? Yes: NPRS scale: 5/10 Pain location: L/S and T/S Pain description: pinching in upper back, ache into neck shoulders and low back Aggravating factors: bending, lifting, standing too long, working  Relieving factors: PPT in standing, heat, ice, massage   PRECAUTIONS: None  WEIGHT BEARING RESTRICTIONS No  FALLS:  Has patient fallen in last 6 months? 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;   Hobbies: walking/hiking;   PLOF: Independent  PATIENT GOALS :    OBJECTIVE:   DIAGNOSTIC FINDINGS: N/A   PATIENT SURVEYS:  FOTO 46 54 @ DC 6 MCII  SCREENING FOR RED FLAGS: Bowel or bladder incontinence: No Spinal tumors: No Cauda equina syndrome: No Compression fracture: No Abdominal aneurysm: No  COGNITION:  Overall cognitive status: Within  functional limits for tasks assessed     SENSATION: WFL   POSTURE: rounded shoulders, increased lumbar lordosis, and increased thoracic kyphosis  PALPATION: TTP of ~T5-8 region of paraspinals TTP bilat lumbar paraspinals  Joint stiffness into ext and rotation within T3-10 and L1-5  T/S ROM:   Active  A/PROM  eval  Flexion WFL p!  Extension WFL  Right lateral flexion WFL p! Into L/S  Left lateral flexion WFL  Right rotation 75% p!  Left rotation 75% pulling on L   (Blank rows = not tested)   LOWER EXTREMITY ROM:  LE motion with WFL for tasks assessed; does appear to have hip combined flexion ER position stiffness with seated cross/uncrossing  UE ROM WFL with OH movements and BHB reaching  UE ROM 4/5 throughout bilaterally in flexion, ABD, IR/ER, and scapular retractors  LUMBAR SPECIAL TESTS:  Straight leg raise test: Negative, Slump test: Negative, Trendelenburg sign: Positive, and step/up down Positive  GAIT: Distance walked: 24ft Assistive device utilized: None Level of assistance: Complete Independence Comments: toe out, increased lumbar lordosis    TODAY'S TREATMENT  Exercises - Seated Thoracic Lumbar Extension  - 4-5 x daily - 7 x weekly - 1 sets - 10 reps - 3 hold - Shoulder External Rotation and Scapular Retraction with Resistance  - 4-5 x daily - 7 x weekly - 1 sets - 10 reps - Theracane Over Shoulder  - 1 x daily - 7 x weekly - 3 sets - 10 reps  Self tennis ball massage   PATIENT EDUCATION:  Education details: MOI, diagnosis, prognosis, anatomy, exercise progression, DOMS expectations, muscle firing,  envelope of function, HEP, POC  Person educated: Patient Education method: Explanation, Demonstration, Tactile cues, Verbal cues, and Handouts- provided HEP via email/text Education comprehension: verbalized understanding, returned demonstration, verbal cues required, and tactile cues required   HOME EXERCISE PROGRAM: Access Code: [email or web address] URL:  [email or web address] Date: 04/09/2022 Prepared by: Daleen Bo  ASSESSMENT:  CLINICAL IMPRESSION: Patient is a 56 y.o. female who was seen today for physical therapy evaluation and treatment for c/c  of upper back pain. Pt also has concurrent L/S pain. Pt's s/s appear consistent with mechanical LBP and T/S pain due to joint stiffness and muscle spasm. Pt's body habitus likely causing postural shift and fwd COG which is increased posterior chain demand. 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 Plan to provide lumbar mobility and strength HEP at next session. T/S pain HEP provided today. Pt is largely stiffness and strength limited. Pt would benefit from continued skilled therapy in order to reach goals and maximize functional thoracic strength and ROM for full return to PLOF.    OBJECTIVE IMPAIRMENTS Abnormal gait, decreased activity tolerance, difficulty walking, decreased ROM, decreased strength, hypomobility, increased muscle spasms, impaired flexibility, impaired sensation, impaired UE functional use, improper body mechanics, postural dysfunction, obesity, and pain.   ACTIVITY LIMITATIONS carrying, lifting, sitting, standing, sleeping, and reach over head  PARTICIPATION LIMITATIONS: cleaning, laundry, driving, shopping, community activity, occupation, and exercise  PERSONAL FACTORS Age, Fitness, and 1-2 comorbidities:    are also affecting patient's functional outcome.   REHAB POTENTIAL: Good  CLINICAL DECISION MAKING: Stable/uncomplicated  EVALUATION COMPLEXITY: Low   GOALS:   SHORT TERM GOALS: Target date: 05/21/2022  Pt will become independent with HEP in order to demonstrate synthesis of PT education.   Goal status: INITIAL  2.  Pt will report at least 2 pt reduction on NPRS scale for pain in order to demonstrate functional improvement with household activity, self care, and ADL.   Goal status: INITIAL  3.  Pt will be able to  demonstrate/report ability to sit/stand/sleep for extended periods of time without pain in order to demonstrate functional improvement and tolerance to static positioning.   Goal status: INITIAL    LONG TERM GOALS: Target date: 07/02/2022  Pt  will become independent with final HEP in order to demonstrate synthesis of PT education.  Goal status: INITIAL  2.  Pt will score >/= 54 on FOTO to demonstrate improvement in perceived thoracic function. .   Goal status: INITIAL  3.  Pt will be able to demonstrate full available T/S and L/S motion without pain in order to demonstrate functional improvement in spinal mobility for self-care and house hold duties.   Goal status: INITIAL  4.  Pt will be able to report ability to return to normal ADL and hiking without T/S or LBP pain in order to demonstrate functional improvement in UE/LE function for return to exercise and fitness.  Goal status: INITIAL   PLAN: PT FREQUENCY: 1-2x/week  PT DURATION: 12 weeks (likely d/c in 8 wks)  PLANNED INTERVENTIONS: Therapeutic exercises, Therapeutic activity, Neuromuscular re-education, Balance training, Gait training, Patient/Family education, Joint manipulation, Joint mobilization, Orthotic/Fit training, Aquatic Therapy, Dry Needling, Electrical stimulation, Spinal manipulation, Spinal mobilization, Cryotherapy, Moist heat, scar mobilization, Taping, Vasopneumatic device, Traction, Ultrasound, Biofeedback, Ionotophoresis 4mg /ml Dexamethasone, Manual therapy, and Re-evaluation.  PLAN FOR NEXT SESSION: manual to T/S, seated rowing, cat/cow, T/S open book, wall angel   Daleen Bo, PT 04/09/2022, 9:57 AM

## 2022-04-10 ENCOUNTER — Telehealth: Payer: Self-pay | Admitting: Sports Medicine

## 2022-04-10 ENCOUNTER — Ambulatory Visit (INDEPENDENT_AMBULATORY_CARE_PROVIDER_SITE_OTHER): Payer: 59 | Admitting: Sports Medicine

## 2022-04-10 DIAGNOSIS — M170 Bilateral primary osteoarthritis of knee: Secondary | ICD-10-CM | POA: Diagnosis not present

## 2022-04-10 NOTE — Telephone Encounter (Signed)
[employer] approval please, x-ray confirmed osteoarthritis, both knees.  Failed greater than 6 weeks of conservative treatment.

## 2022-04-10 NOTE — Progress Notes (Signed)
    Procedures performed today:    None.  Independent interpretation of notes and tests performed by another provider:   None.  Brief History, Exam, Impression, and Recommendations:    Primary osteoarthritis of both knees Pleasant 56 a female, known bilateral knee osteoarthritis, medial joint line pain, has not responded to oral NSAIDs, analgesics, we did an injection at the last visit, she had only partial relief, proceeding with approval for [employer].    ___________________________________________ [name]. [name], M.D., ABFM., CAQSM. Primary Care and Sports Medicine [facility]  Adjunct Instructor of Family Medicine  [employer]

## 2022-04-10 NOTE — Telephone Encounter (Signed)
[employer] paperwork faxed to [employer] at [phone] Request is for Orthovisc Pt's insurance prefers Orth[employer] Fax confirmation receipt received  

## 2022-04-10 NOTE — Assessment & Plan Note (Signed)
Pleasant 20 a female, known bilateral knee osteoarthritis, medial joint line pain, has not responded to oral NSAIDs, analgesics, we did an injection at the last visit, she had only partial relief, proceeding with approval for [employer].

## 2022-04-15 ENCOUNTER — Encounter (HOSPITAL_BASED_OUTPATIENT_CLINIC_OR_DEPARTMENT_OTHER): Payer: Self-pay | Admitting: Physical Therapy

## 2022-04-15 ENCOUNTER — Ambulatory Visit (HOSPITAL_BASED_OUTPATIENT_CLINIC_OR_DEPARTMENT_OTHER): Payer: 59 | Admitting: Physical Therapy

## 2022-04-15 DIAGNOSIS — M546 Pain in thoracic spine: Secondary | ICD-10-CM | POA: Diagnosis not present

## 2022-04-15 DIAGNOSIS — G8929 Other chronic pain: Secondary | ICD-10-CM | POA: Diagnosis not present

## 2022-04-15 DIAGNOSIS — M6281 Muscle weakness (generalized): Secondary | ICD-10-CM | POA: Diagnosis not present

## 2022-04-15 DIAGNOSIS — N62 Hypertrophy of breast: Secondary | ICD-10-CM | POA: Diagnosis not present

## 2022-04-15 DIAGNOSIS — M5459 Other low back pain: Secondary | ICD-10-CM | POA: Diagnosis not present

## 2022-04-15 NOTE — Therapy (Signed)
OUTPATIENT PHYSICAL THERAPY THORACOLUMBAR TREATMENT  Patient Name: Alta Dewilde MRN: [id number] DOB:10-02-1966, 56 y.o., female Today's Date: 04/15/2022   PT End of Session - 04/15/22 1412     Visit Number 2    Number of Visits 15    Date for PT Re-Evaluation 07/08/22    Authorization Type Aetna    PT Start Time 1334    PT Stop Time 1404   limited by pain   PT Time Calculation (min) 30 min    Activity Tolerance Patient tolerated treatment well    Behavior During Therapy [facility] for tasks assessed/performed             Past Medical History:  Diagnosis Date   Lichen sclerosus et atrophicus    Thyroid disease    Hashimoto's thyroiditis   Past Surgical History:  Procedure Laterality Date   [place]     Patient Active Problem List   Diagnosis Date Noted   Macromastia [id number]   Lichen sclerosus et atrophicus 07/31/2021   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   Former smoker 06/13/2021   Rotator cuff strain 01/30/2021   Paroxysmal supraventricular tachycardia ([place]) 01/29/2020   Adult ADHD 03/14/2019   Severe obstructive sleep apnea 03/14/2019   Pelvic pain in female 11/24/2017   Hyperlipidemia, mixed 08/16/2017   Plantar fasciitis, right 07/07/2017   Ischial bursitis, right 11/26/2016   Primary osteoarthritis of both knees 11/26/2016   Obesity 01/30/2016   Anxiety and depression 12/05/2015   Treadmill stress test negative for angina pectoris 12/05/2015   Hypothyroidism 12/05/2015   Annual physical exam 12/05/2015   Positive ANA (antinuclear antibody) 12/05/2015    PCP: Silverio Decamp, MD  REFERRING PROVIDER: Lennice Sites, MD  REFERRING DIAG:  N62 (ICD-10-CM) - Macromastia  M54.6,G89.29 (ICD-10-CM) - Chronic bilateral thoracic back pain    Rationale for Evaluation and Treatment Rehabilitation  THERAPY DIAG:  Pain in thoracic spine  Muscle weakness (generalized)  Other low back pain  ONSET DATE: 5+ years  SUBJECTIVE:                                                                                                                                                                                            SUBJECTIVE STATEMENT:  Pt arrives today with moderately increased pain in a focal area in the R thoracic spine, under bra strap. Pt states pain has been more moderate to severe over the past several days with no specific cause. Pt says exercises and stretching have helped since last visit but she has backed off since exacerbation of pain. Pain radiates along side and to anterior torso;  pt says sometimes feels as though it is "catching" and she has a sharp pain in side that causes shortness of breath on inhalation; worsened with behind the back movements of R shoulder.   PERTINENT HISTORY:  Vertigo, tinnitus, depression/anxiety  PAIN:  Are you having pain? Yes: NPRS scale: 5/10 Pain location: L/S and T/S Pain description: pinching in upper back, ache into neck shoulders and low back Aggravating factors: bending, lifting, standing too long, working  Relieving factors: PPT in standing, heat, ice, massage   PRECAUTIONS: None  WEIGHT BEARING RESTRICTIONS No  FALLS:  Has patient fallen in last 6 months? 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;   Hobbies: walking/hiking;   PLOF: Independent  PATIENT GOALS :    OBJECTIVE:   DIAGNOSTIC FINDINGS: N/A   PATIENT SURVEYS:  FOTO 46 54 @ DC 6 MCII  SCREENING FOR RED FLAGS: Bowel or bladder incontinence: No Spinal tumors: No Cauda equina syndrome: No Compression fracture: No Abdominal aneurysm: No  COGNITION:  Overall cognitive status: Within functional limits for tasks assessed     SENSATION: WFL   POSTURE: rounded shoulders, increased lumbar lordosis, and increased thoracic kyphosis  PALPATION: TTP of ~T5-8 region of paraspinals TTP bilat lumbar  paraspinals  Joint stiffness into ext and rotation within T3-10 and L1-5  T/S ROM:   Active  A/PROM  eval  Flexion WFL p!  Extension WFL  Right lateral flexion WFL p! Into L/S  Left lateral flexion WFL  Right rotation 75% p!  Left rotation 75% pulling on L   (Blank rows = not tested)   LOWER EXTREMITY ROM:  LE motion with WFL for tasks assessed; does appear to have hip combined flexion ER position stiffness with seated cross/uncrossing  UE ROM WFL with OH movements and BHB reaching  UE ROM 4/5 throughout bilaterally in flexion, ABD, IR/ER, and scapular retractors  LUMBAR SPECIAL TESTS:  Straight leg raise test: Negative, Slump test: Negative, Trendelenburg sign: Positive, and step/up down Positive  GAIT: Distance walked: 74ft Assistive device utilized: None Level of assistance: Complete Independence Comments: toe out, increased lumbar lordosis    TODAY'S TREATMENT   6/14 -Soft tissue mobilization to R thoracic paraspinals, T7-9 -Grade I/II CPA mobs, T7-9 -Forward flexion stretch, red ball, 3x10 -Flexion/side bending stretch, red ball, 2x 10 -Thoracic rotation wall stretch - 2x10   Exercises - Seated Thoracic Lumbar Extension  - 4-5 x daily - 7 x weekly - 1 sets - 10 reps - 3 hold - Shoulder External Rotation and Scapular Retraction with Resistance  - 4-5 x daily - 7 x weekly - 1 sets - 10 reps - Theracane Over Shoulder  - 1 x daily - 7 x weekly - 3 sets - 10 reps  Self tennis ball massage   PATIENT EDUCATION:  Education details: MOI, diagnosis, prognosis, anatomy, exercise progression, DOMS expectations, muscle firing,  envelope of function, HEP, POC  Person educated: Patient Education method: Explanation, Demonstration, Tactile cues, Verbal cues, and Handouts- provided HEP via email/text Education comprehension: verbalized understanding, returned demonstration, verbal cues required, and tactile cues required   HOME EXERCISE PROGRAM: Access Code:  [email or web address] URL: [email or web address] Date: 04/09/2022 Prepared by: Daleen Bo  ASSESSMENT:  CLINICAL IMPRESSION:  Patient was seen today for treatment with a c/c of thoracic pain. Pt reported her pain was most severe over R paraspinals at approx T8 level; pt was tender to palpation in these areas. Pt  also reports symptoms consistent with decreased rib mobility at the concordant level. Pt was given education on spine mobility, self-massage, and stretching. Pt encouraged to follow up with PCP if pain and/or shortness of breath are persistent or worsens. Pt will continue to benefit from skilled therapy to maximize thoracic ROM, strength, and stability to return pt to full function.  OBJECTIVE IMPAIRMENTS Abnormal gait, decreased activity tolerance, difficulty walking, decreased ROM, decreased strength, hypomobility, increased muscle spasms, impaired flexibility, impaired sensation, impaired UE functional use, improper body mechanics, postural dysfunction, obesity, and pain.   ACTIVITY LIMITATIONS carrying, lifting, sitting, standing, sleeping, and reach over head  PARTICIPATION LIMITATIONS: cleaning, laundry, driving, shopping, community activity, occupation, and exercise  PERSONAL FACTORS Age, Fitness, and 1-2 comorbidities:    are also affecting patient's functional outcome.   REHAB POTENTIAL: Good  CLINICAL DECISION MAKING: Stable/uncomplicated  EVALUATION COMPLEXITY: Low   GOALS:   SHORT TERM GOALS: Target date: 05/21/2022  Pt will become independent with HEP in order to demonstrate synthesis of PT education.   Goal status: INITIAL  2.  Pt will report at least 2 pt reduction on NPRS scale for pain in order to demonstrate functional improvement with household activity, self care, and ADL.   Goal status: INITIAL  3.  Pt will be able to demonstrate/report ability to sit/stand/sleep for extended periods of time without pain in order to demonstrate functional improvement  and tolerance to static positioning.   Goal status: INITIAL    LONG TERM GOALS: Target date: 07/02/2022  Pt  will become independent with final HEP in order to demonstrate synthesis of PT education.  Goal status: INITIAL  2.  Pt will score >/= 54 on FOTO to demonstrate improvement in perceived thoracic function. .   Goal status: INITIAL  3.  Pt will be able to demonstrate full available T/S and L/S motion without pain in order to demonstrate functional improvement in spinal mobility for self-care and house hold duties.   Goal status: INITIAL  4.  Pt will be able to report ability to return to normal ADL and hiking without T/S or LBP pain in order to demonstrate functional improvement in UE/LE function for return to exercise and fitness.  Goal status: INITIAL   PLAN: PT FREQUENCY: 1-2x/week  PT DURATION: 12 weeks (likely d/c in 8 wks)  PLANNED INTERVENTIONS: Therapeutic exercises, Therapeutic activity, Neuromuscular re-education, Balance training, Gait training, Patient/Family education, Joint manipulation, Joint mobilization, Orthotic/Fit training, Aquatic Therapy, Dry Needling, Electrical stimulation, Spinal manipulation, Spinal mobilization, Cryotherapy, Moist heat, scar mobilization, Taping, Vasopneumatic device, Traction, Ultrasound, Biofeedback, Ionotophoresis 4mg /ml Dexamethasone, Manual therapy, and Re-evaluation.  PLAN FOR NEXT SESSION: manual to T/S, seated rowing, cat/cow, T/S open book, wall angel   Carney Living, PT 04/15/2022, 3:51 PM

## 2022-04-16 NOTE — Telephone Encounter (Signed)
Benefits Investigation Details received from [employer] Injection: Orthovisc  Medical: Deductible does not apply. Once the OOP has been met, patient is covered at 100%. Only one copay applies per date of service.  PA required: Yes through [facility]  PA form faxed to [facility] at [phone]  Fax confirmation received  Pharmacy: Product is not covered under the pharmacy plan.   Specialty Pharmacy: [facility]  May fill through: [place] Copay/Coinsurance:  Product Copay: $60 Administration Coinsurance:  Administration Copay: $60 Deductible: $1250 (met: $448.91) Out of Pocket Max: $2500 (met: $531.82)    Waiting on PA response.

## 2022-04-23 ENCOUNTER — Ambulatory Visit (HOSPITAL_BASED_OUTPATIENT_CLINIC_OR_DEPARTMENT_OTHER): Payer: 59 | Admitting: Physical Therapy

## 2022-04-23 ENCOUNTER — Encounter (HOSPITAL_BASED_OUTPATIENT_CLINIC_OR_DEPARTMENT_OTHER): Payer: Self-pay | Admitting: Physical Therapy

## 2022-04-23 DIAGNOSIS — M546 Pain in thoracic spine: Secondary | ICD-10-CM | POA: Diagnosis not present

## 2022-04-23 DIAGNOSIS — M5459 Other low back pain: Secondary | ICD-10-CM

## 2022-04-23 DIAGNOSIS — M6281 Muscle weakness (generalized): Secondary | ICD-10-CM | POA: Diagnosis not present

## 2022-04-23 DIAGNOSIS — N62 Hypertrophy of breast: Secondary | ICD-10-CM | POA: Diagnosis not present

## 2022-04-23 DIAGNOSIS — G8929 Other chronic pain: Secondary | ICD-10-CM | POA: Diagnosis not present

## 2022-04-23 NOTE — Therapy (Signed)
OUTPATIENT PHYSICAL THERAPY THORACOLUMBAR TREATMENT  Patient Name: Ann Greene MRN: [id number] DOB:1966-02-10, 56 y.o., female Today's Date: 04/23/2022   PT End of Session - 04/23/22 1556     Visit Number 3    Number of Visits 15    Date for PT Re-Evaluation 07/08/22    Authorization Type Aet[employer]    PT Start Time 1515    PT Stop Time 1555    PT Time Calculation (min) 40 min    Activity Tolerance Patient tolerated treatment well    Behavior During Therapy [facility] for tasks assessed/performed              Past Medical History:  Diagnosis Date   Lichen sclerosus et atrophicus    Thyroid disease    Hashimoto's thyroiditis   Past Surgical History:  Procedure Laterality Date   CHOLECYSTECTOMY  1996   TONSILLECTOMY     Patient Active Problem List   Diagnosis Date Noted   Macromastia [id number]   Lichen sclerosus et atrophicus 07/31/2021   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   Former smoker 06/13/2021   Rotator cuff strain 01/30/2021   Paroxysmal supraventricular tachycardia ([place]) 01/29/2020   Adult ADHD 03/14/2019   Severe obstructive sleep apnea 03/14/2019   Pelvic pain in female 11/24/2017   Hyperlipidemia, mixed 08/16/2017   Plantar fasciitis, right 07/07/2017   Ischial bursitis, right 11/26/2016   Primary osteoarthritis of both knees 11/26/2016   Obesity 01/30/2016   Anxiety and depression 12/05/2015   Treadmill stress test negative for angina pectoris 12/05/2015   Hypothyroidism 12/05/2015   Annual physical exam 12/05/2015   Positive ANA (antinuclear antibody) 12/05/2015    PCP: Silverio Decamp, MD  REFERRING PROVIDER: Lennice Sites, MD  REFERRING DIAG:  N62 (ICD-10-CM) - Macromastia  M54.6,G89.29 (ICD-10-CM) - Chronic bilateral thoracic back pain    Rationale for Evaluation and Treatment Rehabilitation  THERAPY DIAG:  Pain in thoracic spine  Muscle weakness (generalized)  Other low back pain  ONSET DATE: 5+ years  SUBJECTIVE:                                                                                                                                                                                            SUBJECTIVE STATEMENT:  Pt states she has the same behind the shoulder pain. It still "takes my breath away."   PERTINENT HISTORY:  Vertigo, tinnitus, depression/anxiety  PAIN:  Are you having pain? Yes: NPRS scale: 8/10 Pain location: L/S and T/S Pain description: pinching in upper back, ache into neck shoulders and low back Aggravating factors: bending, lifting, standing too long, working  Relieving factors: PPT in standing,  heat, ice, massage   PRECAUTIONS: None  WEIGHT BEARING RESTRICTIONS No  FALLS:  Has patient fallen in last 6 months? 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;   Hobbies: walking/hiking;   PLOF: Independent  PATIENT GOALS :    OBJECTIVE:   DIAGNOSTIC FINDINGS: N/A   PATIENT SURVEYS:  FOTO 46 54 @ DC 6 MCII    TODAY'S TREATMENT   6/22   -Soft tissue mobilization to R thoracic paraspinals, T7-9 -seated QL/Lat stretch 30s 3x -standing QL/lat stretch 30s 3x -self release with foam roller against wall 2x10 -Standing lat stretch with cable machine 10s 5x -jefferson curl 2x3 at wall    6/14 -Soft tissue mobilization to R thoracic paraspinals, T7-9 -Grade I/II CPA mobs, T7-9 -Forward flexion stretch, red ball, 3x10 -Flexion/side bending stretch, red ball, 2x 10 -Thoracic rotation wall stretch - 2x10   Exercises - Seated Thoracic Lumbar Extension  - 4-5 x daily - 7 x weekly - 1 sets - 10 reps - 3 hold - Shoulder External Rotation and Scapular Retraction with Resistance  - 4-5 x daily - 7 x weekly - 1 sets - 10 reps - Theracane Over Shoulder  - 1 x daily - 7 x weekly - 3 sets - 10 reps  Self tennis ball massage   PATIENT EDUCATION:  Education details: anatomy, exercise  progression, DOMS expectations, muscle firing,  envelope of function, HEP, POC  Person educated: Patient Education method: Explanation, Demonstration, Tactile cues, Verbal cues, and Handouts- provided HEP via email/text Education comprehension: verbalized understanding, returned demonstration, verbal cues required, and tactile cues required   HOME EXERCISE PROGRAM: Access Code: [email or web address] URL: [email or web address] Date: 04/09/2022 Prepared by: Zebedee Iba  ASSESSMENT:  CLINICAL IMPRESSION:  Pt presents with increased T/S pain at beginning of session that was improved with thoracic mobility, stretching, and manual therapy. Pt responded well to T/S extension mobility as well as generalized paraspinal and lat stretching. Pt to travel the next week for work so advised on pain management strategies during work/travel. Plan to perform UPA at pain level next session as well as considering addition of higher resistance postural strengthening. Pt will continue to benefit from skilled therapy to maximize thoracic ROM, strength, and stability to return pt to full function.  OBJECTIVE IMPAIRMENTS Abnormal gait, decreased activity tolerance, difficulty walking, decreased ROM, decreased strength, hypomobility, increased muscle spasms, impaired flexibility, impaired sensation, impaired UE functional use, improper body mechanics, postural dysfunction, obesity, and pain.   ACTIVITY LIMITATIONS carrying, lifting, sitting, standing, sleeping, and reach over head  PARTICIPATION LIMITATIONS: cleaning, laundry, driving, shopping, community activity, occupation, and exercise  PERSONAL FACTORS Age, Fitness, and 1-2 comorbidities:    are also affecting patient's functional outcome.   REHAB POTENTIAL: Good  CLINICAL DECISION MAKING: Stable/uncomplicated  EVALUATION COMPLEXITY: Low   GOALS:   SHORT TERM GOALS: Target date: 05/21/2022  Pt will become independent with HEP in order to demonstrate  synthesis of PT education.   Goal status: INITIAL  2.  Pt will report at least 2 pt reduction on NPRS scale for pain in order to demonstrate functional improvement with household activity, self care, and ADL.   Goal status: INITIAL  3.  Pt will be able to demonstrate/report ability to sit/stand/sleep for extended periods of time without pain in order to demonstrate functional improvement and tolerance to static positioning.   Goal status: INITIAL    LONG TERM GOALS: Target date: 07/02/2022  Pt  will become independent with final HEP in order to demonstrate synthesis of PT education.  Goal status: INITIAL  2.  Pt will score >/= 54 on FOTO to demonstrate improvement in perceived thoracic function. .   Goal status: INITIAL  3.  Pt will be able to demonstrate full available T/S and L/S motion without pain in order to demonstrate functional improvement in spinal mobility for self-care and house hold duties.   Goal status: INITIAL  4.  Pt will be able to report ability to return to normal ADL and hiking without T/S or LBP pain in order to demonstrate functional improvement in UE/LE function for return to exercise and fitness.  Goal status: INITIAL   PLAN: PT FREQUENCY: 1-2x/week  PT DURATION: 12 weeks (likely d/c in 8 wks)  PLANNED INTERVENTIONS: Therapeutic exercises, Therapeutic activity, Neuromuscular re-education, Balance training, Gait training, Patient/Family education, Joint manipulation, Joint mobilization, Orthotic/Fit training, Aquatic Therapy, Dry Needling, Electrical stimulation, Spinal manipulation, Spinal mobilization, Cryotherapy, Moist heat, scar mobilization, Taping, Vasopneumatic device, Traction, Ultrasound, Biofeedback, Ionotophoresis 4mg /ml Dexamethasone, Manual therapy, and Re-evaluation.  PLAN FOR NEXT SESSION: manual to T/S, seated rowing, cat/cow, T/S open book, wall angel   , PT 04/23/2022, 3:57 PM

## 2022-04-24 DIAGNOSIS — G4733 Obstructive sleep apnea (adult) (pediatric): Secondary | ICD-10-CM | POA: Diagnosis not present

## 2022-04-29 ENCOUNTER — Other Ambulatory Visit: Payer: 59

## 2022-04-30 NOTE — Telephone Encounter (Signed)
PA determination received from [facility] PA has been approved for Orth[facility]  Pt aware of approval  and her approximate cost. She will call us to schedule when she has had time to discuss with her husband and is she is back in town.

## 2022-05-04 ENCOUNTER — Ambulatory Visit (HOSPITAL_BASED_OUTPATIENT_CLINIC_OR_DEPARTMENT_OTHER): Payer: 59 | Attending: Plastic Surgery | Admitting: Physical Therapy

## 2022-05-04 ENCOUNTER — Encounter (HOSPITAL_BASED_OUTPATIENT_CLINIC_OR_DEPARTMENT_OTHER): Payer: Self-pay | Admitting: Physical Therapy

## 2022-05-04 DIAGNOSIS — M546 Pain in thoracic spine: Secondary | ICD-10-CM | POA: Diagnosis present

## 2022-05-04 DIAGNOSIS — M6281 Muscle weakness (generalized): Secondary | ICD-10-CM | POA: Diagnosis present

## 2022-05-04 DIAGNOSIS — M5459 Other low back pain: Secondary | ICD-10-CM | POA: Diagnosis present

## 2022-05-04 NOTE — Therapy (Signed)
OUTPATIENT PHYSICAL THERAPY THORACOLUMBAR TREATMENT  Patient Name: Ann Greene MRN: [id number] DOB:May 27, 1966, 56 y.o., female Today's Date: 05/04/2022   PT End of Session - 05/04/22 1515     Visit Number 4    Number of Visits 15    Date for PT Re-Evaluation 07/08/22    Authorization Type Aet[employer]    PT Start Time 1433    PT Stop Time 1513    PT Time Calculation (min) 40 min    Activity Tolerance Patient tolerated treatment well    Behavior During Therapy [facility] for tasks assessed/performed               Past Medical History:  Diagnosis Date   Lichen sclerosus et atrophicus    Thyroid disease    Hashimoto's thyroiditis   Past Surgical History:  Procedure Laterality Date   CHOLECYSTECTOMY  1996   TONSILLECTOMY     Patient Active Problem List   Diagnosis Date Noted   Macromastia 02/27/2022   Lichen sclerosus et atrophicus 07/31/2021   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   Former smoker 06/13/2021   Rotator cuff strain 01/30/2021   Paroxysmal supraventricular tachycardia ([facility]) 01/29/2020   Adult ADHD 03/14/2019   Severe obstructive sleep apnea 03/14/2019   Pelvic pain in female 11/24/2017   Hyperlipidemia, mixed 08/16/2017   Plantar fasciitis, right 07/07/2017   Ischial bursitis, right 11/26/2016   Primary osteoarthritis of both knees 11/26/2016   Obesity 01/30/2016   Anxiety and depression 12/05/2015   Treadmill stress test negative for angina pectoris 12/05/2015   Hypothyroidism 12/05/2015   Annual physical exam 12/05/2015   Positive ANA (antinuclear antibody) 12/05/2015    PCP: Monica Becton, MD  REFERRING PROVIDER: Janne Napoleon, MD  REFERRING DIAG:  N62 (ICD-10-CM) - Macromastia  M54.6,G89.29 (ICD-10-CM) - Chronic bilateral thoracic back pain    Rationale for Evaluation and Treatment Rehabilitation  THERAPY DIAG:  Pain in thoracic spine  Muscle weakness (generalized)  Other low back pain  ONSET DATE: 5+ years  SUBJECTIVE:                                                                                                                                                                                            SUBJECTIVE STATEMENT:  Pt states she states was feeling much better until yesterday. She states she was lifting luggage yesterday when she "re-injured it" in the same spot.   PERTINENT HISTORY:  Vertigo, tinnitus, depression/anxiety  PAIN:  Are you having pain? Yes: NPRS scale: 5/10 Pain location: L/S and T/S Pain description: pinching in upper back, ache into neck shoulders and low back Aggravating factors: bending, lifting,  standing too long, working  Relieving factors: PPT in standing, heat, ice, massage   PRECAUTIONS: None  WEIGHT BEARING RESTRICTIONS No  FALLS:  Has patient fallen in last 6 months? 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;   Hobbies: walking/hiking;   PLOF: Independent  OBJECTIVE:   DIAGNOSTIC FINDINGS: N/A   PATIENT SURVEYS:  FOTO 46 54 @ DC 6 MCII   TODAY'S TREATMENT   7/3   -Soft tissue mobilization to R thoracic paraspinals, T7-9 -UPA and CPA grade IV T5-9  -Cat/cow 3s 10x -seated cable row with forward T/S flexion 2x10 20lbs  -standing face pull 2x10 15lbs  -wall T/S ext over foam roller 3s 10x -2x5 jefferson curl  -90/90 pec stretch 30s 2x each with rotation away    PATIENT EDUCATION:  Education details: anatomy, exercise progression, DOMS expectations, muscle firing,  envelope of function, HEP, POC  Person educated: Patient Education method: Explanation, Demonstration, Tactile cues, Verbal cues, and Handouts- provided HEP via email/text Education comprehension: verbalized understanding, returned demonstration, verbal cues required, and tactile cues required   HOME EXERCISE PROGRAM: Access Code: [email or web address] URL: [email or web address] Date:  04/09/2022 Prepared by: Zebedee Iba  ASSESSMENT:  CLINICAL IMPRESSION:  Pt with report of decrease stiffness and pain following manual therapy and exercise a today's session. Pt's 5/10 reduced to 2/10 at end of treatment. Pt with greatest relief following ext mobilizations and self T/S mobilizations. Pt HEP updated to include resisted UE/postural strength as well as ant pec/chest opener. Pt without increase in pain throughout session. Plan to continue with T/S mobility and postural strength as tolerated. Pt will continue to benefit from skilled therapy to maximize thoracic ROM, strength, and stability to return pt to full function.  OBJECTIVE IMPAIRMENTS Abnormal gait, decreased activity tolerance, difficulty walking, decreased ROM, decreased strength, hypomobility, increased muscle spasms, impaired flexibility, impaired sensation, impaired UE functional use, improper body mechanics, postural dysfunction, obesity, and pain.   ACTIVITY LIMITATIONS carrying, lifting, sitting, standing, sleeping, and reach over head  PARTICIPATION LIMITATIONS: cleaning, laundry, driving, shopping, community activity, occupation, and exercise  PERSONAL FACTORS Age, Fitness, and 1-2 comorbidities:    are also affecting patient's functional outcome.   REHAB POTENTIAL: Good  CLINICAL DECISION MAKING: Stable/uncomplicated  EVALUATION COMPLEXITY: Low   GOALS:   SHORT TERM GOALS: Target date: 05/21/2022  Pt will become independent with HEP in order to demonstrate synthesis of PT education.   Goal status: INITIAL  2.  Pt will report at least 2 pt reduction on NPRS scale for pain in order to demonstrate functional improvement with household activity, self care, and ADL.   Goal status: INITIAL  3.  Pt will be able to demonstrate/report ability to sit/stand/sleep for extended periods of time without pain in order to demonstrate functional improvement and tolerance to static positioning.   Goal status:  INITIAL    LONG TERM GOALS: Target date: 07/02/2022  Pt  will become independent with final HEP in order to demonstrate synthesis of PT education.  Goal status: INITIAL  2.  Pt will score >/= 54 on FOTO to demonstrate improvement in perceived thoracic function. .   Goal status: INITIAL  3.  Pt will be able to demonstrate full available T/S and L/S motion without pain in order to demonstrate functional improvement in spinal mobility for self-care and house hold duties.   Goal status: INITIAL  4.  Pt will be able to report ability to return  to normal ADL and hiking without T/S or LBP pain in order to demonstrate functional improvement in UE/LE function for return to exercise and fitness.  Goal status: INITIAL   PLAN: PT FREQUENCY: 1-2x/week  PT DURATION: 12 weeks (likely d/c in 8 wks)  PLANNED INTERVENTIONS: Therapeutic exercises, Therapeutic activity, Neuromuscular re-education, Balance training, Gait training, Patient/Family education, Joint manipulation, Joint mobilization, Orthotic/Fit training, Aquatic Therapy, Dry Needling, Electrical stimulation, Spinal manipulation, Spinal mobilization, Cryotherapy, Moist heat, scar mobilization, Taping, Vasopneumatic device, Traction, Ultrasound, Biofeedback, Ionotophoresis 4mg /ml Dexamethasone, Manual therapy, and Re-evaluation.  PLAN FOR NEXT SESSION: manual to T/S, seated rowing, cat/cow, T/S open book, wall angel   Daleen Bo, PT 05/04/2022, 3:16 PM

## 2022-05-07 ENCOUNTER — Encounter (HOSPITAL_BASED_OUTPATIENT_CLINIC_OR_DEPARTMENT_OTHER): Payer: Self-pay | Admitting: Physical Therapy

## 2022-05-07 ENCOUNTER — Ambulatory Visit (HOSPITAL_BASED_OUTPATIENT_CLINIC_OR_DEPARTMENT_OTHER): Payer: 59 | Admitting: Physical Therapy

## 2022-05-07 DIAGNOSIS — M5459 Other low back pain: Secondary | ICD-10-CM | POA: Diagnosis not present

## 2022-05-07 DIAGNOSIS — M546 Pain in thoracic spine: Secondary | ICD-10-CM

## 2022-05-07 DIAGNOSIS — M6281 Muscle weakness (generalized): Secondary | ICD-10-CM | POA: Diagnosis not present

## 2022-05-07 NOTE — Therapy (Signed)
OUTPATIENT PHYSICAL THERAPY THORACOLUMBAR TREATMENT  Patient Name: Ann Greene MRN: [id number] DOB:06-05-1966, 56 y.o., female Today's Date: 05/07/2022   PT End of Session - 05/07/22 1009     Visit Number 5    Number of Visits 15    Date for PT Re-Evaluation 07/08/22    Authorization Type Aetna    PT Start Time 0930    PT Stop Time 1010    PT Time Calculation (min) 40 min    Activity Tolerance Patient tolerated treatment well    Behavior During Therapy [facility] for tasks assessed/performed                Past Medical History:  Diagnosis Date   Lichen sclerosus et atrophicus    Thyroid disease    Hashimoto's thyroiditis   Past Surgical History:  Procedure Laterality Date   CHOLECYSTECTOMY  1996   TONSILLECTOMY     Patient Active Problem List   Diagnosis Date Noted   Macromastia [id number]   Lichen sclerosus et atrophicus 07/31/2021   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   Former smoker 06/13/2021   Rotator cuff strain 01/30/2021   Paroxysmal supraventricular tachycardia ([place]) 01/29/2020   Adult ADHD 03/14/2019   Severe obstructive sleep apnea 03/14/2019   Pelvic pain in female 11/24/2017   Hyperlipidemia, mixed 08/16/2017   Plantar fasciitis, right 07/07/2017   Ischial bursitis, right 11/26/2016   Primary osteoarthritis of both knees 11/26/2016   Obesity 01/30/2016   Anxiety and depression 12/05/2015   Treadmill stress test negative for angina pectoris 12/05/2015   Hypothyroidism 12/05/2015   Annual physical exam 12/05/2015   Positive ANA (antinuclear antibody) 12/05/2015    PCP: Silverio Decamp, MD  REFERRING PROVIDER: Lennice Sites, MD  REFERRING DIAG:  N62 (ICD-10-CM) - Macromastia  M54.6,G89.29 (ICD-10-CM) - Chronic bilateral thoracic back pain    Rationale for Evaluation and Treatment Rehabilitation  THERAPY DIAG:  Pain in thoracic spine  Muscle weakness (generalized)  Other low back pain  ONSET DATE: 5+ years  SUBJECTIVE:                                                                                                                                                                                            SUBJECTIVE STATEMENT:  Pt states sje was very sore after last session. She feels achy today into her entire upper back  PERTINENT HISTORY:  Vertigo, tinnitus, depression/anxiety  PAIN:  Are you having pain? Yes: NPRS scale: 7/10 Pain location: L/S and T/S Pain description: pinching in upper back, ache into neck shoulders and low back Aggravating factors: bending, lifting, standing too long, working  Relieving factors:  PPT in standing, heat, ice, massage   PRECAUTIONS: None  WEIGHT BEARING RESTRICTIONS No  FALLS:  Has patient fallen in last 6 months? 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;   Hobbies: walking/hiking;   PLOF: Independent  OBJECTIVE:   DIAGNOSTIC FINDINGS: N/A   PATIENT SURVEYS:  FOTO 46 54 @ DC 6 MCII   TODAY'S TREATMENT   7/6   -Soft tissue mobilization to bilat thoracic paraspinals, T7-9 -UPA and CPA grade IV T3-12  -Cat/cow 3s 10x -seated fwd flexion with ball 5s 10x -TS foam roll ext at wall 3s 10x -foam roll lat against wall 5s 10x -upper back stretch 30s 3x   7/3   -Soft tissue mobilization to R thoracic paraspinals, T7-9 -UPA and CPA grade IV T5-9  -Cat/cow 3s 10x -seated cable row with forward T/S flexion 2x10 20lbs  -standing face pull 2x10 15lbs  -wall T/S ext over foam roller 3s 10x -2x5 jefferson curl  -90/90 pec stretch 30s 2x each with rotation away    PATIENT EDUCATION:  Education details: anatomy, exercise progression, DOMS expectations, muscle firing,  envelope of function, HEP, POC  Person educated: Patient Education method: Explanation, Demonstration, Tactile cues, Verbal cues, and Handouts- provided HEP via email/text Education comprehension:  verbalized understanding, returned demonstration, verbal cues required, and tactile cues required   HOME EXERCISE PROGRAM: Access Code: [email or web address] URL: [email or web address] Date: 04/09/2022 Prepared by: Zebedee Iba  ASSESSMENT:  CLINICAL IMPRESSION:  Pt presents with what appears to be DOMS from previous session. Pt's muscle hypertonicity and TTP are within paraspinals, lat, and rhomboids which were targeted at last session. Pt session focused on improving soft tissue extensibility, thoracic mobility, and self pain management techniques. Pt did respond well to stretching and mobility. Pt advised to take next 1-2 days off from HEP and continue when soreness subsides. Pt will continue to benefit from skilled therapy to maximize thoracic ROM, strength, and stability to return pt to full function.  OBJECTIVE IMPAIRMENTS Abnormal gait, decreased activity tolerance, difficulty walking, decreased ROM, decreased strength, hypomobility, increased muscle spasms, impaired flexibility, impaired sensation, impaired UE functional use, improper body mechanics, postural dysfunction, obesity, and pain.   ACTIVITY LIMITATIONS carrying, lifting, sitting, standing, sleeping, and reach over head  PARTICIPATION LIMITATIONS: cleaning, laundry, driving, shopping, community activity, occupation, and exercise  PERSONAL FACTORS Age, Fitness, and 1-2 comorbidities:    are also affecting patient's functional outcome.   REHAB POTENTIAL: Good  CLINICAL DECISION MAKING: Stable/uncomplicated  EVALUATION COMPLEXITY: Low   GOALS:   SHORT TERM GOALS: Target date: 05/21/2022  Pt will become independent with HEP in order to demonstrate synthesis of PT education.   Goal status: INITIAL  2.  Pt will report at least 2 pt reduction on NPRS scale for pain in order to demonstrate functional improvement with household activity, self care, and ADL.   Goal status: INITIAL  3.  Pt will be able to  demonstrate/report ability to sit/stand/sleep for extended periods of time without pain in order to demonstrate functional improvement and tolerance to static positioning.   Goal status: INITIAL    LONG TERM GOALS: Target date: 07/02/2022  Pt  will become independent with final HEP in order to demonstrate synthesis of PT education.  Goal status: INITIAL  2.  Pt will score >/= 54 on FOTO to demonstrate improvement in perceived thoracic function. .   Goal status: INITIAL  3.  Pt will be able to  demonstrate full available T/S and L/S motion without pain in order to demonstrate functional improvement in spinal mobility for self-care and house hold duties.   Goal status: INITIAL  4.  Pt will be able to report ability to return to normal ADL and hiking without T/S or LBP pain in order to demonstrate functional improvement in UE/LE function for return to exercise and fitness.  Goal status: INITIAL   PLAN: PT FREQUENCY: 1-2x/week  PT DURATION: 12 weeks (likely d/c in 8 wks)  PLANNED INTERVENTIONS: Therapeutic exercises, Therapeutic activity, Neuromuscular re-education, Balance training, Gait training, Patient/Family education, Joint manipulation, Joint mobilization, Orthotic/Fit training, Aquatic Therapy, Dry Needling, Electrical stimulation, Spinal manipulation, Spinal mobilization, Cryotherapy, Moist heat, scar mobilization, Taping, Vasopneumatic device, Traction, Ultrasound, Biofeedback, Ionotophoresis 4mg /ml Dexamethasone, Manual therapy, and Re-evaluation.  PLAN FOR NEXT SESSION: manual to T/S, seated rowing, cat/cow, T/S open book, wall angel   , PT 05/07/2022, 10:12 AM

## 2022-05-09 ENCOUNTER — Other Ambulatory Visit: Payer: Self-pay | Admitting: Sports Medicine

## 2022-05-09 DIAGNOSIS — F411 Generalized anxiety disorder: Secondary | ICD-10-CM

## 2022-05-11 ENCOUNTER — Ambulatory Visit (HOSPITAL_BASED_OUTPATIENT_CLINIC_OR_DEPARTMENT_OTHER): Payer: 59 | Admitting: Physical Therapy

## 2022-05-14 ENCOUNTER — Encounter (HOSPITAL_BASED_OUTPATIENT_CLINIC_OR_DEPARTMENT_OTHER): Payer: Self-pay | Admitting: Physical Therapy

## 2022-05-14 ENCOUNTER — Ambulatory Visit (HOSPITAL_BASED_OUTPATIENT_CLINIC_OR_DEPARTMENT_OTHER): Payer: 59 | Admitting: Physical Therapy

## 2022-05-14 DIAGNOSIS — M6281 Muscle weakness (generalized): Secondary | ICD-10-CM | POA: Diagnosis not present

## 2022-05-14 DIAGNOSIS — M546 Pain in thoracic spine: Secondary | ICD-10-CM

## 2022-05-14 DIAGNOSIS — M5459 Other low back pain: Secondary | ICD-10-CM

## 2022-05-14 NOTE — Therapy (Signed)
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   Former smoker 06/13/2021   Rotator cuff strain 01/30/2021   Paroxysmal supraventricular tachycardia (HCC) 01/29/2020   Adult ADHD 03/14/2019   Severe obstructive sleep apnea 03/14/2019   Pelvic pain in female 11/24/2017   Hyperlipidemia, mixed 08/16/2017   Plantar fasciitis, right 07/07/2017   Ischial bursitis, right 11/26/2016   Primary osteoarthritis of both knees 11/26/2016   Obesity 01/30/2016   Anxiety and depression 12/05/2015   Treadmill stress test negative for angina pectoris 12/05/2015   Hypothyroidism 12/05/2015   Annual physical exam 12/05/2015   Positive ANA (antinuclear antibody) 12/05/2015    PCP: Monica Becton, MD  REFERRING PROVIDER: Janne Napoleon, MD  REFERRING DIAG:  N62 (ICD-10-CM) - Macromastia  M54.6,G89.29 (ICD-10-CM) - Chronic bilateral thoracic back pain    Rationale for Evaluation and Treatment Rehabilitation  THERAPY DIAG:  No diagnosis found.  ONSET DATE: 5+ years  SUBJECTIVE:                                                                                                                                                                                           SUBJECTIVE STATEMENT:  Pt reports her pain is improved a bit. She states it has been fluctuating a good amount - feels ok sitting, can be worse with standing. She has one incident of reaching for luggage and felt it "tweak" a bit, had some increase  in pain following.   PERTINENT HISTORY:  Vertigo, tinnitus, depression/anxiety  PAIN:  Are you having pain? Yes: NPRS scale: 7/10 Pain location: L/S and T/S Pain description: pinching in upper back, ache into neck shoulders and low back Aggravating factors: bending, lifting, standing too long, working  Relieving factors: PPT in standing, heat, ice, massage   PRECAUTIONS: None  WEIGHT BEARING RESTRICTIONS No  FALLS:  Has patient fallen in last 6 months? 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;   Hobbies: walking/hiking;   PLOF: Independent  OBJECTIVE:   DIAGNOSTIC FINDINGS: N/A   PATIENT SURVEYS:  FOTO 46 54 @  DC 6 MCII   TODAY'S TREATMENT   7/13  -Soft tissue mobilization to bilat thoracic paraspinals, T7-9 -UPA and CPA grade IV T3-12  -seated fwd flexion with ball 5s 10x -upper back stretch 30s 3x Open book at the wall 2x10  --TS foam roll ext at wall 3s 10x  Posterior capsule stretch with rotation 2x20 sec hold    7/6   -Soft tissue mobilization to bilat thoracic paraspinals, T7-9 -UPA and CPA grade IV T3-12  -Cat/cow 3s 10x -seated fwd flexion with ball 5s 10x -TS foam roll ext at wall 3s 10x -foam roll lat against wall 5s 10x -upper back stretch 30s 3x   7/3   -Soft tissue mobilization to R thoracic paraspinals, T7-9 -UPA and CPA grade IV T5-9  -Cat/cow 3s 10x -seated cable row with forward T/S flexion 2x10 20lbs  -standing face pull 2x10 15lbs  -wall T/S ext over foam roller 3s 10x -2x5 jefferson curl  -90/90 pec stretch 30s 2x each with rotation away    PATIENT EDUCATION:  Education details: anatomy, exercise progression, DOMS expectations, muscle firing,  envelope of function, HEP, POC  Person educated: Patient Education method: Explanation, Demonstration, Tactile cues, Verbal cues, and Handouts- provided HEP via  email/text Education comprehension: verbalized understanding, returned demonstration, verbal cues required, and tactile cues required   HOME EXERCISE PROGRAM: Access Code: [email or web address] URL: [email or web address] Date: 04/09/2022 Prepared by: Zebedee Iba  ASSESSMENT:  CLINICAL IMPRESSION:  Therapy spoke with the patient about dry needling. She has had it in the past but is more nervous about it over the lung fields. Therapy assured her it is safe although there are more risks then with the hip. She had a focal trigger point in her mid thoracic right paraspinal. She felt better after her exercises, soft tissue and stretching   OBJECTIVE IMPAIRMENTS Abnormal gait, decreased activity tolerance, difficulty walking, decreased ROM, decreased strength, hypomobility, increased muscle spasms, impaired flexibility, impaired sensation, impaired UE functional use, improper body mechanics, postural dysfunction, obesity, and pain.   ACTIVITY LIMITATIONS carrying, lifting, sitting, standing, sleeping, and reach over head  PARTICIPATION LIMITATIONS: cleaning, laundry, driving, shopping, community activity, occupation, and exercise  PERSONAL FACTORS Age, Fitness, and 1-2 comorbidities:    are also affecting patient's functional outcome.   REHAB POTENTIAL: Good  CLINICAL DECISION MAKING: Stable/uncomplicated  EVALUATION COMPLEXITY: Low   GOALS:   SHORT TERM GOALS: Target date: 05/21/2022  Pt will become independent with HEP in order to demonstrate synthesis of PT education.   Goal status: INITIAL  2.  Pt will report at least 2 pt reduction on NPRS scale for pain in order to demonstrate functional improvement with household activity, self care, and ADL.   Goal status: INITIAL  3.  Pt will be able to demonstrate/report ability to sit/stand/sleep for extended periods of time without pain in order to demonstrate functional improvement and tolerance to static positioning.   Goal status:  INITIAL    LONG TERM GOALS: Target date: 07/02/2022  Pt  will become independent with final HEP in order to demonstrate synthesis of PT education.  Goal status: INITIAL  2.  Pt will score >/= 54 on FOTO to demonstrate improvement in perceived thoracic function. .   Goal status: INITIAL  3.  Pt will be able to demonstrate full available T/S and L/S motion without pain in order to demonstrate functional improvement in spinal mobility for self-care and house hold duties.   Goal status: INITIAL  4.  Pt will be able to  report ability to return to normal ADL and hiking without T/S or LBP pain in order to demonstrate functional improvement in UE/LE function for return to exercise and fitness.  Goal status: INITIAL   PLAN: PT FREQUENCY: 1-2x/week  PT DURATION: 12 weeks (likely d/c in 8 wks)  PLANNED INTERVENTIONS: Therapeutic exercises, Therapeutic activity, Neuromuscular re-education, Balance training, Gait training, Patient/Family education, Joint manipulation, Joint mobilization, Orthotic/Fit training, Aquatic Therapy, Dry Needling, Electrical stimulation, Spinal manipulation, Spinal mobilization, Cryotherapy, Moist heat, scar mobilization, Taping, Vasopneumatic device, Traction, Ultrasound, Biofeedback, Ionotophoresis 4mg /ml Dexamethasone, Manual therapy, and Re-evaluation.  PLAN FOR NEXT SESSION: manual to T/S, seated rowing, cat/cow, T/S open book, wall angel   , PT 05/14/2022, 1:45 PM

## 2022-05-23 ENCOUNTER — Other Ambulatory Visit (HOSPITAL_BASED_OUTPATIENT_CLINIC_OR_DEPARTMENT_OTHER): Payer: Self-pay | Admitting: Obstetrics & Gynecology

## 2022-05-23 DIAGNOSIS — N943 Premenstrual tension syndrome: Secondary | ICD-10-CM

## 2022-05-24 DIAGNOSIS — G4733 Obstructive sleep apnea (adult) (pediatric): Secondary | ICD-10-CM | POA: Diagnosis not present

## 2022-05-25 NOTE — Telephone Encounter (Signed)
LMOVM for pt to call regarding refill request 

## 2022-05-26 ENCOUNTER — Telehealth: Payer: Self-pay

## 2022-05-26 DIAGNOSIS — U071 COVID-19: Secondary | ICD-10-CM | POA: Insufficient documentation

## 2022-05-26 MED ORDER — NIRMATRELVIR/RITONAVIR (PAXLOVID)TABLET: ORAL_TABLET | ORAL | 0 refills | Status: DC

## 2022-05-26 NOTE — Telephone Encounter (Signed)
Patient called stating she tested positive for COVID and would like a prescription for Paxlovid called to the pharmacy. She was coughing during the VM.

## 2022-05-26 NOTE — Assessment & Plan Note (Signed)
Received call from [facility], she tested positive for COVID, she is at high risk for complications so we will add Paxlovid.

## 2022-05-26 NOTE — Telephone Encounter (Signed)
No problem, Paxlovid called in, remind her she will have a terrible taste in her mouth.

## 2022-05-27 ENCOUNTER — Encounter (HOSPITAL_BASED_OUTPATIENT_CLINIC_OR_DEPARTMENT_OTHER): Payer: Self-pay | Admitting: Physical Therapy

## 2022-05-27 NOTE — Telephone Encounter (Signed)
Patient aware Rx was sent to pharmacy.

## 2022-05-29 ENCOUNTER — Ambulatory Visit (HOSPITAL_BASED_OUTPATIENT_CLINIC_OR_DEPARTMENT_OTHER): Payer: 59 | Admitting: Physical Therapy

## 2022-06-01 ENCOUNTER — Ambulatory Visit (HOSPITAL_BASED_OUTPATIENT_CLINIC_OR_DEPARTMENT_OTHER): Payer: 59 | Admitting: Physical Therapy

## 2022-06-03 ENCOUNTER — Ambulatory Visit (HOSPITAL_BASED_OUTPATIENT_CLINIC_OR_DEPARTMENT_OTHER): Payer: 59 | Admitting: Physical Therapy

## 2022-06-08 ENCOUNTER — Telehealth (HOSPITAL_BASED_OUTPATIENT_CLINIC_OR_DEPARTMENT_OTHER): Payer: Self-pay | Admitting: Physical Therapy

## 2022-06-08 ENCOUNTER — Ambulatory Visit (HOSPITAL_BASED_OUTPATIENT_CLINIC_OR_DEPARTMENT_OTHER): Payer: 59 | Admitting: Physical Therapy

## 2022-06-08 NOTE — Telephone Encounter (Signed)
Left message regarding no-show appointment> Patient advised to please call if she can not make her next appointment.

## 2022-06-15 ENCOUNTER — Ambulatory Visit (HOSPITAL_BASED_OUTPATIENT_CLINIC_OR_DEPARTMENT_OTHER): Payer: 59 | Attending: Plastic Surgery | Admitting: Physical Therapy

## 2022-06-15 ENCOUNTER — Encounter (HOSPITAL_BASED_OUTPATIENT_CLINIC_OR_DEPARTMENT_OTHER): Payer: Self-pay | Admitting: Physical Therapy

## 2022-06-15 DIAGNOSIS — M5459 Other low back pain: Secondary | ICD-10-CM | POA: Diagnosis present

## 2022-06-15 DIAGNOSIS — M546 Pain in thoracic spine: Secondary | ICD-10-CM | POA: Insufficient documentation

## 2022-06-15 DIAGNOSIS — M6281 Muscle weakness (generalized): Secondary | ICD-10-CM | POA: Insufficient documentation

## 2022-06-15 NOTE — Therapy (Signed)
OUTPATIENT PHYSICAL THERAPY THORACOLUMBAR TREATMENT  Patient Name: Ann Greene MRN: [id number] DOB:01/16/1966, 56 y.o., female Today's Date: 06/15/2022   PT End of Session - 06/15/22 1149     Visit Number 7    Number of Visits 15    Date for PT Re-Evaluation 07/08/22    Authorization Type [employer]    PT Start Time 1146    PT Stop Time 1220    PT Time Calculation (min) 34 min    Activity Tolerance Patient tolerated treatment well    Behavior During Therapy WFL for tasks assessed/performed                 Past Medical History:  Diagnosis Date   Lichen sclerosus et atrophicus    Thyroid disease    Hashimoto's thyroiditis   Past Surgical History:  Procedure Laterality Date   Victor     Patient Active Problem List   Diagnosis Date Noted   COVID-19 05/26/2022   Macromastia 97/35/3299   Lichen sclerosus et atrophicus 07/31/2021   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   Former smoker 06/13/2021   Rotator cuff strain 01/30/2021   Paroxysmal supraventricular tachycardia ([place]) 01/29/2020   Adult ADHD 03/14/2019   Severe obstructive sleep apnea 03/14/2019   Pelvic pain in female 11/24/2017   Hyperlipidemia, mixed 08/16/2017   Plantar fasciitis, right 07/07/2017   Ischial bursitis, right 11/26/2016   Primary osteoarthritis of both knees 11/26/2016   Obesity 01/30/2016   Anxiety and depression 12/05/2015   Treadmill stress test negative for angina pectoris 12/05/2015   Hypothyroidism 12/05/2015   Annual physical exam 12/05/2015   Positive ANA (antinuclear antibody) 12/05/2015    PCP: Silverio Decamp, MD  REFERRING PROVIDER: Lennice Sites, MD  REFERRING DIAG:  N62 (ICD-10-CM) - Macromastia  M54.6,G89.29 (ICD-10-CM) - Chronic bilateral thoracic back pain    Rationale for Evaluation and Treatment Rehabilitation  THERAPY DIAG:  Pain in thoracic spine  Muscle weakness (generalized)  Other low back pain  ONSET DATE: 5+  years  SUBJECTIVE:                                                                                                                                                                                           SUBJECTIVE STATEMENT:  Pt states that she has been battling COVID and the increase in coughing has increased R shoulder blade pain. L/S also tight from sitting around in general more.  PERTINENT HISTORY:  Vertigo, tinnitus, depression/anxiety  PAIN:  Are you having pain? Yes: NPRS scale: 2/10 Pain location: L/S and T/S Pain description: pinching in upper back, ache into neck  shoulders and low back Aggravating factors: bending, lifting, standing too long, working  Relieving factors: PPT in standing, heat, ice, massage   PRECAUTIONS: None  WEIGHT BEARING RESTRICTIONS No  FALLS:  Has patient fallen in last 6 months? 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;   Hobbies: walking/hiking;   PLOF: Independent  OBJECTIVE:   DIAGNOSTIC FINDINGS: N/A   PATIENT SURVEYS:  FOTO 46 54 @ DC 6 MCII   TODAY'S TREATMENT  8/14   -Soft tissue mobilization to bilat thoracic paraspinals, T7-9; R L/S L3-5 -UPA and CPA grade IV T3-12 and L1-5  -seated QL stretch 30s 3x -doorway QL stretch 30s 3x -supine LTR 3s 10x  7/13  -Soft tissue mobilization to bilat thoracic paraspinals, T7-9 -UPA and CPA grade IV T3-12  -seated fwd flexion with ball 5s 10x -upper back stretch 30s 3x Open book at the wall 2x10  --TS foam roll ext at wall 3s 10x  Posterior capsule stretch with rotation 2x20 sec hold    7/6   -Soft tissue mobilization to bilat thoracic paraspinals, T7-9 -UPA and CPA grade IV T3-12  -Cat/cow 3s 10x -seated fwd flexion with ball 5s 10x -TS foam roll ext at wall 3s 10x -foam roll lat against wall 5s 10x -upper back stretch 30s 3x   7/3   -Soft tissue mobilization to  R thoracic paraspinals, T7-9 -UPA and CPA grade IV T5-9  -Cat/cow 3s 10x -seated cable row with forward T/S flexion 2x10 20lbs  -standing face pull 2x10 15lbs  -wall T/S ext over foam roller 3s 10x -2x5 jefferson curl  -90/90 pec stretch 30s 2x each with rotation away    PATIENT EDUCATION:  Education details: anatomy, exercise progression, DOMS expectations, muscle firing,  envelope of function, HEP, POC  Person educated: Patient Education method: Explanation, Demonstration, Tactile cues, Verbal cues, and Handouts- provided HEP via email/text Education comprehension: verbalized understanding, returned demonstration, verbal cues required, and tactile cues required   HOME EXERCISE PROGRAM: Access Code: [email or web address] URL: [email or web address] Date: 04/09/2022 Prepared by: Daleen Bo  ASSESSMENT:  CLINICAL IMPRESSION:  Pt presents with increased R L/S and T/S paraspinals stiffness and tightness at today's session with increased pain. Pt would like to forego needling at today's session. Pt's pain was located at T12 on R side. Low back and T/S pain may be related to exacerbation from COVID and work related irritation. Plan to see pt for 2-3 more visits before returning back to MD. Pt's s/s resolve mildly with therapy but have repeated exacerbations that may be relate to body habitus and need for breast reduction surgery. Pt would benefit from continued skilled therapy in order to reach goals and maximize functional capacity prior to surgery.  OBJECTIVE IMPAIRMENTS Abnormal gait, decreased activity tolerance, difficulty walking, decreased ROM, decreased strength, hypomobility, increased muscle spasms, impaired flexibility, impaired sensation, impaired UE functional use, improper body mechanics, postural dysfunction, obesity, and pain.   ACTIVITY LIMITATIONS carrying, lifting, sitting, standing, sleeping, and reach over head  PARTICIPATION LIMITATIONS: cleaning, laundry, driving,  shopping, community activity, occupation, and exercise  PERSONAL FACTORS Age, Fitness, and 1-2 comorbidities:    are also affecting patient's functional outcome.   REHAB POTENTIAL: Good  CLINICAL DECISION MAKING: Stable/uncomplicated  EVALUATION COMPLEXITY: Low   GOALS:   SHORT TERM GOALS: Target date: 05/21/2022  Pt will become independent with HEP in order to demonstrate synthesis of PT education.   Goal status: met  2.  Pt will report at least 2 pt reduction on NPRS scale for pain in order to demonstrate functional improvement with household activity, self care, and ADL.   Goal status: met  3.  Pt will be able to demonstrate/report ability to sit/stand/sleep for extended periods of time without pain in order to demonstrate functional improvement and tolerance to static positioning.   Goal status: met    LONG TERM GOALS: Target date: 07/02/2022  Pt  will become independent with final HEP in order to demonstrate synthesis of PT education.  Goal status: ongoing  2.  Pt will score >/= 54 on FOTO to demonstrate improvement in perceived thoracic function. .   Goal status: ongoing  3.  Pt will be able to demonstrate full available T/S and L/S motion without pain in order to demonstrate functional improvement in spinal mobility for self-care and house hold duties.   Goal status: ongoing  4.  Pt will be able to report ability to return to normal ADL and hiking without T/S or LBP pain in order to demonstrate functional improvement in UE/LE function for return to exercise and fitness.  Goal status: ongoing   PLAN: PT FREQUENCY: 1-2x/week  PT DURATION: 12 weeks (likely d/c in 8 wks)  PLANNED INTERVENTIONS: Therapeutic exercises, Therapeutic activity, Neuromuscular re-education, Balance training, Gait training, Patient/Family education, Joint manipulation, Joint mobilization, Orthotic/Fit training, Aquatic Therapy, Dry Needling, Electrical stimulation, Spinal manipulation,  Spinal mobilization, Cryotherapy, Moist heat, scar mobilization, Taping, Vasopneumatic device, Traction, Ultrasound, Biofeedback, Ionotophoresis 34m/ml Dexamethasone, Manual therapy, and Re-evaluation.  PLAN FOR NEXT SESSION: manual to T/S, seated rowing, cat/cow, T/S open book, wall angel   ADaleen Bo PT 06/15/2022, 12:49 PM

## 2022-06-17 ENCOUNTER — Encounter (HOSPITAL_BASED_OUTPATIENT_CLINIC_OR_DEPARTMENT_OTHER): Payer: Self-pay | Admitting: Physical Therapy

## 2022-06-17 ENCOUNTER — Ambulatory Visit (HOSPITAL_BASED_OUTPATIENT_CLINIC_OR_DEPARTMENT_OTHER): Payer: 59 | Admitting: Physical Therapy

## 2022-06-17 DIAGNOSIS — M6281 Muscle weakness (generalized): Secondary | ICD-10-CM

## 2022-06-17 DIAGNOSIS — M546 Pain in thoracic spine: Secondary | ICD-10-CM

## 2022-06-17 DIAGNOSIS — M5459 Other low back pain: Secondary | ICD-10-CM | POA: Diagnosis not present

## 2022-06-17 NOTE — Therapy (Signed)
OUTPATIENT PHYSICAL THERAPY THORACOLUMBAR TREATMENT  Patient Name: Ann Greene MRN: [id number] DOB:05/27/1966, 56 y.o., female Today's Date: 06/17/2022   PT End of Session - 06/17/22 1039     Visit Number 8    Number of Visits 15    Date for PT Re-Evaluation 07/08/22    Authorization Type [employer]    PT Start Time 1015    PT Stop Time 1055    PT Time Calculation (min) 40 min    Activity Tolerance Patient tolerated treatment well    Behavior During Therapy WFL for tasks assessed/performed                  Past Medical History:  Diagnosis Date   Lichen sclerosus et atrophicus    Thyroid disease    Hashimoto's thyroiditis   Past Surgical History:  Procedure Laterality Date   [place]     Patient Active Problem List   Diagnosis Date Noted   COVID-19 05/26/2022   Macromastia 46/96/2952   Lichen sclerosus et atrophicus 07/31/2021   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   Former smoker 06/13/2021   Rotator cuff strain 01/30/2021   Paroxysmal supraventricular tachycardia ([place]) 01/29/2020   Adult ADHD 03/14/2019   Severe obstructive sleep apnea 03/14/2019   Pelvic pain in female 11/24/2017   Hyperlipidemia, mixed 08/16/2017   Plantar fasciitis, right 07/07/2017   Ischial bursitis, right 11/26/2016   Primary osteoarthritis of both knees 11/26/2016   Obesity 01/30/2016   Anxiety and depression 12/05/2015   Treadmill stress test negative for angina pectoris 12/05/2015   Hypothyroidism 12/05/2015   Annual physical exam 12/05/2015   Positive ANA (antinuclear antibody) 12/05/2015    PCP: Silverio Decamp, MD  REFERRING PROVIDER: Lennice Sites, MD  REFERRING DIAG:  N62 (ICD-10-CM) - Macromastia  M54.6,G89.29 (ICD-10-CM) - Chronic bilateral thoracic back pain    Rationale for Evaluation and Treatment Rehabilitation  THERAPY DIAG:  Pain in thoracic spine  Muscle weakness (generalized)  Other low back pain  ONSET DATE: 5+  years  SUBJECTIVE:                                                                                                                                                                                           SUBJECTIVE STATEMENT:  Pt states she had relief after last session but only lasted until the next morning. It still hurts after waking up in the morning. Pt states that sleeping on her stomach at night with R leg out is her normal position. Pt reports mattress is only about 54 years old and the mattresses when she travels cause the same  type of pain.  PERTINENT HISTORY:  Vertigo, tinnitus, depression/anxiety  PAIN:  Are you having pain? Yes: NPRS scale: 7/10 with twisting Pain location: L/S and T/S Pain description: pinching in upper back, ache into neck shoulders and low back Aggravating factors: bending, lifting, standing too long, working  Relieving factors: PPT in standing, heat, ice, massage   PRECAUTIONS: None  WEIGHT BEARING RESTRICTIONS No  FALLS:  Has patient fallen in last 6 months? 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;   Hobbies: walking/hiking;   PLOF: Independent  OBJECTIVE:   DIAGNOSTIC FINDINGS: N/A   PATIENT SURVEYS:  FOTO 46 54 @ DC 6 MCII   TODAY'S TREATMENT  8/16   -Soft tissue mobilization to bilat thoracic paraspinals, T7-9; R L/S L3-5 -UPA and CPA grade IV T3-12 and L1-5  -cat/cow stretch -Paloff press RTB 2x10 -seated fwd flexion stretch between feet 5s 10x -RTB shoulder ext 2x10 -lat stretch against wall 2x10 -seated rotation and reaching stretch 5s 10x  8/14   -Soft tissue mobilization to bilat thoracic paraspinals, T7-9; R L/S L3-5 -UPA and CPA grade IV T3-12 and L1-5  -seated QL stretch 30s 3x -doorway QL stretch 30s 3x -supine LTR 3s 10x  7/13  -Soft tissue mobilization to bilat thoracic paraspinals, T7-9 -UPA and CPA grade  IV T3-12  -seated fwd flexion with ball 5s 10x -upper back stretch 30s 3x Open book at the wall 2x10  --TS foam roll ext at wall 3s 10x  Posterior capsule stretch with rotation 2x20 sec hold    7/6   -Soft tissue mobilization to bilat thoracic paraspinals, T7-9 -UPA and CPA grade IV T3-12  -Cat/cow 3s 10x -seated fwd flexion with ball 5s 10x -TS foam roll ext at wall 3s 10x -foam roll lat against wall 5s 10x -upper back stretch 30s 3x   7/3   -Soft tissue mobilization to R thoracic paraspinals, T7-9 -UPA and CPA grade IV T5-9  -Cat/cow 3s 10x -seated cable row with forward T/S flexion 2x10 20lbs  -standing face pull 2x10 15lbs  -wall T/S ext over foam roller 3s 10x -2x5 jefferson curl  -90/90 pec stretch 30s 2x each with rotation away    PATIENT EDUCATION:  Education details: anatomy, exercise progression, DOMS expectations, muscle firing,  envelope of function, HEP, POC  Person educated: Patient Education method: Explanation, Demonstration, Tactile cues, Verbal cues, and Handouts- provided HEP via email/text Education comprehension: verbalized understanding, returned demonstration, verbal cues required, and tactile cues required   HOME EXERCISE PROGRAM: Access Code: [email or web address] URL: [email or web address] Date: 04/09/2022 Prepared by: Daleen Bo  ASSESSMENT:  CLINICAL IMPRESSION:  Pt with relief of pain following manual therapy and light stretching but pain returns with rotational motions. Pt's pain appears consistent with deep muscle spasming with recurrent exacerbation despite intro of mobility and strength exercise. Plan to D/C at next session for return to MD and further medical management. Pt's s/s resolve mildly with therapy sessions but has repeated exacerbations that may be relate to body habitus and need for breast reduction surgery. Pt would benefit from continued skilled therapy in order to reach goals and maximize functional capacity prior  to surgery.  OBJECTIVE IMPAIRMENTS Abnormal gait, decreased activity tolerance, difficulty walking, decreased ROM, decreased strength, hypomobility, increased muscle spasms, impaired flexibility, impaired sensation, impaired UE functional use, improper body mechanics, postural dysfunction, obesity, and pain.   ACTIVITY LIMITATIONS carrying, lifting, sitting, standing, sleeping, and reach over head  PARTICIPATION LIMITATIONS: cleaning, laundry, driving, shopping, community activity, occupation, and exercise  PERSONAL FACTORS Age, Fitness, and 1-2 comorbidities:    are also affecting patient's functional outcome.   REHAB POTENTIAL: Good  CLINICAL DECISION MAKING: Stable/uncomplicated  EVALUATION COMPLEXITY: Low   GOALS:   SHORT TERM GOALS: Target date: 05/21/2022  Pt will become independent with HEP in order to demonstrate synthesis of PT education.   Goal status: met  2.  Pt will report at least 2 pt reduction on NPRS scale for pain in order to demonstrate functional improvement with household activity, self care, and ADL.   Goal status: met  3.  Pt will be able to demonstrate/report ability to sit/stand/sleep for extended periods of time without pain in order to demonstrate functional improvement and tolerance to static positioning.   Goal status: met    LONG TERM GOALS: Target date: 07/02/2022  Pt  will become independent with final HEP in order to demonstrate synthesis of PT education.  Goal status: ongoing  2.  Pt will score >/= 54 on FOTO to demonstrate improvement in perceived thoracic function. .   Goal status: ongoing  3.  Pt will be able to demonstrate full available T/S and L/S motion without pain in order to demonstrate functional improvement in spinal mobility for self-care and house hold duties.   Goal status: ongoing  4.  Pt will be able to report ability to return to normal ADL and hiking without T/S or LBP pain in order to demonstrate functional  improvement in UE/LE function for return to exercise and fitness.  Goal status: ongoing   PLAN: PT FREQUENCY: 1-2x/week  PT DURATION: 12 weeks (likely d/c in 8 wks)  PLANNED INTERVENTIONS: Therapeutic exercises, Therapeutic activity, Neuromuscular re-education, Balance training, Gait training, Patient/Family education, Joint manipulation, Joint mobilization, Orthotic/Fit training, Aquatic Therapy, Dry Needling, Electrical stimulation, Spinal manipulation, Spinal mobilization, Cryotherapy, Moist heat, scar mobilization, Taping, Vasopneumatic device, Traction, Ultrasound, Biofeedback, Ionotophoresis 29m/ml Dexamethasone, Manual therapy, and Re-evaluation.  PLAN FOR NEXT SESSION: plan to D/C and return to MD for planning of reduction surgery   ADaleen Bo PT 06/17/2022, 11:14 AM

## 2022-06-22 ENCOUNTER — Ambulatory Visit (HOSPITAL_BASED_OUTPATIENT_CLINIC_OR_DEPARTMENT_OTHER): Payer: 59 | Admitting: Physical Therapy

## 2022-06-22 ENCOUNTER — Encounter (HOSPITAL_BASED_OUTPATIENT_CLINIC_OR_DEPARTMENT_OTHER): Payer: Self-pay | Admitting: Physical Therapy

## 2022-06-22 DIAGNOSIS — M5459 Other low back pain: Secondary | ICD-10-CM | POA: Diagnosis not present

## 2022-06-22 DIAGNOSIS — M546 Pain in thoracic spine: Secondary | ICD-10-CM | POA: Diagnosis not present

## 2022-06-22 DIAGNOSIS — M6281 Muscle weakness (generalized): Secondary | ICD-10-CM

## 2022-06-23 DIAGNOSIS — G4733 Obstructive sleep apnea (adult) (pediatric): Secondary | ICD-10-CM | POA: Diagnosis not present

## 2022-06-25 ENCOUNTER — Encounter (HOSPITAL_BASED_OUTPATIENT_CLINIC_OR_DEPARTMENT_OTHER): Payer: 59 | Admitting: Physical Therapy

## 2022-06-29 ENCOUNTER — Ambulatory Visit: Payer: 59 | Attending: Internal Medicine | Admitting: Internal Medicine

## 2022-06-29 ENCOUNTER — Encounter: Payer: Self-pay | Admitting: Internal Medicine

## 2022-06-29 VITALS — BP 110/72 | HR 72 | Ht 68.0 in | Wt 331.8 lb

## 2022-06-29 DIAGNOSIS — E782 Mixed hyperlipidemia: Secondary | ICD-10-CM | POA: Diagnosis not present

## 2022-06-29 DIAGNOSIS — Z9989 Dependence on other enabling machines and devices: Secondary | ICD-10-CM | POA: Diagnosis not present

## 2022-06-29 DIAGNOSIS — R072 Precordial pain: Secondary | ICD-10-CM | POA: Diagnosis not present

## 2022-06-29 DIAGNOSIS — I471 Supraventricular tachycardia: Secondary | ICD-10-CM | POA: Diagnosis not present

## 2022-06-29 DIAGNOSIS — R002 Palpitations: Secondary | ICD-10-CM | POA: Diagnosis not present

## 2022-06-29 DIAGNOSIS — G4733 Obstructive sleep apnea (adult) (pediatric): Secondary | ICD-10-CM

## 2022-06-29 NOTE — Progress Notes (Signed)
Cardiology Office Note:    Date:  06/29/2022   ID:  Ann Greene, DOB 1966/01/18, MRN [id number]  PCP:  Monica Becton, MD  Cardiologist:  [name], MD  Electrophysiologist:  None   Referring MD: Monica Becton,*   Chief Complaint/Reason for Referral: Follow-up palpitations and daytime fatigue   History of Present Illness:    Ann Greene is a 56 y.o. female with a history of anxiety and panic disorder, hypothyroidism with a history of Hashimoto's disease, positive ANA, osteoarthritis of the right knee, hyperlipidemia, adult ADHD previously on Vyvanse, and excessive daytime sleepiness who presents for follow-up of palpitations noted to be symptomatic SVT on cardiac monitor.   Last visit, she had gone to the ER with SVT after testing positive for COVID a few weeks prior. She had tried the PRN doses of metoprolol and vagal maneuvers but was unable to break rhythm initially. She was using CPAP.  Today:  She had another episode of SVT last night. As she was falling asleep, she felt weight on her chest, and felt panic. Her husband measured her heart rate at 190 bpm. Her husband got Metoprolol for her, she put her feet up, and she began to do vagal maneuvers. Her heart rate slowly began to go down. Her heart rate was at 190 for around 15 minutes. After 30 minutes, her heart rate was back down to 90.  She contracted COVID a month and a half ago, and she had a more difficult time recovering than the last time she had COVID.   She has not had any changes in her levothyroxine dose.   She has been trying to be more active recently.  She is slightly limited by knee pain and takes occasional doses of Mobic.  She is anticipating taking a cruise with her husband in 2 weeks to [place] and will be more active than usual.  She has a history of heart disease in her family. Her father died in his 51s from heart disease. His brother also died young from heart disease. Her grandmother  also died young from heart disease.  She denies any  shortness of breath, or peripheral edema. No lightheadedness, headaches, syncope, orthopnea, or PND.     Past Medical History:  Diagnosis Date   Lichen sclerosus et atrophicus    Thyroid disease    Hashimoto's thyroiditis    Past Surgical History:  Procedure Laterality Date   CHOLECYSTECTOMY  1996   TONSILLECTOMY      Current Medications: Current Meds  Medication Sig   buPROPion (WELLBUTRIN XL) 300 MG 24 hr tablet TAKE 1 TABLET BY MOUTH EVERY DAY   DULoxetine (CYMBALTA) 60 MG capsule TAKE 1 CAPSULE (60 MG TOTAL) BY MOUTH DAILY. DUE FOR FOLLOW UP.   levothyroxine (SYNTHROID) 150 MCG tablet Take 1 tablet (150 mcg total) by mouth daily before breakfast.   meloxicam (MOBIC) 15 MG tablet One tab PO qAM with a meal for 2 weeks, then daily prn pain.   metoprolol tartrate (LOPRESSOR) 25 MG tablet Take 1 tablet (25 mg total) by mouth 2 (two) times daily.     Allergies:   Patient has no known allergies.   Social History   Tobacco Use   Smoking status: Former    Packs/day: 0.50    Types: Cigarettes    Start date: 12/03/2020    Quit date: 07/09/2021    Years since quitting: 0.9   Smokeless tobacco: Never  Substance Use Topics   Alcohol use: Yes  Alcohol/week: 0.0 standard drinks of alcohol    Comment: occasional   Drug use: No     Family History: The patient's family history includes Alcohol abuse in her brother, father, and paternal uncle; Cancer in her brother and paternal uncle; Heart attack in her father and maternal grandmother; Suicidality in her brother.  ROS:   Please see the history of present illness.     (+)Palpitations (+)Chest Discomfort  All other systems reviewed and are negative.  EKGs/Labs/Other Studies Reviewed:    The following studies were reviewed today:  Echo 09/10/21:  IMPRESSIONS    1. Left ventricular ejection fraction, by estimation, is 60 to 65%. The  left ventricle has normal  function. The left ventricle has no regional  wall motion abnormalities. Left ventricular diastolic parameters are  consistent with Grade I diastolic  dysfunction (impaired relaxation).   2. Right ventricular systolic function is normal. The right ventricular  size is normal.   3. The mitral valve is normal in structure. No evidence of mitral valve  regurgitation. No evidence of mitral stenosis.   4. The aortic valve is normal in structure. Aortic valve regurgitation is  not visualized. No aortic stenosis is present.   5. The inferior vena cava is normal in size with greater than 50%  respiratory variability, suggesting right atrial pressure of 3 mmHg.   Comparison(s): No significant change from prior study. Prior images  reviewed side by side. 03/11/20 EF 60-65%.    Echo 03/11/2020: 1. Left ventricular ejection fraction, by estimation, is 60 to 65%. The  left ventricle has normal function. The left ventricle has no regional  wall motion abnormalities. There is moderate concentric left ventricular  hypertrophy. Left ventricular  diastolic parameters were normal.   2. Right ventricular systolic function is normal. The right ventricular  size is normal. There is normal pulmonary artery systolic pressure. The  estimated right ventricular systolic pressure is [id number] mmHg.   3. The mitral valve is normal in structure. No evidence of mitral valve  regurgitation. No evidence of mitral stenosis.   4. The aortic valve is normal in structure. Aortic valve regurgitation is  not visualized. No aortic stenosis is present.   5. The inferior vena cava is normal in size with greater than 50%  respiratory variability, suggesting right atrial pressure of 3 mmHg.   EKG:   06/29/22: NSR 09/09/21: Sinus bradycardia, rate 57 03/21/2021: NSR, rate 72, nonspecific T wave abnormality 09/16/2020: NSR, 81 bpm.   Recent Labs: 09/04/2021: Magnesium 1.9 02/27/2022: ALT 15; BUN 12; Creat 0.82; Hemoglobin 13.7;  Platelets 441; Potassium 5.1; Sodium 140; TSH 2.04  Recent Lipid Panel    Component Value Date/Time   CHOL 163 02/27/2022 0000   CHOL 199 09/16/2020 0912   TRIG 71 02/27/2022 0000   HDL 47 (L) 02/27/2022 0000   HDL 51 09/16/2020 0912   CHOLHDL 3.5 02/27/2022 0000   VLDL 17 12/19/2015 0810   LDLCALC 100 (H) 02/27/2022 0000    Physical Exam:    VS:  BP 110/72 (BP Location: Left Arm, Patient Position: Sitting, Cuff Size: Large)   Pulse 72   Ht 5\' 8"  (1.727 m)   Wt (!) 331 lb 12.8 oz (150.5 kg)   SpO2 94%   BMI 50.45 kg/m     Wt Readings from Last 5 Encounters:  06/29/22 (!) 331 lb 12.8 oz (150.5 kg)  03/16/22 (!) 322 lb 12.8 oz (146.4 kg)  02/27/22 (!) 323 lb 1.9 oz (146.6 kg)  09/09/21 Marland Kitchen)  319 lb (144.7 kg)  07/30/21 (!) 319 lb 6.4 oz (144.9 kg)    Constitutional: No acute distress Eyes: sclera non-icteric, normal conjunctiva and lids ENMT: normal dentition, moist mucous membranes Cardiovascular: regular rhythm, normal rate, no murmurs. S1 and S2 normal. Radial pulses normal bilaterally. No jugular venous distention.  Respiratory: clear to auscultation bilaterally GI : normal bowel sounds, soft and nontender. No distention.   MSK: extremities warm, well perfused. No edema.  NEURO: grossly nonfocal exam, moves all extremities. PSYCH: alert and oriented x 3, normal mood and affect.   ASSESSMENT:    1. SVT (supraventricular tachycardia) (H[facility])   2. Palpitations   3. Hyperlipidemia, mixed   4. Precordial pain   5. OSA on CPAP      PLAN:    SVT (supraventricular tachycardia) ([place]) - Plan: EKG 12-Lead Palpitations -She had another episode of SVT yesterday evening and has had a protracted recovery from COVID which she contracted approximately 1-1/2 months ago.  This is the likely trigger for repeat episode of SVT.  We reviewed vagal maneuvers, as needed metoprolol, and avoidance of triggers including alcohol and dehydration.  She is planning to go on a trip and I have  encouraged her to take her metoprolol and remain well-hydrated.  Chest discomfort-she had an episode of chest pressure during her SVT.  While this is not uncommon, she does have risk factors of hyperlipidemia, family history, and obesity which factor into intermediate risk for CAD.  LDL is 100.  We had originally scheduled a calcium score but with cardiovascular symptoms which may represent possible cardiac chest pain, we have participated in shared decision making and determined a coronary CTA may be more appropriate.  Will prescribe prescan metoprolol and plan for this scan when she returns from her vacation in approximately 1 month.  She is in agreement with this plan.  Hyperlipidemia, mixed - Plan: Lipid panel - last lipid panel not optimized, LDL 100, improved from prior.  ASCVD risk score is 2% in 10 years.  She has had some cardiovascular symptoms in relation to rapid rates and SVT, with this we will plan for coronary CTA and determine medication therapy afterward.  She has had a noticeable improvement in LDL through lifestyle intervention.  Daytime somnolence Snoring OSA on CPAP - severe OSA, now on CPAP nightly.    Total time of encounter: 30 minutes total time of encounter, including 20 minutes spent in face-to-face patient care on the date of this encounter. This time includes coordination of care and counseling regarding above mentioned problem list. Remainder of non-face-to-face time involved reviewing chart documents/testing relevant to the patient encounter and documentation in the medical record. I have independently reviewed documentation from referring provider.   Cherlynn Kaiser, MD, [facility]     Medication Adjustments/Labs and Tests Ordered: Current medicines are reviewed at length with the patient today.  Concerns regarding medicines are outlined above.   Orders Placed This Encounter  Procedures   CT CORONARY MORPH W/CTA COR W/SCORE W/CA W/CM  &/OR WO/CM   Basic metabolic panel   EKG [id number]     No orders of the defined types were placed in this encounter.    Patient Instructions  Medication Instructions:   PLEASE TAKE 50mg  METOPROLOL TARTRATE 2 HOURS PRIOR TO CCTA SCAN   *If you need a refill on your cardiac medications before your next appointment, please call your pharmacy*  Lab Work:  Please return for Blood Work 1 WEEK PRIOR  TO CCTA SCAN. No appointment needed, lab here at the office is open Monday-Friday from 8AM to 4PM and closed daily for lunch from 12:45-1:45.   If you have labs (blood work) drawn today and your tests are completely normal, you will receive your results only by: [place] (if you have MyChart) OR A paper copy in the mail If you have any lab test that is abnormal or we need to change your treatment, we will call you to review the results.  Testing/Procedures: Your physician has requested that you have cardiac CT. Cardiac computed tomography (CT) is a painless test that uses an x-ray machine to take clear, detailed pictures of your heart. For further information please visit [facility][email or web address]. Please follow instruction sheet as given.  Follow-Up: At [facility], you and your health needs are our priority.  As part of our continuing mission to provide you with exceptional heart care, we have created designated Provider Care Teams.  These Care Teams include your primary Cardiologist (physician) and Advanced Practice Providers (APPs -  Physician Assistants and Nurse Practitioners) who all work together to provide you with the care you need, when you need it.  Your next appointment:   NOVEMBER 3rd AT 1:40 PM  The format for your next appointment:   In Person  Provider:   Elouise Munroe, MD     Other Instructions   Your cardiac CT will be scheduled at one of the below locations:   [facility] [address] [place], [place] [address] [phone]  If  scheduled at [facility], please arrive at the [facility] and [facility]'s Entrance (Entrance C2) of [facility] 30 minutes prior to test start time. You can use the FREE valet parking offered at entrance C (encouraged to control the heart rate for the test)  Proceed to the [facility] Radiology Department (first floor) to check-in and test prep.  All radiology patients and guests should use entrance C2 at [facility], accessed from [facility], even though the hospital's physical address listed is [address].    Please follow these instructions carefully (unless otherwise directed):  On the Night Before the Test: Be sure to Drink plenty of water. Do not consume any caffeinated/decaffeinated beverages or chocolate 12 hours prior to your test. Do not take any antihistamines 12 hours prior to your test. If the patient has contrast allergy:  On the Day of the Test: Drink plenty of water until 1 hour prior to the test. Do not eat any food 4 hours prior to the test. You may take your regular medications prior to the test.  Take metoprolol (Lopressor) 50mg  two hours prior to test. HOLD Furosemide/Hydrochlorothiazide morning of the test. FEMALES- please wear underwire-free bra if available, avoid dresses & tight clothing  After the Test: Drink plenty of water. After receiving IV contrast, you may experience a mild flushed feeling. This is normal. On occasion, you may experience a mild rash up to 24 hours after the test. This is not dangerous. If this occurs, you can take Benadryl 25 mg and increase your fluid intake. If you experience trouble breathing, this can be serious. If it is severe call 911 IMMEDIATELY. If it is mild, please call our office. If you take any of these medications: Glipizide/Metformin, Avandament, Glucavance, please do not take 48 hours after completing test unless otherwise instructed.  We will call to schedule your test 2-4  weeks out understanding that some insurance  companies will need an authorization prior to the service being performed.   For non-scheduling related questions, please contact the cardiac imaging nurse navigator should you have any questions/concerns: Rockwell Alexandria, Cardiac Imaging Nurse Navigator Larey Brick, Cardiac Imaging Nurse Navigator [facility] Direct Office Dial: [phone]   For scheduling needs, including cancellations and rescheduling, please call [place], [phone].              I,Jannette Shauna Hugh Buren,acting as a scribe for [name], MD.,have documented all relevant documentation on the behalf of [name], MD,as directed by  [name], MD while in the presence of [name], MD.   I, [name], MD, have reviewed all documentation for this visit. The documentation on 06/29/22 for the exam, diagnosis, procedures, and orders are all accurate and complete.

## 2022-06-29 NOTE — Patient Instructions (Addendum)
Medication Instructions:   PLEASE TAKE 50mg  METOPROLOL TARTRATE 2 HOURS PRIOR TO CCTA SCAN   *If you need a refill on your cardiac medications before your next appointment, please call your pharmacy*  Lab Work:  Please return for Blood Work 1 WEEK PRIOR TO CCTA SCAN. No appointment needed, lab here at the office is open Monday-Friday from 8AM to 4PM and closed daily for lunch from 12:45-1:45.   If you have labs (blood work) drawn today and your tests are completely normal, you will receive your results only by: MyChart Message (if you have MyChart) OR A paper copy in the mail If you have any lab test that is abnormal or we need to change your treatment, we will call you to review the results.  Testing/Procedures: Your physician has requested that you have cardiac CT. Cardiac computed tomography (CT) is a painless test that uses an x-ray machine to take clear, detailed pictures of your heart. For further information please visit . Please follow instruction sheet as given.  Follow-Up: At [facility], you and your health needs are our priority.  As part of our continuing mission to provide you with exceptional heart care, we have created designated Provider Care Teams.  These Care Teams include your primary Cardiologist (physician) and Advanced Practice Providers (APPs -  Physician Assistants and Nurse Practitioners) who all work together to provide you with the care you need, when you need it.  Your next appointment:   NOVEMBER 2nd AT 8:20AM  The format for your next appointment:   In Person  Provider:   [id number], MD     Other Instructions   Your cardiac CT will be scheduled at one of the below locations:   [facility] [address] [place], [place] [phone]  If scheduled at [facility], please arrive at the [facility] and Children's Entrance (Entrance C2) of [facility] 30 minutes prior to test  start time. You can use the FREE valet parking offered at entrance C (encouraged to control the heart rate for the test)  Proceed to the [facility] Radiology Department (first floor) to check-in and test prep.  All radiology patients and guests should use entrance C2 at [facility], accessed from [facility], even though the hospital's physical address listed is [address]    Please follow these instructions carefully (unless otherwise directed):  On the Night Before the Test: Be sure to Drink plenty of water. Do not consume any caffeinated/decaffeinated beverages or chocolate 12 hours prior to your test. Do not take any antihistamines 12 hours prior to your test. If the patient has contrast allergy:  On the Day of the Test: Drink plenty of water until 1 hour prior to the test. Do not eat any food 4 hours prior to the test. You may take your regular medications prior to the test.  Take metoprolol (Lopressor) 50mg  two hours prior to test. HOLD Furosemide/Hydrochlorothiazide morning of the test. FEMALES- please wear underwire-free bra if available, avoid dresses & tight clothing  After the Test: Drink plenty of water. After receiving IV contrast, you may experience a mild flushed feeling. This is normal. On occasion, you may experience a mild rash up to 24 hours after the test. This is not dangerous. If this occurs, you can take Benadryl 25 mg and increase your fluid intake. If you experience trouble breathing, this can be serious. If it is severe call 911 IMMEDIATELY. If  it is mild, please call our office. If you take any of these medications: Glipizide/Metformin, Avandament, Glucavance, please do not take 48 hours after completing test unless otherwise instructed.  We will call to schedule your test 2-4 weeks out understanding that some insurance companies will need an authorization prior to the service being performed.   For non-scheduling related  questions, please contact the cardiac imaging nurse navigator should you have any questions/concerns: Rockwell Alexandria, Cardiac Imaging Nurse Navigator Larey Brick, Cardiac Imaging Nurse Navigator  Heart and Vascular Services Direct Office Dial: [phone]   For scheduling needs, including cancellations and rescheduling, please call [place], [phone].

## 2022-07-01 DIAGNOSIS — Z0289 Encounter for other administrative examinations: Secondary | ICD-10-CM

## 2022-07-24 DIAGNOSIS — G4733 Obstructive sleep apnea (adult) (pediatric): Secondary | ICD-10-CM | POA: Diagnosis not present

## 2022-08-04 ENCOUNTER — Telehealth (HOSPITAL_COMMUNITY): Payer: Self-pay | Admitting: *Deleted

## 2022-08-04 NOTE — Telephone Encounter (Signed)
Reaching out to patient to offer assistance regarding upcoming cardiac imaging study; pt verbalizes understanding of appt date/time, parking situation and where to check in,  medications ordered, and verified current allergies; name and call back number provided for further questions should they arise  Gordy Clement RN Navigator Cardiac Imaging [facility] [phone] office [phone] cell  Patient to take 50mg  metoprolol tartrate two hours prior to her cardiac CT scan. She is aware to arrive at 8:30am.

## 2022-08-05 ENCOUNTER — Encounter (HOSPITAL_COMMUNITY): Payer: Self-pay

## 2022-08-05 ENCOUNTER — Ambulatory Visit (HOSPITAL_COMMUNITY)
Admission: RE | Admit: 2022-08-05 | Discharge: 2022-08-05 | Disposition: A | Discharge status: Home / Self Care | Payer: 59 | Source: Ambulatory Visit | Attending: Internal Medicine | Admitting: Internal Medicine

## 2022-08-05 DIAGNOSIS — R072 Precordial pain: Secondary | ICD-10-CM | POA: Insufficient documentation

## 2022-08-05 MED ORDER — IOHEXOL 350 MG/ML SOLN
120.0000 mL | Freq: Once | INTRAVENOUS | Status: AC | PRN
Start: 2022-08-05 — End: 2022-08-05
  Administered 2022-08-05: 120 mL via INTRAVENOUS

## 2022-08-05 MED ORDER — METOPROLOL TARTRATE 5 MG/5ML IV SOLN
INTRAVENOUS | Status: AC
  Administered 2022-08-05: 5 mg
  Filled 2022-08-05: qty 5

## 2022-08-05 MED ORDER — NITROGLYCERIN 0.4 MG SL SUBL
SUBLINGUAL_TABLET | SUBLINGUAL | Status: AC
  Administered 2022-08-05: 0.8 mg via SUBLINGUAL
  Filled 2022-08-05: qty 2

## 2022-08-05 MED ORDER — NITROGLYCERIN 0.4 MG SL SUBL
0.8000 mg | SUBLINGUAL_TABLET | Freq: Once | SUBLINGUAL | Status: AC
Start: 2022-08-05 — End: 2022-08-05

## 2022-08-23 DIAGNOSIS — G4733 Obstructive sleep apnea (adult) (pediatric): Secondary | ICD-10-CM | POA: Diagnosis not present

## 2022-08-24 ENCOUNTER — Ambulatory Visit (INDEPENDENT_AMBULATORY_CARE_PROVIDER_SITE_OTHER): Payer: 59 | Admitting: Family Medicine

## 2022-09-02 NOTE — Progress Notes (Deleted)
Cardiology Office Note:    Date:  09/02/2022   ID:  Ann Greene, DOB Dec 14, 1965, MRN [id number]  PCP:  Monica Becton, MD  Cardiologist:  [name], MD  Electrophysiologist:  None   Referring MD: Monica Becton,*   Chief Complaint/Reason for Referral: Follow-up palpitations and daytime fatigue   History of Present Illness:    Ann Greene is a 56 y.o. female with a history of anxiety and panic disorder, hypothyroidism with a history of Hashimoto's disease, positive ANA, osteoarthritis of the right knee, hyperlipidemia, adult ADHD previously on Vyvanse, and excessive daytime sleepiness who presents for follow-up of palpitations noted to be symptomatic SVT on cardiac monitor.   Last visit, she had gone to the ER with SVT after testing positive for COVID a few weeks prior. She had tried the PRN doses of metoprolol and vagal maneuvers but was unable to break rhythm initially. She was using CPAP.  Today:  She had another episode of SVT last night. As she was falling asleep, she felt weight on her chest, and felt panic. Her husband measured her heart rate at 190 bpm. Her husband got Metoprolol for her, she put her feet up, and she began to do vagal maneuvers. Her heart rate slowly began to go down. Her heart rate was at 190 for around 15 minutes. After 30 minutes, her heart rate was back down to 90.  She contracted COVID a month and a half ago, and she had a more difficult time recovering than the last time she had COVID.   She has not had any changes in her levothyroxine dose.   She has been trying to be more active recently.  She is slightly limited by knee pain and takes occasional doses of Mobic.  She is anticipating taking a cruise with her husband in 2 weeks to [place] and will be more active than usual.  She has a history of heart disease in her family. Her father died in his 48s from heart disease. His brother also died young from heart disease. Her grandmother  also died young from heart disease.  She denies any  shortness of breath, or peripheral edema. No lightheadedness, headaches, syncope, orthopnea, or PND.     Past Medical History:  Diagnosis Date   Lichen sclerosus et atrophicus    Thyroid disease    Hashimoto's thyroiditis    Past Surgical History:  Procedure Laterality Date   CHOLECYSTECTOMY  1996   TONSILLECTOMY      Current Medications: No outpatient medications have been marked as taking for the 09/03/22 encounter (Appointment) with [name], MD.     Allergies:   Patient has no known allergies.   Social History   Tobacco Use   Smoking status: Former    Packs/day: 0.50    Types: Cigarettes    Start date: 12/03/2020    Quit date: 07/09/2021    Years since quitting: 1.1   Smokeless tobacco: Never  Substance Use Topics   Alcohol use: Yes    Alcohol/week: 0.0 standard drinks of alcohol    Comment: occasional   Drug use: No     Family History: The patient's family history includes Alcohol abuse in her brother, father, and paternal uncle; Cancer in her brother and paternal uncle; Heart attack in her father and maternal grandmother; Suicidality in her brother.  ROS:   Please see the history of present illness.     (+)Palpitations (+)Chest Discomfort  All other systems reviewed and are  negative.  EKGs/Labs/Other Studies Reviewed:    The following studies were reviewed today:  Echo 09/10/21:  IMPRESSIONS    1. Left ventricular ejection fraction, by estimation, is 60 to 65%. The  left ventricle has normal function. The left ventricle has no regional  wall motion abnormalities. Left ventricular diastolic parameters are  consistent with Grade I diastolic  dysfunction (impaired relaxation).   2. Right ventricular systolic function is normal. The right ventricular  size is normal.   3. The mitral valve is normal in structure. No evidence of mitral valve  regurgitation. No evidence of mitral stenosis.    4. The aortic valve is normal in structure. Aortic valve regurgitation is  not visualized. No aortic stenosis is present.   5. The inferior vena cava is normal in size with greater than 50%  respiratory variability, suggesting right atrial pressure of 3 mmHg.   Comparison(s): No significant change from prior study. Prior images  reviewed side by side. 03/11/20 EF 60-65%.    Echo 03/11/2020: 1. Left ventricular ejection fraction, by estimation, is 60 to 65%. The  left ventricle has normal function. The left ventricle has no regional  wall motion abnormalities. There is moderate concentric left ventricular  hypertrophy. Left ventricular  diastolic parameters were normal.   2. Right ventricular systolic function is normal. The right ventricular  size is normal. There is normal pulmonary artery systolic pressure. The  estimated right ventricular systolic pressure is 24.7 mmHg.   3. The mitral valve is normal in structure. No evidence of mitral valve  regurgitation. No evidence of mitral stenosis.   4. The aortic valve is normal in structure. Aortic valve regurgitation is  not visualized. No aortic stenosis is present.   5. The inferior vena cava is normal in size with greater than 50%  respiratory variability, suggesting right atrial pressure of 3 mmHg.   EKG:   06/29/22: NSR 09/09/21: Sinus bradycardia, rate 57 03/21/2021: NSR, rate 72, nonspecific T wave abnormality 09/16/2020: NSR, 81 bpm.   Recent Labs: 09/04/2021: Magnesium 1.9 02/27/2022: ALT 15; BUN 12; Creat 0.82; Hemoglobin 13.7; Platelets 441; Potassium 5.1; Sodium 140; TSH 2.04  Recent Lipid Panel    Component Value Date/Time   CHOL 163 02/27/2022 0000   CHOL 199 09/16/2020 0912   TRIG 71 02/27/2022 0000   HDL 47 (L) 02/27/2022 0000   HDL 51 09/16/2020 0912   CHOLHDL 3.5 02/27/2022 0000   VLDL 17 12/19/2015 0810   LDLCALC 100 (H) 02/27/2022 0000    Physical Exam:    VS:  There were no vitals taken for this visit.     Wt Readings from Last 5 Encounters:  06/29/22 (!) 331 lb 12.8 oz (150.5 kg)  03/16/22 (!) 322 lb 12.8 oz (146.4 kg)  02/27/22 (!) 323 lb 1.9 oz (146.6 kg)  09/09/21 (!) 319 lb (144.7 kg)  07/30/21 (!) 319 lb 6.4 oz (144.9 kg)    Constitutional: No acute distress Eyes: sclera non-icteric, normal conjunctiva and lids ENMT: normal dentition, moist mucous membranes Cardiovascular: regular rhythm, normal rate, no murmurs. S1 and S2 normal. Radial pulses normal bilaterally. No jugular venous distention.  Respiratory: clear to auscultation bilaterally GI : normal bowel sounds, soft and nontender. No distention.   MSK: extremities warm, well perfused. No edema.  NEURO: grossly nonfocal exam, moves all extremities. PSYCH: alert and oriented x 3, normal mood and affect.   ASSESSMENT:    No diagnosis found.    PLAN:    SVT (supraventricular tachycardia) (  [place]) - Plan: EKG 12-Lead Palpitations -She had another episode of SVT yesterday evening and has had a protracted recovery from COVID which she contracted approximately 1-1/2 months ago.  This is the likely trigger for repeat episode of SVT.  We reviewed vagal maneuvers, as needed metoprolol, and avoidance of triggers including alcohol and dehydration.  She is planning to go on a trip and I have encouraged her to take her metoprolol and remain well-hydrated.  Chest discomfort-she had an episode of chest pressure during her SVT.  While this is not uncommon, she does have risk factors of hyperlipidemia, family history, and obesity which factor into intermediate risk for CAD.  LDL is 100.  We had originally scheduled a calcium score but with cardiovascular symptoms which may represent possible cardiac chest pain, we have participated in shared decision making and determined a coronary CTA may be more appropriate.  Will prescribe prescan metoprolol and plan for this scan when she returns from her vacation in approximately 1 month.  She is in  agreement with this plan.  Hyperlipidemia, mixed - Plan: Lipid panel - last lipid panel not optimized, LDL 100, improved from prior.  ASCVD risk score is 2% in 10 years.  She has had some cardiovascular symptoms in relation to rapid rates and SVT, with this we will plan for coronary CTA and determine medication therapy afterward.  She has had a noticeable improvement in LDL through lifestyle intervention.  Daytime somnolence Snoring OSA on CPAP - severe OSA, now on CPAP nightly.    Total time of encounter: 30 minutes total time of encounter, including 20 minutes spent in face-to-face patient care on the date of this encounter. This time includes coordination of care and counseling regarding above mentioned problem list. Remainder of non-face-to-face time involved reviewing chart documents/testing relevant to the patient encounter and documentation in the medical record. I have independently reviewed documentation from referring provider.   Cherlynn Kaiser, MD, [facility]     Medication Adjustments/Labs and Tests Ordered: Current medicines are reviewed at length with the patient today.  Concerns regarding medicines are outlined above.   No orders of the defined types were placed in this encounter.    No orders of the defined types were placed in this encounter.    There are no Patient Instructions on file for this visit.

## 2022-09-03 ENCOUNTER — Encounter: Payer: Self-pay | Admitting: Internal Medicine

## 2022-09-03 ENCOUNTER — Ambulatory Visit: Payer: 59 | Attending: Internal Medicine | Admitting: Internal Medicine

## 2022-09-03 VITALS — BP 128/76 | HR 96 | Ht 69.0 in | Wt 336.0 lb

## 2022-09-03 DIAGNOSIS — Z79899 Other long term (current) drug therapy: Secondary | ICD-10-CM

## 2022-09-03 DIAGNOSIS — I471 Supraventricular tachycardia, unspecified: Secondary | ICD-10-CM | POA: Diagnosis not present

## 2022-09-03 DIAGNOSIS — G4733 Obstructive sleep apnea (adult) (pediatric): Secondary | ICD-10-CM

## 2022-09-03 DIAGNOSIS — R002 Palpitations: Secondary | ICD-10-CM | POA: Diagnosis not present

## 2022-09-03 DIAGNOSIS — E782 Mixed hyperlipidemia: Secondary | ICD-10-CM

## 2022-09-03 DIAGNOSIS — R072 Precordial pain: Secondary | ICD-10-CM

## 2022-09-03 MED ORDER — METOPROLOL TARTRATE 25 MG PO TABS: 25.0000 mg | ORAL_TABLET | Freq: Two times a day (BID) | ORAL | 3 refills | Status: DC

## 2022-09-03 NOTE — Progress Notes (Signed)
Cardiology Office Note:    Date:  09/03/2022   ID:  Ann Greene, DOB [birth year], MRN [id number]  PCP:  Monica Becton, MD  Cardiologist:  [name], MD  Electrophysiologist:  None   Referring MD: Monica Becton,*   Chief Complaint/Reason for Referral: Follow-up palpitations and daytime fatigue   History of Present Illness:    Ann Greene is a 56 y.o. female with a history of anxiety and panic disorder, hypothyroidism with a history of Hashimoto's disease, positive ANA, osteoarthritis of the right knee, hyperlipidemia, adult ADHD previously on Vyvanse, and excessive daytime sleepiness who presents for follow-up of CCTA scan  During a previous visit (09/09/2021) she had gone to the ER with SVT after testing positive for COVID a few weeks prior. She had tried the PRN doses of metoprolol and vagal maneuvers but was unable to break rhythm initially. She was using CPAP.  Last visit (06/29/22), she was still experiencing SVT extended episodes up to 190 bpm which lasted for 15 minutes. Vagal maneuvers and Metoprolol slowly controlled her episodes and during her most recent episode within 30 minutes she returned back to 90 bpm. She had not had any changes in her levothyroxine dose. She had been attempting to stay more active but had trouble due to knee pain.  Today the patient states that she has had brief episodes of SVT but hasn't had any episodes close to the intensity of previously.  She has the flu two weeks ago and has still been coughing.  She had gone to an appointment with a plastic surgeon for a breast reduction, which we discussed. She was meant to start her pre surgery weight loss clinic ([facility]) and wasn't able to due to her flu two weeks ago.   She has been taking her metoprolol and hasn't noticed any complications. She hasn't been taking any medication to manage cholesterol, we discussed diet and lifestyle to manage.  She has been  having back pain due to her weight  She has been traveling a lot recently and has been more active as a result.  We discussed her coronary CTA which was low risk, no cor cal and minimal cad.   She denies any chest pain, shortness of breath, or peripheral edema. No lightheadedness, headaches, syncope, orthopnea, or PND.  Past Medical History:  Diagnosis Date   Lichen sclerosus et atrophicus    Thyroid disease    Hashimoto's thyroiditis    Past Surgical History:  Procedure Laterality Date   CHOLECYSTECTOMY  1996   TONSILLECTOMY      Current Medications: Current Meds  Medication Sig   buPROPion (WELLBUTRIN XL) 300 MG 24 hr tablet TAKE 1 TABLET BY MOUTH EVERY DAY   Calcium Carbonate-Vitamin D 600-400 MG-UNIT tablet Take 1 tablet by mouth 2 (two) times daily.   clobetasol ointment (TEMOVATE) 0.05 % Apply 1 application topically 2 (two) times daily. Apply as directed twice daily.  After 1 month or when itching stops, decrease to nightly.   DULoxetine (CYMBALTA) 60 MG capsule TAKE 1 CAPSULE (60 MG TOTAL) BY MOUTH DAILY. DUE FOR FOLLOW UP.   levothyroxine (SYNTHROID) 150 MCG tablet Take 1 tablet (150 mcg total) by mouth daily before breakfast.   meloxicam (MOBIC) 15 MG tablet One tab PO qAM with a meal for 2 weeks, then daily prn pain.   mometasone (ELOCON) 0.1 % ointment Apply topically twice weekly.   norethindrone (MICRONOR) 0.35 MG tablet TAKE 1 TABLET BY MOUTH EVERY DAY   [  DISCONTINUED] metoprolol tartrate (LOPRESSOR) 25 MG tablet Take 1 tablet (25 mg total) by mouth 2 (two) times daily.     Allergies:   Patient has no known allergies.   Social History   Tobacco Use   Smoking status: Former    Packs/day: 0.50    Types: Cigarettes    Start date: 12/03/2020    Quit date: 07/09/2021    Years since quitting: 1.1   Smokeless tobacco: Never  Substance Use Topics   Alcohol use: Yes    Alcohol/week: 0.0 standard drinks of alcohol    Comment: occasional   Drug use: No     Family  History: The patient's family history includes Alcohol abuse in her brother, father, and paternal uncle; Cancer in her brother and paternal uncle; Heart attack in her father and maternal grandmother; Suicidality in her brother.  ROS:   Please see the history of present illness.    (+) Palpitations (+) Cough (+) Back Pain All other systems reviewed and are negative.  EKGs/Labs/Other Studies Reviewed:    The following studies were reviewed today:  Echo 09/10/21:  IMPRESSIONS    1. Left ventricular ejection fraction, by estimation, is 60 to 65%. The  left ventricle has normal function. The left ventricle has no regional  wall motion abnormalities. Left ventricular diastolic parameters are  consistent with Grade I diastolic  dysfunction (impaired relaxation).   2. Right ventricular systolic function is normal. The right ventricular  size is normal.   3. The mitral valve is normal in structure. No evidence of mitral valve  regurgitation. No evidence of mitral stenosis.   4. The aortic valve is normal in structure. Aortic valve regurgitation is  not visualized. No aortic stenosis is present.   5. The inferior vena cava is normal in size with greater than 50%  respiratory variability, suggesting right atrial pressure of 3 mmHg.   Comparison(s): No significant change from prior study. Prior images  reviewed side by side. 03/11/20 EF 60-65%.    Echo 03/11/2020: 1. Left ventricular ejection fraction, by estimation, is 60 to 65%. The  left ventricle has normal function. The left ventricle has no regional  wall motion abnormalities. There is moderate concentric left ventricular  hypertrophy. Left ventricular  diastolic parameters were normal.   2. Right ventricular systolic function is normal. The right ventricular  size is normal. There is normal pulmonary artery systolic pressure. The  estimated right ventricular systolic pressure is 43.3 mmHg.   3. The mitral valve is normal in  structure. No evidence of mitral valve  regurgitation. No evidence of mitral stenosis.   4. The aortic valve is normal in structure. Aortic valve regurgitation is  not visualized. No aortic stenosis is present.   5. The inferior vena cava is normal in size with greater than 50%  respiratory variability, suggesting right atrial pressure of 3 mmHg.   EKG:  The EKG is personally reviewed 09/03/2022: The EKG is not ordered. 06/29/22: NSR 09/09/21: Sinus bradycardia, rate 57 03/21/2021: NSR, rate 72, nonspecific T wave abnormality 09/16/2020: NSR, 81 bpm.   Recent Labs: 02/27/2022: ALT 15; BUN 12; Creat 0.82; Hemoglobin 13.7; Platelets 441; Potassium 5.1; Sodium 140; TSH 2.04  Recent Lipid Panel    Component Value Date/Time   CHOL 163 02/27/2022 0000   CHOL 199 09/16/2020 0912   TRIG 71 02/27/2022 0000   HDL 47 (L) 02/27/2022 0000   HDL 51 09/16/2020 0912   CHOLHDL 3.5 02/27/2022 0000   VLDL 17 12/19/2015  0810   LDLCALC 100 (H) 02/27/2022 0000    Physical Exam:    VS:  BP 128/76   Pulse 96   Ht 5\' 9"  (1.753 m)   Wt (!) 336 lb (152.4 kg)   SpO2 97%   BMI 49.62 kg/m     Wt Readings from Last 5 Encounters:  09/03/22 (!) 336 lb (152.4 kg)  06/29/22 (!) 331 lb 12.8 oz (150.5 kg)  03/16/22 (!) 322 lb 12.8 oz (146.4 kg)  02/27/22 (!) 323 lb 1.9 oz (146.6 kg)  09/09/21 (!) 319 lb (144.7 kg)    Constitutional: No acute distress Eyes: sclera non-icteric, normal conjunctiva and lids ENMT: normal dentition, moist mucous membranes Cardiovascular: regular rhythm, normal rate, no murmurs. S1 and S2 normal. No jugular venous distention.  Respiratory: clear to auscultation bilaterally GI : normal bowel sounds, soft and nontender. No distention.   MSK: extremities warm, well perfused. No edema.  NEURO: grossly nonfocal exam, moves all extremities. PSYCH: alert and oriented x 3, normal mood and affect.   ASSESSMENT:    1. SVT (supraventricular tachycardia)   2. Palpitations   3.  Precordial pain   4. OSA on CPAP   5. Hyperlipidemia, mixed     PLAN:    SVT (supraventricular tachycardia) (HCC) - Plan: EKG 12-Lead Palpitations Medication management - doing well on metoprolol, continue. Can take PRN metoprolol 1-2x for sustained symptoms. ED precautions reviewed.   Chest discomfort-coronary cta low risk, 0 cor cal, minimal CAD. Continue aggressive lifestyle modification.   Hyperlipidemia, mixed -  - last lipid panel not optimized, LDL 100, improved from prior.  ASCVD risk score is 2% in 10 years. OK to continue to aggressively address with lifestyle and diet. Planning to see healthy weight and wellness soon. She has had a noticeable improvement in LDL through lifestyle intervention.  Daytime somnolence Snoring OSA on CPAP - severe OSA, now on CPAP nightly.   Follow Up: 6 months  Total time of encounter: 30 minutes total time of encounter, including 20 minutes spent in face-to-face patient care on the date of this encounter. This time includes coordination of care and counseling regarding above mentioned problem list. Remainder of non-face-to-face time involved reviewing chart documents/testing relevant to the patient encounter and documentation in the medical record. I have independently reviewed documentation from referring provider.   13/08/22, MD, [facility] [place]  [facility]    Medication Adjustments/Labs and Tests Ordered: Current medicines are reviewed at length with the patient today.  Concerns regarding medicines are outlined above.   No orders of the defined types were placed in this encounter.  Meds ordered this encounter  Medications   metoprolol tartrate (LOPRESSOR) 25 MG tablet    Sig: Take 1 tablet (25 mg total) by mouth 2 (two) times daily. Okay to take an extra tablet of metoprolol once daily as needed for palpitations    Dispense:  180 tablet    Refill:  3   Patient Instructions  Medication Instructions:  OKAY TO TAKE  ADDITIONAL TABLET OF METOPROLOL ONCE DAILY AS NEEDED FOR PALPITATIONS *If you need a refill on your cardiac medications before your next appointment, please call your pharmacy*  Lab Work: None Ordered At This Time.  If you have labs (blood work) drawn today and your tests are completely normal, you will receive your results only by: MyChart Message (if you have MyChart) OR A paper copy in the mail If you have any lab test that is abnormal or we  need to change your treatment, we will call you to review the results.  Testing/Procedures: None Ordered At This Time.   Follow-Up: At [facility], you and your health needs are our priority.  As part of our continuing mission to provide you with exceptional heart care, we have created designated Provider Care Teams.  These Care Teams include your primary Cardiologist (physician) and Advanced Practice Providers (APPs -  Physician Assistants and Nurse Practitioners) who all work together to provide you with the care you need, when you need it.  Your next appointment:   6 month(s)  The format for your next appointment:   In Person  Provider:   [name], MD            I,Coren O'Brien,acting as a scribe for [name], MD.,have documented all relevant documentation on the behalf of [name], MD,as directed by  [name], MD while in the presence of [name], MD.  I, [name], MD, have reviewed all documentation for the visit on 09/03/2022. The documentation on today's date of service for the exam, diagnosis, procedures, and orders are all accurate and complete.

## 2022-09-03 NOTE — Patient Instructions (Signed)
Medication Instructions:  OKAY TO TAKE ADDITIONAL TABLET OF METOPROLOL ONCE DAILY AS NEEDED FOR PALPITATIONS *If you need a refill on your cardiac medications before your next appointment, please call your pharmacy*  Lab Work: None Ordered At This Time.  If you have labs (blood work) drawn today and your tests are completely normal, you will receive your results only by: [place] (if you have MyChart) OR A paper copy in the mail If you have any lab test that is abnormal or we need to change your treatment, we will call you to review the results.  Testing/Procedures: None Ordered At This Time.   Follow-Up: At [facility], you and your health needs are our priority.  As part of our continuing mission to provide you with exceptional heart care, we have created designated Provider Care Teams.  These Care Teams include your primary Cardiologist (physician) and Advanced Practice Providers (APPs -  Physician Assistants and Nurse Practitioners) who all work together to provide you with the care you need, when you need it.  Your next appointment:   6 month(s)  The format for your next appointment:   In Person  Provider:   Elouise Munroe, MD

## 2022-09-04 ENCOUNTER — Ambulatory Visit: Payer: 59 | Admitting: Internal Medicine

## 2022-09-07 ENCOUNTER — Ambulatory Visit (INDEPENDENT_AMBULATORY_CARE_PROVIDER_SITE_OTHER): Payer: 59 | Admitting: Family Medicine

## 2022-09-11 ENCOUNTER — Other Ambulatory Visit: Payer: Self-pay | Admitting: Sports Medicine

## 2022-09-11 DIAGNOSIS — F411 Generalized anxiety disorder: Secondary | ICD-10-CM

## 2022-09-12 ENCOUNTER — Other Ambulatory Visit: Payer: Self-pay | Admitting: Internal Medicine

## 2022-09-23 ENCOUNTER — Ambulatory Visit (INDEPENDENT_AMBULATORY_CARE_PROVIDER_SITE_OTHER): Payer: 59 | Admitting: Family Medicine

## 2022-09-23 DIAGNOSIS — G4733 Obstructive sleep apnea (adult) (pediatric): Secondary | ICD-10-CM | POA: Diagnosis not present

## 2022-09-28 ENCOUNTER — Telehealth: Payer: Self-pay | Admitting: Plastic Surgery

## 2022-09-28 NOTE — Telephone Encounter (Signed)
Called pt about missing her last two appointments with weight management. Pt stated she can't make a new appointment until she pays her late fees. Pt is unsure when she can do that. Pt needs someone to give her a call back on her next steps to move forward with her surgery. Pt stated she has finished physical therapy but has not done weight management yet. Pt asked if talking with her primary care would help. Pt was last seen by Dr. Domenica Reamer.

## 2022-10-05 ENCOUNTER — Ambulatory Visit (INDEPENDENT_AMBULATORY_CARE_PROVIDER_SITE_OTHER): Payer: 59 | Admitting: Obstetrics & Gynecology

## 2022-10-05 ENCOUNTER — Encounter (HOSPITAL_BASED_OUTPATIENT_CLINIC_OR_DEPARTMENT_OTHER): Payer: Self-pay | Admitting: Obstetrics & Gynecology

## 2022-10-05 ENCOUNTER — Ambulatory Visit (HOSPITAL_BASED_OUTPATIENT_CLINIC_OR_DEPARTMENT_OTHER)
Admission: RE | Admit: 2022-10-05 | Discharge: 2022-10-05 | Disposition: A | Discharge status: Home / Self Care | Payer: 59 | Source: Ambulatory Visit | Attending: Obstetrics & Gynecology | Admitting: Obstetrics & Gynecology

## 2022-10-05 ENCOUNTER — Other Ambulatory Visit (HOSPITAL_BASED_OUTPATIENT_CLINIC_OR_DEPARTMENT_OTHER): Payer: Self-pay | Admitting: Obstetrics & Gynecology

## 2022-10-05 VITALS — BP 136/61 | HR 61 | Ht 68.5 in | Wt 345.2 lb

## 2022-10-05 DIAGNOSIS — N943 Premenstrual tension syndrome: Secondary | ICD-10-CM

## 2022-10-05 DIAGNOSIS — Z1231 Encounter for screening mammogram for malignant neoplasm of breast: Secondary | ICD-10-CM | POA: Diagnosis not present

## 2022-10-05 DIAGNOSIS — Z8742 Personal history of other diseases of the female genital tract: Secondary | ICD-10-CM

## 2022-10-05 DIAGNOSIS — Z6841 Body Mass Index (BMI) 40.0 and over, adult: Secondary | ICD-10-CM | POA: Diagnosis not present

## 2022-10-05 DIAGNOSIS — Z01419 Encounter for gynecological examination (general) (routine) without abnormal findings: Secondary | ICD-10-CM | POA: Diagnosis not present

## 2022-10-05 DIAGNOSIS — Z1389 Encounter for screening for other disorder: Secondary | ICD-10-CM | POA: Diagnosis not present

## 2022-10-05 MED ORDER — NORETHINDRONE 0.35 MG PO TABS: 1.0000 | ORAL_TABLET | Freq: Every day | ORAL | 3 refills | Status: DC

## 2022-10-05 NOTE — Progress Notes (Unsigned)
56 y.o. G0P0000 Married White or Caucasian female here for annual exam.  Cycles have been monthly to every other month.  Flow lasta 4 days.  Pain has improved since being on the micronor.  Does have elevated PHQ-9 score today.  She had issues with getting them refilled and is out of her medications.  She's reports she does feel some of her side effects are contributing to her score as well.    She's having a lot of joint pain.  She is up about 30 pounds from last year.  Had her appointment scheduled this week.    Patient's last menstrual period was 08/16/2022 (approximate).          Sexually active: Yes.    The current method of family planning is oral progesterone-only contraceptive.    Smoker: once very blue moon  Health Maintenance: Pap:  06/12/21 neg  History of abnormal Pap:  no MMG:  06/12/21 Cologuard:  02/23/20 neg Screening Labs: done 01/2022   reports that she quit smoking about 14 months ago. Her smoking use included cigarettes. She started smoking about 22 months ago. She smoked an average of .5 packs per day. She has never used smokeless tobacco. She reports current alcohol use. She reports that she does not use drugs.  Past Medical History:  Diagnosis Date   Lichen sclerosus et atrophicus    Sleep apnea    SVT (supraventricular tachycardia)    Thyroid disease    Hashimoto's thyroiditis    Past Surgical History:  Procedure Laterality Date   CHOLECYSTECTOMY  1996   TONSILLECTOMY      Current Outpatient Medications  Medication Sig Dispense Refill   Calcium Carbonate-Vitamin D 600-400 MG-UNIT tablet Take 1 tablet by mouth 2 (two) times daily. 60 tablet 11   clobetasol ointment (TEMOVATE) 0.05 % Apply 1 application topically 2 (two) times daily. Apply as directed twice daily.  After 1 month or when itching stops, decrease to nightly. 60 g 0   levothyroxine (SYNTHROID) 150 MCG tablet Take 1 tablet (150 mcg total) by mouth daily before breakfast. 30 tablet 11   meloxicam  (MOBIC) 15 MG tablet One tab PO qAM with a meal for 2 weeks, then daily prn pain. 30 tablet 3   metoprolol tartrate (LOPRESSOR) 25 MG tablet Take 1 tablet (25 mg total) by mouth 2 (two) times daily. Okay to take an extra tablet of metoprolol once daily as needed for palpitations 180 tablet 3   mometasone (ELOCON) 0.1 % ointment Apply topically twice weekly. 45 g 3   norethindrone (MICRONOR) 0.35 MG tablet TAKE 1 TABLET BY MOUTH EVERY DAY 84 tablet 1   buPROPion (WELLBUTRIN XL) 300 MG 24 hr tablet TAKE 1 TABLET BY MOUTH EVERY DAY (Patient not taking: Reported on 10/05/2022) 90 tablet 1   DULoxetine (CYMBALTA) 60 MG capsule TAKE 1 CAPSULE (60 MG TOTAL) BY MOUTH DAILY. DUE FOR FOLLOW UP. (Patient not taking: Reported on 10/05/2022) 90 capsule 1   No current facility-administered medications for this visit.    Family History  Problem Relation Age of Onset   Alcohol abuse Father    Heart attack Father    Alcohol abuse Brother    Cancer Brother        testicular   Suicidality Brother    Alcohol abuse Paternal Uncle    Cancer Paternal Uncle        colon   Heart attack Maternal Grandmother     ROS: Constitutional: {Findings; ROS constitutional:30497::"negative"} Genitourinary:{Findings; ROS  genitourinary:19593::"negative"}  Exam:   BP 136/61   Pulse 61   Ht 5' 8.5" (1.74 m)   Wt (!) 345 lb 3.2 oz (156.6 kg)   LMP 08/16/2022 (Approximate)   BMI 51.72 kg/m   Height: 5' 8.5" (174 cm)  General appearance: alert, cooperative and appears stated age Head: Normocephalic, without obvious abnormality, atraumatic Neck: no adenopathy, supple, symmetrical, trachea midline and thyroid {EXAM; THYROID:18604} Lungs: clear to auscultation bilaterally Breasts: {Exam; breast:13139::"normal appearance, no masses or tenderness"} Heart: regular rate and rhythm Abdomen: soft, non-tender; bowel sounds normal; no masses,  no organomegaly Extremities: extremities normal, atraumatic, no cyanosis or  edema Skin: Skin color, texture, turgor normal. No rashes or lesions Lymph nodes: Cervical, supraclavicular, and axillary nodes normal. No abnormal inguinal nodes palpated Neurologic: Grossly normal   Pelvic: External genitalia:  no lesions              Urethra:  normal appearing urethra with no masses, tenderness or lesions              Bartholins and Skenes: normal                 Vagina: normal appearing vagina with normal color and no discharge, no lesions              Cervix: {exam; cervix:14595}              Pap taken: {yes no:314532} Bimanual Exam:  Uterus:  {exam; uterus:12215}              Adnexa: {exam; adnexa:12223}               Rectovaginal: Confirms               Anus:  normal sphincter tone, no lesions  Chaperone, ***, CMA, was present for exam.  Assessment/Plan:

## 2022-10-05 NOTE — Patient Instructions (Signed)
[facility]  [address] [place], [place] [address]  [phone]

## 2022-10-07 ENCOUNTER — Ambulatory Visit (INDEPENDENT_AMBULATORY_CARE_PROVIDER_SITE_OTHER): Payer: 59 | Admitting: Family Medicine

## 2022-10-12 ENCOUNTER — Encounter (HOSPITAL_BASED_OUTPATIENT_CLINIC_OR_DEPARTMENT_OTHER): Payer: Self-pay | Admitting: Obstetrics & Gynecology

## 2022-10-21 ENCOUNTER — Encounter: Payer: Self-pay | Admitting: Plastic Surgery

## 2022-10-21 ENCOUNTER — Ambulatory Visit (INDEPENDENT_AMBULATORY_CARE_PROVIDER_SITE_OTHER): Payer: No Typology Code available for payment source | Admitting: Plastic Surgery

## 2022-10-21 VITALS — BP 154/82 | HR 95 | Ht 69.0 in | Wt 350.0 lb

## 2022-10-21 DIAGNOSIS — N62 Hypertrophy of breast: Secondary | ICD-10-CM

## 2022-10-21 NOTE — Progress Notes (Signed)
Referring Provider Monica Becton, MD [address] [place],  [place] [address]   CC:  Chief Complaint  Patient presents with   Consult           Ann Greene is an 56 y.o. female.  HPI: Ann Greene is a very nice 56 year old female who returns today to discuss a bilateral breast reduction.  She was previously seen by a surgeon in our office at which time he discussed the possibility of doing a breast reduction and discussed the possibility of free nipple graft.  Ms. Lucero has unfortunately gained a small amount of weight since that time.  She is currently scheduled to see a weight loss physician tomorrow to help her with this weight gain.  No Known Allergies  Outpatient Encounter Medications as of 10/21/2022  Medication Sig   Calcium Carbonate-Vitamin D 600-400 MG-UNIT tablet Take 1 tablet by mouth 2 (two) times daily.   clobetasol ointment (TEMOVATE) 0.05 % Apply 1 application topically 2 (two) times daily. Apply as directed twice daily.  After 1 month or when itching stops, decrease to nightly.   levothyroxine (SYNTHROID) 150 MCG tablet Take 1 tablet (150 mcg total) by mouth daily before breakfast.   meloxicam (MOBIC) 15 MG tablet One tab PO qAM with a meal for 2 weeks, then daily prn pain.   metoprolol tartrate (LOPRESSOR) 25 MG tablet Take 1 tablet (25 mg total) by mouth 2 (two) times daily. Okay to take an extra tablet of metoprolol once daily as needed for palpitations   mometasone (ELOCON) 0.1 % ointment Apply topically twice weekly.   norethindrone (MICRONOR) 0.35 MG tablet Take 1 tablet (0.35 mg total) by mouth daily.   buPROPion (WELLBUTRIN XL) 300 MG 24 hr tablet TAKE 1 TABLET BY MOUTH EVERY DAY (Patient not taking: Reported on 10/05/2022)   DULoxetine (CYMBALTA) 60 MG capsule TAKE 1 CAPSULE (60 MG TOTAL) BY MOUTH DAILY. DUE FOR FOLLOW UP. (Patient not taking: Reported on 10/05/2022)   No facility-administered encounter medications on file as of 10/21/2022.      Past Medical History:  Diagnosis Date   Lichen sclerosus et atrophicus    Sleep apnea    SVT (supraventricular tachycardia)    Thyroid disease    Hashimoto's thyroiditis    Past Surgical History:  Procedure Laterality Date   CHOLECYSTECTOMY  1996   TONSILLECTOMY      Family History  Problem Relation Age of Onset   Alcohol abuse Father    Heart attack Father    Alcohol abuse Brother    Cancer Brother        testicular   Suicidality Brother    Alcohol abuse Paternal Uncle    Cancer Paternal Uncle        colon   Heart attack Maternal Grandmother     Social History   Social History Narrative   Not on file     Review of Systems General: Denies fevers, chills, weight loss CV: Denies chest pain, shortness of breath, palpitations Breast: Patient feels that her large breasts are contributing to her upper back and neck pain.  Physical Exam    10/21/2022    2:57 PM 10/05/2022    1:32 PM 09/03/2022    8:25 AM  Vitals with BMI  Height 5\' 9"  5' 8.5" 5\' 9"   Weight 350 lbs 345 lbs 3 oz 336 lbs  BMI 51.66 51.72 49.6  Systolic 154 136  Diastolic 82 61 76  Pulse 95 61  96    General:  No acute distress,  Alert and oriented, Non-Toxic, Normal speech and affect Breast: Breast not examined on this visit Mammogram: Mammogram on 10/05/2022 was BI-RADS 1 Assessment/Plan Symptomatic macromastia: The patient does indeed have very large breasts however as she is currently at a BMI of 51.  It is not reasonable to consider breast reduction at this time given the high risk of wound complications, the high risk of needing to do a free nipple graft, and the patient's overall surgical morbidity.  I discussed this with her at length.  Her to return to see me when she has achieved a BMI of 40.  At that time we will reassess and see where she is on her weight loss journey.  I would like for her to be at a stable weight somewhere below a BMI of 40 before we consider breast reduction.  She  understands this and is in agreement.  She will make an appointment to see me what her weight has decreased.  Camillia Herter 10/21/2022, 3:09 PM

## 2022-10-22 DIAGNOSIS — R030 Elevated blood-pressure reading, without diagnosis of hypertension: Secondary | ICD-10-CM | POA: Diagnosis not present

## 2022-10-22 DIAGNOSIS — R69 Illness, unspecified: Secondary | ICD-10-CM | POA: Diagnosis not present

## 2022-10-22 DIAGNOSIS — G4730 Sleep apnea, unspecified: Secondary | ICD-10-CM | POA: Diagnosis not present

## 2022-10-22 DIAGNOSIS — E282 Polycystic ovarian syndrome: Secondary | ICD-10-CM | POA: Diagnosis not present

## 2022-10-22 DIAGNOSIS — E039 Hypothyroidism, unspecified: Secondary | ICD-10-CM | POA: Diagnosis not present

## 2022-10-22 DIAGNOSIS — R7303 Prediabetes: Secondary | ICD-10-CM | POA: Diagnosis not present

## 2022-10-22 DIAGNOSIS — Z1331 Encounter for screening for depression: Secondary | ICD-10-CM | POA: Diagnosis not present

## 2022-10-22 DIAGNOSIS — Z13220 Encounter for screening for lipoid disorders: Secondary | ICD-10-CM | POA: Diagnosis not present

## 2022-10-22 DIAGNOSIS — Z6841 Body Mass Index (BMI) 40.0 and over, adult: Secondary | ICD-10-CM | POA: Diagnosis not present

## 2022-10-23 DIAGNOSIS — G4733 Obstructive sleep apnea (adult) (pediatric): Secondary | ICD-10-CM | POA: Diagnosis not present

## 2022-11-05 DIAGNOSIS — R69 Illness, unspecified: Secondary | ICD-10-CM | POA: Diagnosis not present

## 2022-11-05 DIAGNOSIS — E039 Hypothyroidism, unspecified: Secondary | ICD-10-CM | POA: Diagnosis not present

## 2022-11-05 DIAGNOSIS — E282 Polycystic ovarian syndrome: Secondary | ICD-10-CM | POA: Diagnosis not present

## 2022-11-05 DIAGNOSIS — E559 Vitamin D deficiency, unspecified: Secondary | ICD-10-CM | POA: Diagnosis not present

## 2022-11-05 DIAGNOSIS — Z6841 Body Mass Index (BMI) 40.0 and over, adult: Secondary | ICD-10-CM | POA: Diagnosis not present

## 2022-11-19 DIAGNOSIS — E039 Hypothyroidism, unspecified: Secondary | ICD-10-CM | POA: Diagnosis not present

## 2022-11-19 DIAGNOSIS — Z6841 Body Mass Index (BMI) 40.0 and over, adult: Secondary | ICD-10-CM | POA: Diagnosis not present

## 2022-11-19 DIAGNOSIS — R69 Illness, unspecified: Secondary | ICD-10-CM | POA: Diagnosis not present

## 2022-11-19 DIAGNOSIS — E282 Polycystic ovarian syndrome: Secondary | ICD-10-CM | POA: Diagnosis not present

## 2022-11-23 DIAGNOSIS — G4733 Obstructive sleep apnea (adult) (pediatric): Secondary | ICD-10-CM | POA: Diagnosis not present

## 2022-11-25 ENCOUNTER — Ambulatory Visit: Payer: No Typology Code available for payment source | Admitting: Sports Medicine

## 2022-11-25 ENCOUNTER — Encounter: Payer: Self-pay | Admitting: Sports Medicine

## 2022-11-25 VITALS — BP 136/80 | HR 81

## 2022-11-25 DIAGNOSIS — F32A Depression, unspecified: Secondary | ICD-10-CM | POA: Diagnosis not present

## 2022-11-25 DIAGNOSIS — F411 Generalized anxiety disorder: Secondary | ICD-10-CM | POA: Diagnosis not present

## 2022-11-25 DIAGNOSIS — Z1211 Encounter for screening for malignant neoplasm of colon: Secondary | ICD-10-CM | POA: Diagnosis not present

## 2022-11-25 DIAGNOSIS — Z0000 Encounter for general adult medical examination without abnormal findings: Secondary | ICD-10-CM | POA: Diagnosis not present

## 2022-11-25 DIAGNOSIS — R69 Illness, unspecified: Secondary | ICD-10-CM | POA: Diagnosis not present

## 2022-11-25 DIAGNOSIS — F419 Anxiety disorder, unspecified: Secondary | ICD-10-CM | POA: Diagnosis not present

## 2022-11-25 DIAGNOSIS — E6609 Other obesity due to excess calories: Secondary | ICD-10-CM | POA: Diagnosis not present

## 2022-11-25 DIAGNOSIS — R21 Rash and other nonspecific skin eruption: Secondary | ICD-10-CM | POA: Insufficient documentation

## 2022-11-25 MED ORDER — DULOXETINE HCL 30 MG PO CPEP: ORAL_CAPSULE | ORAL | 0 refills | Status: DC

## 2022-11-25 MED ORDER — DULOXETINE HCL 60 MG PO CPEP: 60.0000 mg | ORAL_CAPSULE | Freq: Every day | ORAL | 3 refills | Status: DC

## 2022-11-25 NOTE — Assessment & Plan Note (Signed)
Facial maculopapular rash descending downward, mild headache, no other viral symptoms, she will keep an eye on this, if he does develop any muscle aches, body aches, or upper respiratory symptoms she will go ahead and test yourself for COVID. I do think she is having a viral exanthem.

## 2022-11-25 NOTE — Assessment & Plan Note (Signed)
Patient self discontinued Cymbalta and Wellbutrin, increasing anxiety, will restart Cymbalta, holding off on Wellbutrin for now.

## 2022-11-25 NOTE — Assessment & Plan Note (Signed)
Currently working with [employer], they are trying to get her approved for Zepbound. We discussed Zepbound, [id number], and compounded semaglutide.

## 2022-11-25 NOTE — Progress Notes (Signed)
    Procedures performed today:    None.  Independent interpretation of notes and tests performed by another provider:   None.  Brief History, Exam, Impression, and Recommendations:    Anxiety and depression Patient self discontinued Cymbalta and Wellbutrin, increasing anxiety, will restart Cymbalta, holding off on Wellbutrin for now.  Obesity Currently working with [employer], they are trying to get her approved for Zepbound. We discussed Zepbound, [id number], and compounded semaglutide.  Skin rash Facial maculopapular rash descending downward, mild headache, no other viral symptoms, she will keep an eye on this, if he does develop any muscle aches, body aches, or upper respiratory symptoms she will go ahead and test yourself for COVID. I do think she is having a viral exanthem.  Chronic process with exacerbation and pharmacologic intervention  ____________________________________________ Gwen Her. Dianah Field, M.D., ABFM., CAQSM., AME. Primary Care and Sports Medicine [facility]  Adjunct Professor of [employer]  Risk manager

## 2022-12-03 DIAGNOSIS — E039 Hypothyroidism, unspecified: Secondary | ICD-10-CM | POA: Diagnosis not present

## 2022-12-03 DIAGNOSIS — E282 Polycystic ovarian syndrome: Secondary | ICD-10-CM | POA: Diagnosis not present

## 2022-12-03 DIAGNOSIS — Z6841 Body Mass Index (BMI) 40.0 and over, adult: Secondary | ICD-10-CM | POA: Diagnosis not present

## 2022-12-03 DIAGNOSIS — R69 Illness, unspecified: Secondary | ICD-10-CM | POA: Diagnosis not present

## 2022-12-17 DIAGNOSIS — E282 Polycystic ovarian syndrome: Secondary | ICD-10-CM | POA: Diagnosis not present

## 2022-12-17 DIAGNOSIS — R69 Illness, unspecified: Secondary | ICD-10-CM | POA: Diagnosis not present

## 2022-12-17 DIAGNOSIS — Z6841 Body Mass Index (BMI) 40.0 and over, adult: Secondary | ICD-10-CM | POA: Diagnosis not present

## 2022-12-17 DIAGNOSIS — E039 Hypothyroidism, unspecified: Secondary | ICD-10-CM | POA: Diagnosis not present

## 2022-12-22 DIAGNOSIS — G4733 Obstructive sleep apnea (adult) (pediatric): Secondary | ICD-10-CM | POA: Diagnosis not present

## 2022-12-31 DIAGNOSIS — E039 Hypothyroidism, unspecified: Secondary | ICD-10-CM | POA: Diagnosis not present

## 2022-12-31 DIAGNOSIS — F411 Generalized anxiety disorder: Secondary | ICD-10-CM | POA: Diagnosis not present

## 2022-12-31 DIAGNOSIS — Z6841 Body Mass Index (BMI) 40.0 and over, adult: Secondary | ICD-10-CM | POA: Diagnosis not present

## 2022-12-31 DIAGNOSIS — E282 Polycystic ovarian syndrome: Secondary | ICD-10-CM | POA: Diagnosis not present

## 2023-01-20 DIAGNOSIS — F322 Major depressive disorder, single episode, severe without psychotic features: Secondary | ICD-10-CM | POA: Diagnosis not present

## 2023-01-20 DIAGNOSIS — G4733 Obstructive sleep apnea (adult) (pediatric): Secondary | ICD-10-CM | POA: Diagnosis not present

## 2023-01-20 DIAGNOSIS — F411 Generalized anxiety disorder: Secondary | ICD-10-CM | POA: Diagnosis not present

## 2023-02-11 DIAGNOSIS — E282 Polycystic ovarian syndrome: Secondary | ICD-10-CM | POA: Diagnosis not present

## 2023-02-11 DIAGNOSIS — Z6841 Body Mass Index (BMI) 40.0 and over, adult: Secondary | ICD-10-CM | POA: Diagnosis not present

## 2023-02-11 DIAGNOSIS — F411 Generalized anxiety disorder: Secondary | ICD-10-CM | POA: Diagnosis not present

## 2023-02-12 DIAGNOSIS — E039 Hypothyroidism, unspecified: Secondary | ICD-10-CM | POA: Diagnosis not present

## 2023-02-12 DIAGNOSIS — E559 Vitamin D deficiency, unspecified: Secondary | ICD-10-CM | POA: Diagnosis not present

## 2023-02-12 DIAGNOSIS — I471 Supraventricular tachycardia, unspecified: Secondary | ICD-10-CM | POA: Diagnosis not present

## 2023-02-12 DIAGNOSIS — E282 Polycystic ovarian syndrome: Secondary | ICD-10-CM | POA: Diagnosis not present

## 2023-02-12 DIAGNOSIS — E538 Deficiency of other specified B group vitamins: Secondary | ICD-10-CM | POA: Diagnosis not present

## 2023-02-12 DIAGNOSIS — R7303 Prediabetes: Secondary | ICD-10-CM | POA: Diagnosis not present

## 2023-02-17 DIAGNOSIS — F322 Major depressive disorder, single episode, severe without psychotic features: Secondary | ICD-10-CM | POA: Diagnosis not present

## 2023-02-17 DIAGNOSIS — F411 Generalized anxiety disorder: Secondary | ICD-10-CM | POA: Diagnosis not present

## 2023-02-20 DIAGNOSIS — G4733 Obstructive sleep apnea (adult) (pediatric): Secondary | ICD-10-CM | POA: Diagnosis not present

## 2023-03-18 DIAGNOSIS — F411 Generalized anxiety disorder: Secondary | ICD-10-CM | POA: Diagnosis not present

## 2023-03-18 DIAGNOSIS — Z6841 Body Mass Index (BMI) 40.0 and over, adult: Secondary | ICD-10-CM | POA: Diagnosis not present

## 2023-03-18 DIAGNOSIS — E282 Polycystic ovarian syndrome: Secondary | ICD-10-CM | POA: Diagnosis not present

## 2023-03-22 DIAGNOSIS — G4733 Obstructive sleep apnea (adult) (pediatric): Secondary | ICD-10-CM | POA: Diagnosis not present

## 2023-04-21 DIAGNOSIS — F411 Generalized anxiety disorder: Secondary | ICD-10-CM | POA: Diagnosis not present

## 2023-04-21 DIAGNOSIS — E282 Polycystic ovarian syndrome: Secondary | ICD-10-CM | POA: Diagnosis not present

## 2023-04-21 DIAGNOSIS — Z6841 Body Mass Index (BMI) 40.0 and over, adult: Secondary | ICD-10-CM | POA: Diagnosis not present

## 2023-04-22 ENCOUNTER — Ambulatory Visit: Payer: No Typology Code available for payment source | Admitting: Sports Medicine

## 2023-04-22 ENCOUNTER — Ambulatory Visit: Payer: No Typology Code available for payment source

## 2023-04-22 DIAGNOSIS — M7752 Other enthesopathy of left foot: Secondary | ICD-10-CM | POA: Diagnosis not present

## 2023-04-22 DIAGNOSIS — M1712 Unilateral primary osteoarthritis, left knee: Secondary | ICD-10-CM | POA: Diagnosis not present

## 2023-04-22 DIAGNOSIS — S6981XA Other specified injuries of right wrist, hand and finger(s), initial encounter: Secondary | ICD-10-CM | POA: Diagnosis not present

## 2023-04-22 DIAGNOSIS — M79672 Pain in left foot: Secondary | ICD-10-CM | POA: Diagnosis not present

## 2023-04-22 DIAGNOSIS — S6991XA Unspecified injury of right wrist, hand and finger(s), initial encounter: Secondary | ICD-10-CM | POA: Diagnosis not present

## 2023-04-22 DIAGNOSIS — M7042 Prepatellar bursitis, left knee: Secondary | ICD-10-CM | POA: Diagnosis not present

## 2023-04-22 DIAGNOSIS — G4733 Obstructive sleep apnea (adult) (pediatric): Secondary | ICD-10-CM | POA: Diagnosis not present

## 2023-04-22 MED ORDER — IBUPROFEN 800 MG PO TABS: 800.0000 mg | ORAL_TABLET | Freq: Three times a day (TID) | ORAL | 2 refills | Status: DC | PRN

## 2023-04-22 NOTE — Assessment & Plan Note (Signed)
Fall about 5 weeks ago, subsequently has had increasing pain posterior aspect of the heel deep to the Achilles insertion but not directly over the Achilles, no Achilles nodule and a negative Thompson's test. Explained the anatomy and pathophysiology, will do ibuprofen 800 3 times daily, heel lifts, home conditioning, x-rays, return to see me in 4 weeks.

## 2023-04-22 NOTE — Progress Notes (Signed)
    Procedures performed today:    None.  Independent interpretation of notes and tests performed by another provider:   None.  Brief History, Exam, Impression, and Recommendations:    Retrocalcaneal bursitis (back of heel), left Fall about 5 weeks ago, subsequently has had increasing pain posterior aspect of the heel deep to the Achilles insertion but not directly over the Achilles, no Achilles nodule and a negative Thompson's test. Explained the anatomy and pathophysiology, will do ibuprofen 800 3 times daily, heel lifts, home conditioning, x-rays, return to see me in 4 weeks.  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 Return to see me in 4 weeks.  Prepatellar bursitis, left knee Impacted left anterior knee during the fall, she does have a palpable fullness in the prepatellar bursa, suspect she had a traumatic hemorrhagic prepatellar bursitis that is resolving. Adding knee x-rays, she can return to see me as needed for this.  I spent 30 minutes of total time managing this patient today, this includes chart review, face to face, and non-face to face time.  ____________________________________________ [name]. [name], M.D., ABFM., CAQSM., AME. Primary Care and Sports Medicine [facility]  Adjunct Professor of Family Medicine  [employer]  Restaurant manager, fast food

## 2023-04-22 NOTE — Assessment & Plan Note (Signed)
Impacted left anterior knee during the fall, she does have a palpable fullness in the prepatellar bursa, suspect she had a traumatic hemorrhagic prepatellar bursitis that is resolving. Adding knee x-rays, she can return to see me as needed for this.

## 2023-04-22 NOTE — Assessment & Plan Note (Signed)
During the her fall approximately 5 weeks ago she also injured her right wrist, she has pain past the tip of the ulna, she has no pain with resisted firing of the extensor carpi ulnaris tendon, she does have pain with passive ulnar deviation of the wrist raising the suspicion of a TFCC injury, she will get an over-the-counter wrist brace, adding ibuprofen, home conditioning, x-rays. Return to see me in 4 weeks.

## 2023-04-28 DIAGNOSIS — F411 Generalized anxiety disorder: Secondary | ICD-10-CM | POA: Diagnosis not present

## 2023-04-28 DIAGNOSIS — F322 Major depressive disorder, single episode, severe without psychotic features: Secondary | ICD-10-CM | POA: Diagnosis not present

## 2023-05-13 DIAGNOSIS — E282 Polycystic ovarian syndrome: Secondary | ICD-10-CM | POA: Diagnosis not present

## 2023-05-13 DIAGNOSIS — Z6841 Body Mass Index (BMI) 40.0 and over, adult: Secondary | ICD-10-CM | POA: Diagnosis not present

## 2023-05-13 DIAGNOSIS — F411 Generalized anxiety disorder: Secondary | ICD-10-CM | POA: Diagnosis not present

## 2023-05-20 ENCOUNTER — Ambulatory Visit: Payer: No Typology Code available for payment source | Admitting: Sports Medicine

## 2023-05-22 DIAGNOSIS — G4733 Obstructive sleep apnea (adult) (pediatric): Secondary | ICD-10-CM | POA: Diagnosis not present

## 2023-05-28 DIAGNOSIS — E559 Vitamin D deficiency, unspecified: Secondary | ICD-10-CM | POA: Diagnosis not present

## 2023-05-28 DIAGNOSIS — E039 Hypothyroidism, unspecified: Secondary | ICD-10-CM | POA: Diagnosis not present

## 2023-05-28 DIAGNOSIS — E282 Polycystic ovarian syndrome: Secondary | ICD-10-CM | POA: Diagnosis not present

## 2023-05-28 DIAGNOSIS — R7303 Prediabetes: Secondary | ICD-10-CM | POA: Diagnosis not present

## 2023-06-11 DIAGNOSIS — Z1211 Encounter for screening for malignant neoplasm of colon: Secondary | ICD-10-CM | POA: Diagnosis not present

## 2023-06-17 LAB — COLOGUARD: COLOGUARD: NEGATIVE

## 2023-06-21 DIAGNOSIS — G4733 Obstructive sleep apnea (adult) (pediatric): Secondary | ICD-10-CM | POA: Diagnosis not present

## 2023-06-30 ENCOUNTER — Other Ambulatory Visit (HOSPITAL_BASED_OUTPATIENT_CLINIC_OR_DEPARTMENT_OTHER): Payer: Self-pay

## 2023-07-06 ENCOUNTER — Other Ambulatory Visit (HOSPITAL_BASED_OUTPATIENT_CLINIC_OR_DEPARTMENT_OTHER): Payer: Self-pay

## 2023-07-06 MED ORDER — TIRZEPATIDE-WEIGHT MANAGEMENT 10 MG/0.5ML ~~LOC~~ SOAJ
10.0000 mg | SUBCUTANEOUS | 0 refills | Status: DC
  Filled 2023-07-06: qty 2, 28d supply, fill #0

## 2023-07-06 MED ORDER — ZEPBOUND 7.5 MG/0.5ML ~~LOC~~ SOAJ
7.5000 mg | SUBCUTANEOUS | 0 refills | Status: DC
  Filled 2023-07-06: qty 2, 28d supply, fill #0

## 2023-07-21 ENCOUNTER — Other Ambulatory Visit (HOSPITAL_BASED_OUTPATIENT_CLINIC_OR_DEPARTMENT_OTHER): Payer: Self-pay

## 2023-07-21 MED ORDER — ZEPBOUND 10 MG/0.5ML ~~LOC~~ SOAJ
10.0000 mg | SUBCUTANEOUS | 0 refills | Status: DC
  Filled 2023-07-21: qty 2, 28d supply, fill #0

## 2023-07-22 DIAGNOSIS — G4733 Obstructive sleep apnea (adult) (pediatric): Secondary | ICD-10-CM | POA: Diagnosis not present

## 2023-07-27 DIAGNOSIS — F411 Generalized anxiety disorder: Secondary | ICD-10-CM | POA: Diagnosis not present

## 2023-07-27 DIAGNOSIS — F332 Major depressive disorder, recurrent severe without psychotic features: Secondary | ICD-10-CM | POA: Diagnosis not present

## 2023-08-09 DIAGNOSIS — F332 Major depressive disorder, recurrent severe without psychotic features: Secondary | ICD-10-CM | POA: Diagnosis not present

## 2023-08-10 ENCOUNTER — Other Ambulatory Visit (HOSPITAL_BASED_OUTPATIENT_CLINIC_OR_DEPARTMENT_OTHER): Payer: Self-pay

## 2023-08-10 MED ORDER — ZEPBOUND 12.5 MG/0.5ML ~~LOC~~ SOAJ
12.5000 mg | SUBCUTANEOUS | 0 refills | Status: DC
  Filled 2023-08-10: qty 2, 28d supply, fill #0

## 2023-08-20 ENCOUNTER — Other Ambulatory Visit (HOSPITAL_BASED_OUTPATIENT_CLINIC_OR_DEPARTMENT_OTHER): Payer: Self-pay

## 2023-08-20 MED ORDER — LEVOTHYROXINE SODIUM 150 MCG PO TABS
150.0000 ug | ORAL_TABLET | ORAL | 1 refills | Status: DC
  Filled 2023-08-20: qty 90, 90d supply, fill #0
  Filled 2023-11-16: qty 90, 90d supply, fill #1

## 2023-08-21 DIAGNOSIS — G4733 Obstructive sleep apnea (adult) (pediatric): Secondary | ICD-10-CM | POA: Diagnosis not present

## 2023-09-01 ENCOUNTER — Other Ambulatory Visit (HOSPITAL_BASED_OUTPATIENT_CLINIC_OR_DEPARTMENT_OTHER): Payer: Self-pay

## 2023-09-01 MED ORDER — ZEPBOUND 12.5 MG/0.5ML ~~LOC~~ SOAJ
12.5000 mg | SUBCUTANEOUS | 0 refills | Status: DC
  Filled 2023-09-16: qty 2, 28d supply, fill #0

## 2023-09-07 ENCOUNTER — Other Ambulatory Visit (HOSPITAL_BASED_OUTPATIENT_CLINIC_OR_DEPARTMENT_OTHER): Payer: Self-pay

## 2023-09-16 ENCOUNTER — Other Ambulatory Visit (HOSPITAL_BASED_OUTPATIENT_CLINIC_OR_DEPARTMENT_OTHER): Payer: Self-pay

## 2023-09-16 ENCOUNTER — Other Ambulatory Visit: Payer: Self-pay

## 2023-09-20 DIAGNOSIS — G4733 Obstructive sleep apnea (adult) (pediatric): Secondary | ICD-10-CM | POA: Diagnosis not present

## 2023-09-28 ENCOUNTER — Other Ambulatory Visit (HOSPITAL_BASED_OUTPATIENT_CLINIC_OR_DEPARTMENT_OTHER): Payer: Self-pay

## 2023-09-28 MED ORDER — ZEPBOUND 12.5 MG/0.5ML ~~LOC~~ SOAJ
12.5000 mg | SUBCUTANEOUS | 1 refills | Status: DC
  Filled 2023-09-28 – 2023-10-09 (×2): qty 2, 28d supply, fill #0

## 2023-10-09 ENCOUNTER — Other Ambulatory Visit (HOSPITAL_BASED_OUTPATIENT_CLINIC_OR_DEPARTMENT_OTHER): Payer: Self-pay

## 2023-10-19 ENCOUNTER — Other Ambulatory Visit (HOSPITAL_BASED_OUTPATIENT_CLINIC_OR_DEPARTMENT_OTHER): Payer: Self-pay | Admitting: Obstetrics & Gynecology

## 2023-10-19 DIAGNOSIS — N943 Premenstrual tension syndrome: Secondary | ICD-10-CM

## 2023-10-20 DIAGNOSIS — G4733 Obstructive sleep apnea (adult) (pediatric): Secondary | ICD-10-CM | POA: Diagnosis not present

## 2023-10-25 ENCOUNTER — Other Ambulatory Visit (HOSPITAL_COMMUNITY)
Admission: RE | Admit: 2023-10-25 | Discharge: 2023-10-25 | Disposition: A | Discharge status: Home / Self Care | Payer: No Typology Code available for payment source | Source: Ambulatory Visit | Attending: Obstetrics & Gynecology | Admitting: Obstetrics & Gynecology

## 2023-10-25 ENCOUNTER — Encounter (HOSPITAL_BASED_OUTPATIENT_CLINIC_OR_DEPARTMENT_OTHER): Payer: Self-pay | Admitting: Obstetrics & Gynecology

## 2023-10-25 ENCOUNTER — Ambulatory Visit (INDEPENDENT_AMBULATORY_CARE_PROVIDER_SITE_OTHER): Payer: No Typology Code available for payment source | Admitting: Obstetrics & Gynecology

## 2023-10-25 VITALS — BP 121/67 | HR 69 | Ht 68.5 in | Wt 316.6 lb

## 2023-10-25 DIAGNOSIS — Z124 Encounter for screening for malignant neoplasm of cervix: Secondary | ICD-10-CM | POA: Insufficient documentation

## 2023-10-25 DIAGNOSIS — Z01419 Encounter for gynecological examination (general) (routine) without abnormal findings: Secondary | ICD-10-CM | POA: Diagnosis not present

## 2023-10-25 DIAGNOSIS — N943 Premenstrual tension syndrome: Secondary | ICD-10-CM | POA: Diagnosis not present

## 2023-10-25 DIAGNOSIS — E282 Polycystic ovarian syndrome: Secondary | ICD-10-CM | POA: Diagnosis not present

## 2023-10-25 DIAGNOSIS — N926 Irregular menstruation, unspecified: Secondary | ICD-10-CM

## 2023-10-25 DIAGNOSIS — Z1231 Encounter for screening mammogram for malignant neoplasm of breast: Secondary | ICD-10-CM | POA: Diagnosis not present

## 2023-10-25 DIAGNOSIS — E669 Obesity, unspecified: Secondary | ICD-10-CM | POA: Diagnosis not present

## 2023-10-25 NOTE — Progress Notes (Signed)
57 y.o. G0P0000 Married White or Caucasian female here for annual exam.  On micronor but last cycle was in June.    Is going to [employer] on [address].  On Zepbound.  Has lost weight.  Does have knee and hip pain issues.  Weight loss hasn't really helped this.    Patient's last menstrual period was 04/03/2023 (approximate).           Smoker:  no  Health Maintenance: Pap:  06/2021 History of abnormal Pap:  no MMG:  10/06/2022 Colonoscopy:  02/23/2020, 06/17/2023 Screening Labs: does with PCP   reports that she quit smoking about 2 years ago. Her smoking use included cigarettes. She started smoking about 2 years ago. She has a 0.3 pack-year smoking history. She has never used smokeless tobacco. She reports current alcohol use. She reports that she does not use drugs.  Past Medical History:  Diagnosis Date   Lichen sclerosus et atrophicus    Sleep apnea    SVT (supraventricular tachycardia) (HCC)    Thyroid disease    Hashimoto's thyroiditis    Past Surgical History:  Procedure Laterality Date   CHOLECYSTECTOMY  1996   TONSILLECTOMY      Current Outpatient Medications  Medication Sig Dispense Refill   clobetasol ointment (TEMOVATE) 0.05 % Apply 1 application topically 2 (two) times daily. Apply as directed twice daily.  After 1 month or when itching stops, decrease to nightly. 60 g 0   DULoxetine (CYMBALTA) 60 MG capsule Take 1 capsule (60 mg total) by mouth daily. Due for follow up. 90 capsule 3   ibuprofen (ADVIL) 800 MG tablet Take 1 tablet (800 mg total) by mouth every 8 (eight) hours as needed. 90 tablet 2   levothyroxine (SYNTHROID) 150 MCG tablet Take 1 tablet (150 mcg total) by mouth daily before breakfast. 30 tablet 11   levothyroxine (SYNTHROID) 150 MCG tablet Take 1 tablet (150 mcg total) by mouth every morning on an empty stomach. 90 tablet 1   mometasone (ELOCON) 0.1 % ointment Apply topically twice weekly. 45 g 3   norethindrone (MICRONOR) 0.35 MG tablet TAKE 1 TABLET  BY MOUTH EVERY DAY 84 tablet 0   tirzepatide (ZEPBOUND) 12.5 MG/0.5ML Pen Inject 12.5 mg into the skin once a week. 2 mL 1   Calcium Carbonate-Vitamin D 600-400 MG-UNIT tablet Take 1 tablet by mouth 2 (two) times daily. 60 tablet 11   metoprolol tartrate (LOPRESSOR) 25 MG tablet Take 1 tablet (25 mg total) by mouth 2 (two) times daily. Okay to take an extra tablet of metoprolol once daily as needed for palpitations 180 tablet 3   No current facility-administered medications for this visit.    Family History  Problem Relation Age of Onset   Alcohol abuse Father    Heart attack Father    Alcohol abuse Brother    Cancer Brother        testicular   Suicidality Brother    Alcohol abuse Paternal Uncle    Cancer Paternal Uncle        colon   Heart attack Maternal Grandmother     ROS: Constitutional: negative Genitourinary:negative  Exam:   BP 121/67 (BP Location: Right Arm, Patient Position: Sitting, Cuff Size: Large)   Pulse 69   Ht 5' 8.5" (1.74 m)   Wt (!) 316 lb 9.6 oz (143.6 kg)   LMP 04/03/2023 (Approximate)   BMI 47.44 kg/m   Height: 5' 8.5" (174 cm)  General appearance: alert, cooperative and appears stated  age Head: Normocephalic, without obvious abnormality, atraumatic Neck: no adenopathy, supple, symmetrical, trachea midline and thyroid normal to inspection and palpation Lungs: clear to auscultation bilaterally Breasts: normal appearance, no masses or tenderness Heart: regular rate and rhythm Abdomen: soft, non-tender; bowel sounds normal; no masses,  no organomegaly Extremities: extremities normal, atraumatic, no cyanosis or edema Skin: Skin color, texture, turgor normal. No rashes or lesions Lymph nodes: Cervical, supraclavicular, and axillary nodes normal. No abnormal inguinal nodes palpated Neurologic: Grossly normal   Pelvic: External genitalia:  no lesions              Urethra:  normal appearing urethra with no masses, tenderness or lesions               Bartholins and Skenes: normal                 Vagina: normal appearing vagina with normal color and no discharge, no lesions              Cervix: no lesions              Pap taken: Yes.   Bimanual Exam:  Uterus:  normal size, contour, position, consistency, mobility, non-tender              Adnexa: normal adnexa and no mass, fullness, tenderness               Rectovaginal: Confirms               Anus:  normal sphincter tone, no lesions  Chaperone, Ina Homes, CMA, was present for exam.  Assessment/Plan: 1. Well woman exam with routine gynecological exam (Primary) - Pap smear obtained today per pt request - Mammogram 10/2022.  Order placed - Colonoscopy declined.  She did a cologuard earlier this year. - lab work done with PCP - vaccines reviewed/updated.  Tdap is due.  She knows to update this is has any exposure.  2. Cervical cancer screening - Cytology - PAP( [place])  3. Encounter for screening mammogram for malignant neoplasm of breast - MM 3D SCREENING MAMMOGRAM BILATERAL BREAST; Future  4. Irregular periods/menstrual cycles - Follicle stimulating hormone - RF for micronor given  5. PMS (premenstrual syndrome)  6. PCOS (polycystic ovarian syndrome)

## 2023-10-25 NOTE — Patient Instructions (Addendum)
Call [phone] to schedule an appointment at [facility].    Call [phone] to scheduled at the [facility], [address] [place]  [place], [place] [address]  Mammograms can also be self-scheduled online through [place].

## 2023-10-26 LAB — FOLLICLE STIMULATING HORMONE: FSH: 16.6 m[IU]/mL

## 2023-10-26 LAB — CYTOLOGY - PAP
Adequacy: ABSENT
Diagnosis: NEGATIVE

## 2023-11-01 ENCOUNTER — Other Ambulatory Visit (HOSPITAL_BASED_OUTPATIENT_CLINIC_OR_DEPARTMENT_OTHER): Payer: Self-pay | Admitting: Obstetrics & Gynecology

## 2023-11-01 ENCOUNTER — Telehealth (HOSPITAL_BASED_OUTPATIENT_CLINIC_OR_DEPARTMENT_OTHER): Payer: Self-pay | Admitting: Obstetrics & Gynecology

## 2023-11-01 DIAGNOSIS — N943 Premenstrual tension syndrome: Secondary | ICD-10-CM

## 2023-11-01 MED ORDER — NORETHINDRONE 0.35 MG PO TABS: 1.0000 | ORAL_TABLET | Freq: Every day | ORAL | 3 refills | Status: AC

## 2023-11-02 ENCOUNTER — Encounter (HOSPITAL_BASED_OUTPATIENT_CLINIC_OR_DEPARTMENT_OTHER): Payer: Self-pay | Admitting: Radiology

## 2023-11-02 ENCOUNTER — Ambulatory Visit (HOSPITAL_BASED_OUTPATIENT_CLINIC_OR_DEPARTMENT_OTHER)
Admission: RE | Admit: 2023-11-02 | Discharge: 2023-11-02 | Disposition: A | Discharge status: Home / Self Care | Payer: No Typology Code available for payment source | Source: Ambulatory Visit | Attending: Obstetrics & Gynecology | Admitting: Obstetrics & Gynecology

## 2023-11-02 ENCOUNTER — Other Ambulatory Visit (HOSPITAL_BASED_OUTPATIENT_CLINIC_OR_DEPARTMENT_OTHER): Payer: Self-pay

## 2023-11-02 DIAGNOSIS — Z1231 Encounter for screening mammogram for malignant neoplasm of breast: Secondary | ICD-10-CM | POA: Diagnosis not present

## 2023-11-02 MED ORDER — ZEPBOUND 12.5 MG/0.5ML ~~LOC~~ SOAJ
12.5000 mg | SUBCUTANEOUS | 1 refills | Status: DC
  Filled 2023-11-02 (×3): qty 2, 28d supply, fill #0

## 2023-11-16 ENCOUNTER — Other Ambulatory Visit: Payer: Self-pay

## 2023-11-20 DIAGNOSIS — G4733 Obstructive sleep apnea (adult) (pediatric): Secondary | ICD-10-CM | POA: Diagnosis not present

## 2023-11-25 DIAGNOSIS — E669 Obesity, unspecified: Secondary | ICD-10-CM | POA: Diagnosis not present

## 2023-12-06 ENCOUNTER — Ambulatory Visit: Payer: Self-pay | Admitting: Sports Medicine

## 2023-12-06 NOTE — Telephone Encounter (Signed)
  Chief Complaint: right knee pain Symptoms: pain when walking Frequency: constant   Disposition: [] ED /[] Urgent Care (no appt availability in office) / [] Appointment(In office/virtual)/ []  [facility]/ [] Home Care/ [] Refused Recommended Disposition /[] [place] [facility]/ []  Follow-up with PCP Additional Notes: Pt complaining of worsening right knee pain. Pt has a torn meniscus, but pain is increasing to whole knee know. Pt states the knee is making a popping noise when walking.  Pt is returning from vacation when she walked a lot. Pt states pain is currently a 4/10, but has been resting and elevating it today.  Unable to schedule pt due to PCP request. RN called [facility] but no. Answer. RN told pt office would reach out tomorrow (2/4) to schedule pt. RN gave care advice and pt verbalized understanding.              Copied from CRM [id number]. Topic: Clinical - Red Word Triage >> Dec 06, 2023  4:31 PM Dennison Nancy wrote: Red Word that prompted transfer to Nurse Triage:  ongoing right knee issues , swelling and pain , new issue on left side Reason for Disposition  [1] MODERATE pain (e.g., interferes with normal activities, limping) AND [2] present > 3 days  Answer Assessment - Initial Assessment Questions 1. LOCATION and RADIATION: "Where is the pain located?"      Right knee-both sides  2. QUALITY: "What does the pain feel like?"  (e.g., sharp, dull, aching, burning)     Sharp at times  3. SEVERITY: "How bad is the pain?" "What does it keep you from doing?"   (Scale 1-10; or mild, moderate, severe)   -  MILD (1-3): doesn't interfere with normal activities    -  MODERATE (4-7): interferes with normal activities (e.g., work or school) or awakens from sleep, limping    -  SEVERE (8-10): excruciating pain, unable to do any normal activities, unable to walk     4 4. ONSET: "When did the pain start?" "Does it come and go, or is it there all the time?"     Popping around a month and  a half ago. 5. RECURRENT: "Have you had this pain before?" If Yes, ask: "When, and what happened then?"     Yes, around a year ago  6. SETTING: "Has there been any recent work, exercise or other activity that involved that part of the body?"      Lots of walking on vacation 7. AGGRAVATING FACTORS: "What makes the knee pain worse?" (e.g., walking, climbing stairs, running)     Walking, climbing 8. ASSOCIATED SYMPTOMS: "Is there any swelling or redness of the knee?"     Swelling  9. OTHER SYMPTOMS: "Do you have any other symptoms?" (e.g., chest pain, difficulty breathing, fever, calf pain)     Denies  Protocols used: Knee Pain-A-AH

## 2023-12-08 NOTE — Telephone Encounter (Signed)
 Patient has been contacted and scheduled

## 2023-12-10 ENCOUNTER — Ambulatory Visit (INDEPENDENT_AMBULATORY_CARE_PROVIDER_SITE_OTHER): Payer: No Typology Code available for payment source

## 2023-12-10 ENCOUNTER — Other Ambulatory Visit (HOSPITAL_BASED_OUTPATIENT_CLINIC_OR_DEPARTMENT_OTHER): Payer: Self-pay

## 2023-12-10 ENCOUNTER — Ambulatory Visit: Payer: No Typology Code available for payment source | Admitting: Sports Medicine

## 2023-12-10 DIAGNOSIS — E6609 Other obesity due to excess calories: Secondary | ICD-10-CM | POA: Diagnosis not present

## 2023-12-10 DIAGNOSIS — R7303 Prediabetes: Secondary | ICD-10-CM | POA: Diagnosis not present

## 2023-12-10 DIAGNOSIS — Z0000 Encounter for general adult medical examination without abnormal findings: Secondary | ICD-10-CM

## 2023-12-10 DIAGNOSIS — F411 Generalized anxiety disorder: Secondary | ICD-10-CM

## 2023-12-10 DIAGNOSIS — Z23 Encounter for immunization: Secondary | ICD-10-CM | POA: Diagnosis not present

## 2023-12-10 DIAGNOSIS — M25561 Pain in right knee: Secondary | ICD-10-CM | POA: Diagnosis not present

## 2023-12-10 DIAGNOSIS — F32A Depression, unspecified: Secondary | ICD-10-CM

## 2023-12-10 DIAGNOSIS — M170 Bilateral primary osteoarthritis of knee: Secondary | ICD-10-CM

## 2023-12-10 DIAGNOSIS — M25562 Pain in left knee: Secondary | ICD-10-CM | POA: Diagnosis not present

## 2023-12-10 DIAGNOSIS — F419 Anxiety disorder, unspecified: Secondary | ICD-10-CM | POA: Diagnosis not present

## 2023-12-10 MED ORDER — TRIAMCINOLONE ACETONIDE 40 MG/ML IJ SUSP
40.0000 mg | Freq: Once | INTRAMUSCULAR | Status: AC
  Administered 2023-12-10: 40 mg via INTRAMUSCULAR

## 2023-12-10 MED ORDER — ZEPBOUND 15 MG/0.5ML ~~LOC~~ SOAJ
15.0000 mg | SUBCUTANEOUS | 0 refills | Status: DC
  Filled 2023-12-10: qty 2, 28d supply, fill #0

## 2023-12-10 MED ORDER — DULOXETINE HCL 60 MG PO CPEP: 60.0000 mg | ORAL_CAPSULE | Freq: Every day | ORAL | 3 refills | Status: AC

## 2023-12-10 NOTE — Assessment & Plan Note (Addendum)
 Due for Shingrix, Tdap, we can discuss this at a future visit, deferred per patient request. Ann Greene has had labs at an outside facility, [facility], she will upload them for me.

## 2023-12-10 NOTE — Code Documentation (Signed)
 done

## 2023-12-10 NOTE — Assessment & Plan Note (Signed)
 Very pleasant 58 year old female, we treated her in the past for bilateral knee osteoarthritis, she is now having recurrence of pain, right knee, she also gets mechanical symptoms such as popping, catching, locking. She has a trip coming up to [facility] in a couple of weeks, we injected her right knee today, I would like updated x-rays and due to her mechanical symptoms we will proceed with MRI of the right knee. I do suspect she has a degenerative meniscal tear. We will actually hold off on Visco approval, if she has a large meniscal tear I think it would need to be debrided arthroscopically before we do viscosupplementation.  For insurance coverage purposes she is having a lateral joint line pain with catching, locking, clicking, a positive McMurray's sign all concerning for a meniscal tear.

## 2023-12-10 NOTE — Addendum Note (Signed)
 Addended by: Arvel Lather A on: 12/10/2023 04:24 PM   Modules accepted: Orders

## 2023-12-10 NOTE — Assessment & Plan Note (Signed)
 A1c noted elevated on outside labs, but not in diabetic range.

## 2023-12-10 NOTE — Assessment & Plan Note (Signed)
 Currently starting 15 mg of Zepbound , and working with a weight loss program. No changes.

## 2023-12-10 NOTE — Progress Notes (Signed)
    Procedures performed today:    Procedure: Real-time Ultrasound Guided injection of the right knee Device: Samsung HS60  Verbal informed consent obtained.  Time-out conducted.  Noted no overlying erythema, induration, or other signs of local infection.  Skin prepped in a sterile fashion.  Local anesthesia: Topical Ethyl chloride.  With sterile technique and under real time ultrasound guidance: No effusion noted, 1 cc Kenalog  40, 2 cc lidocaine, 2 cc bupivacaine injected easily Completed without difficulty  Advised to call if fevers/chills, erythema, induration, drainage, or persistent bleeding.  Images permanently stored and available for review in PACS.  Impression: Technically successful ultrasound guided injection.  Independent interpretation of notes and tests performed by another provider:   None.  Brief History, Exam, Impression, and Recommendations:    Primary osteoarthritis of both knees Very pleasant 58 year old female, we treated her in the past for bilateral knee osteoarthritis, she is now having recurrence of pain, right knee, she also gets mechanical symptoms such as popping, catching, locking. She has a trip coming up to [facility] in a couple of weeks, we injected her right knee today, I would like updated x-rays and due to her mechanical symptoms we will proceed with MRI of the right knee. I do suspect she has a degenerative meniscal tear. We will actually hold off on Visco approval, if she has a large meniscal tear I think it would need to be debrided arthroscopically before we do viscosupplementation.  For insurance coverage purposes she is having a lateral joint line pain with catching, locking, clicking, a positive McMurray's sign all concerning for a meniscal tear.   Obesity Currently starting 15 mg of Zepbound , and working with a weight loss program. No changes.  Annual physical exam Due for Shingrix, Tdap, we can discuss this at a future visit, deferred  per patient request. Geofm has had labs at an outside facility, [place], she will upload them for me.  Prediabetes A1c noted elevated on outside labs, but not in diabetic range.    ____________________________________________ Debby PARAS. Curtis, M.D., ABFM., CAQSM., AME. Primary Care and Sports Medicine [facility]  Adjunct Professor of [facility] [employer]  [employer]  Restaurant Manager, [employer]

## 2023-12-21 ENCOUNTER — Other Ambulatory Visit: Payer: No Typology Code available for payment source

## 2023-12-26 DIAGNOSIS — E669 Obesity, unspecified: Secondary | ICD-10-CM | POA: Diagnosis not present

## 2024-01-02 ENCOUNTER — Ambulatory Visit (HOSPITAL_BASED_OUTPATIENT_CLINIC_OR_DEPARTMENT_OTHER)
Admission: RE | Admit: 2024-01-02 | Discharge: 2024-01-02 | Disposition: A | Discharge status: Home / Self Care | Payer: No Typology Code available for payment source | Source: Ambulatory Visit | Attending: Sports Medicine | Admitting: Sports Medicine

## 2024-01-02 DIAGNOSIS — M170 Bilateral primary osteoarthritis of knee: Secondary | ICD-10-CM | POA: Insufficient documentation

## 2024-01-02 DIAGNOSIS — S83231A Complex tear of medial meniscus, current injury, right knee, initial encounter: Secondary | ICD-10-CM | POA: Diagnosis not present

## 2024-01-02 DIAGNOSIS — R609 Edema, unspecified: Secondary | ICD-10-CM | POA: Diagnosis not present

## 2024-01-05 ENCOUNTER — Other Ambulatory Visit: Payer: Self-pay

## 2024-01-05 ENCOUNTER — Other Ambulatory Visit (HOSPITAL_BASED_OUTPATIENT_CLINIC_OR_DEPARTMENT_OTHER): Payer: Self-pay

## 2024-01-05 MED ORDER — ZEPBOUND 15 MG/0.5ML ~~LOC~~ SOAJ
15.0000 mg | SUBCUTANEOUS | 0 refills | Status: DC
  Filled 2024-01-05: qty 2, 28d supply, fill #0

## 2024-01-06 ENCOUNTER — Other Ambulatory Visit: Payer: Self-pay | Admitting: Internal Medicine

## 2024-01-07 ENCOUNTER — Telehealth: Payer: Self-pay | Admitting: Internal Medicine

## 2024-01-07 ENCOUNTER — Other Ambulatory Visit (HOSPITAL_BASED_OUTPATIENT_CLINIC_OR_DEPARTMENT_OTHER): Payer: Self-pay

## 2024-01-07 MED ORDER — METOPROLOL TARTRATE 25 MG PO TABS
25.0000 mg | ORAL_TABLET | Freq: Two times a day (BID) | ORAL | 0 refills | Status: DC
  Filled 2024-01-07: qty 75, 38d supply, fill #0

## 2024-01-07 NOTE — Telephone Encounter (Signed)
 Pt's medication was sent to pt's pharmacy as requested. Confirmation received.

## 2024-01-07 NOTE — Telephone Encounter (Signed)
*  STAT* If patient is at the pharmacy, call can be transferred to refill team.   1. Which medications need to be refilled? (please list name of each medication and dose if known) metoprolol tartrate (LOPRESSOR) 25 MG tablet (Expired)   2. Which pharmacy/location (including street and city if local pharmacy) is medication to be sent to?  [facility]   3. Do they need a 30 day or 90 day supply? 90

## 2024-01-21 ENCOUNTER — Encounter: Payer: Self-pay | Admitting: Sports Medicine

## 2024-01-21 ENCOUNTER — Ambulatory Visit: Payer: No Typology Code available for payment source | Admitting: Sports Medicine

## 2024-01-21 ENCOUNTER — Telehealth: Payer: Self-pay | Admitting: Sports Medicine

## 2024-01-21 DIAGNOSIS — M170 Bilateral primary osteoarthritis of knee: Secondary | ICD-10-CM | POA: Diagnosis not present

## 2024-01-21 DIAGNOSIS — M7752 Other enthesopathy of left foot: Secondary | ICD-10-CM | POA: Diagnosis not present

## 2024-01-21 MED ORDER — IBUPROFEN 800 MG PO TABS: 800.0000 mg | ORAL_TABLET | Freq: Three times a day (TID) | ORAL | 2 refills | Status: AC | PRN

## 2024-01-21 NOTE — Progress Notes (Signed)
    Procedures performed today:    None.  Independent interpretation of notes and tests performed by another provider:   None.  Brief History, Exam, Impression, and Recommendations:    Primary osteoarthritis of both knees Osteoarthritis both knees, she has had multiple treatments in the past including NSAIDs, steroid injections, physical therapy, x-ray confirmed arthritis, MRI also confirmed meniscal tearing and severe osteoarthritis, she did not respond to a recent steroid injection per Proceeding with Visco approval, we also discussed geniculate artery embolization. Refilling ibuprofen production return to see me to start Visco when approved.    ____________________________________________ [name]. [name], M.D., ABFM., CAQSM., AME. Primary Care and Sports Medicine [facility]  Adjunct Professor of Family Medicine  [place] [employer] [facility] [employer]  Restaurant manager, fast food

## 2024-01-21 NOTE — Assessment & Plan Note (Signed)
 Osteoarthritis both knees, she has had multiple treatments in the past including NSAIDs, steroid injections, physical therapy, x-ray confirmed arthritis, MRI also confirmed meniscal tearing and severe osteoarthritis, she did not respond to a recent steroid injection per Proceeding with Visco approval, we also discussed geniculate artery embolization. Refilling ibuprofen production return to see me to start Visco when approved.

## 2024-01-21 NOTE — Telephone Encounter (Signed)
 Visco approval, bilateral please.

## 2024-01-23 DIAGNOSIS — E669 Obesity, unspecified: Secondary | ICD-10-CM | POA: Diagnosis not present

## 2024-01-24 ENCOUNTER — Other Ambulatory Visit (HOSPITAL_BASED_OUTPATIENT_CLINIC_OR_DEPARTMENT_OTHER): Payer: Self-pay

## 2024-01-24 MED ORDER — ZEPBOUND 15 MG/0.5ML ~~LOC~~ SOAJ
15.0000 mg | SUBCUTANEOUS | 0 refills | Status: DC
  Filled 2024-01-24 – 2024-01-31 (×2): qty 2, 28d supply, fill #0

## 2024-01-30 NOTE — Progress Notes (Unsigned)
 Cardiology Office Note    Date:  02/01/2024  ID:  Ann Greene, DOB Mar 08, 1966, MRN [id number] PCP:  Monica Becton, MD  Cardiologist:  [name], MD  Electrophysiologist:  None   Chief Complaint: Follow up for SVT   History of Present Illness: Ann Greene    Ann Greene is a 58 y.o. female with visit-pertinent history of SVT, anxiety and panic disorder, hypothyroidism with a history of Hashimoto's disease, positive ANA, osteoarthritis of the right knee, hyperlipidemia, adult ADHD previously on Vyvanse and excessive daytime sleepiness.  In 2022 patient presented to the ER with SVT  after testing positive for COVID a few weeks prior.  She had tried as needed doses of metoprolol and vagal neighbors but was unable to break the rhythm initially.  Coronary CTA in 08/2022 indicated a coronary calcium score of 0.  Patient was last seen in clinic in 09/2022, patient noted that she continued had brief episodes of SVT, she had been taking metoprolol as needed and had not noticed any complications.  Today she presents for follow-up.  She reports that she has been doing well. Denies any recent heart palpitations.  She has not required any extra doses of her metoprolol.  She was recently started on Zepbound, has started exercising and is a member of [employer].  She is planning to start water aerobics class soon.  She denies any chest pain, shortness of breath, lower extremity edema, orthopnea or PND.  She denies any presyncope or syncope.  ROS: .   Today she denies chest pain, shortness of breath, lower extremity edema, fatigue, palpitations, melena, hematuria, hemoptysis, diaphoresis, weakness, presyncope, syncope, orthopnea, and PND.  All other systems are reviewed and otherwise negative. Studies Reviewed: Ann Greene    EKG:  EKG is ordered today, personally reviewed, demonstrating  EKG Interpretation Date/Time:  Monday January 31 2024 13:35:09 EDT Ventricular Rate:  69 PR Interval:  134 QRS  Duration:  80 QT Interval:  382 QTC Calculation: 409 R Axis:   62  Text Interpretation: Normal sinus rhythm Nonspecific T wave abnormality Confirmed by Reather Littler [phone]) on 01/31/2024 1:55:49 PM   CV Studies: Cardiac studies reviewed are outlined and summarized above. Otherwise please see EMR for full report. Cardiac Studies & Procedures   ______________________________________________________________________________________________   STRESS TESTS  EXERCISE TOLERANCE TEST (ETT) 12/20/2015  Narrative  Baseline EKG showed nonspecific ST/T wave abnormality. There was no ST segment deviation noted during stress.  Blood pressure demonstrated a hypertensive response to exercise.  Negative ETT for ischemia at adequate heart rate. No chest pain noted.  Mildly reduced exercise capacity.  Occasional PACs noted during exercise.   ECHOCARDIOGRAM  ECHOCARDIOGRAM COMPLETE 09/10/2021  Narrative ECHOCARDIOGRAM REPORT    Patient Name:   Ann Greene    Date of Exam: 09/10/2021 Medical Rec #:  [id number]     Height:       69.0 in Accession #:    [id number]    Weight:       319.0 lb Date of Birth:  06/17/1966      BSA:          2.519 m Patient Age:    55 years      BP:           132/82 mmHg Patient Gender: F             HR:           62 bpm. Exam Location:  [address]  Procedure: 2D Echo,  3D Echo, Cardiac Doppler, Color Doppler and Strain Analysis  Indications:    I47.1 SVT  History:        Patient has prior history of Echocardiogram examinations, most recent 03/11/2020. Arrythmias:SVT; Risk Factors:Sleep Apnea, Dyslipidemia, Former Smoker and Obesity.  Sonographer:    Jorje Guild [facility], RDCS Referring Phys: [id number] [name]  IMPRESSIONS   1. Left ventricular ejection fraction, by estimation, is 60 to 65%. The left ventricle has normal function. The left ventricle has no regional wall motion abnormalities. Left ventricular diastolic parameters are consistent with Grade I  diastolic dysfunction (impaired relaxation). 2. Right ventricular systolic function is normal. The right ventricular size is normal. 3. The mitral valve is normal in structure. No evidence of mitral valve regurgitation. No evidence of mitral stenosis. 4. The aortic valve is normal in structure. Aortic valve regurgitation is not visualized. No aortic stenosis is present. 5. The inferior vena cava is normal in size with greater than 50% respiratory variability, suggesting right atrial pressure of 3 mmHg.  Comparison(s): No significant change from prior study. Prior images reviewed side by side. 03/11/20 EF 60-65%.  FINDINGS Left Ventricle: Left ventricular ejection fraction, by estimation, is 60 to 65%. The left ventricle has normal function. The left ventricle has no regional wall motion abnormalities. The left ventricular internal cavity size was normal in size. There is no left ventricular hypertrophy. Left ventricular diastolic parameters are consistent with Grade I diastolic dysfunction (impaired relaxation).  Right Ventricle: The right ventricular size is normal. No increase in right ventricular wall thickness. Right ventricular systolic function is normal.  Left Atrium: Left atrial size was normal in size.  Right Atrium: Right atrial size was normal in size.  Pericardium: There is no evidence of pericardial effusion.  Mitral Valve: The mitral valve is normal in structure. No evidence of mitral valve regurgitation. No evidence of mitral valve stenosis.  Tricuspid Valve: The tricuspid valve is normal in structure. Tricuspid valve regurgitation is not demonstrated. No evidence of tricuspid stenosis.  Aortic Valve: The aortic valve is normal in structure. Aortic valve regurgitation is not visualized. No aortic stenosis is present.  Pulmonic Valve: The pulmonic valve was normal in structure. Pulmonic valve regurgitation is not visualized. No evidence of pulmonic stenosis.  Aorta: The  aortic root is normal in size and structure.  Venous: The inferior vena cava is normal in size with greater than 50% respiratory variability, suggesting right atrial pressure of 3 mmHg.  IAS/Shunts: No atrial level shunt detected by color flow Doppler.   LEFT VENTRICLE PLAX 2D LVIDd:         5.30 cm   Diastology LVIDs:         3.70 cm   LV e' medial:    7.81 cm/s LV PW:         1.10 cm   LV E/e' medial:  10.7 LV IVS:        1.10 cm   LV e' lateral:   11.20 cm/s LVOT diam:     2.30 cm   LV E/e' lateral: 7.5 LV SV:         109 LV SV Index:   43        2D Longitudinal Strain LVOT Area:     4.15 cm  2D Strain GLS (A2C):   -23.1 % 2D Strain GLS (A3C):   -23.5 % 2D Strain GLS (A4C):   -19.7 % 2D Strain GLS Avg:     -22.1 %  3D Volume  EF: 3D EF:        62 % LV EDV:       106 ml LV ESV:       40 ml LV SV:        66 ml  RIGHT VENTRICLE             IVC RV Basal diam:  3.50 cm     IVC diam: 1.50 cm RV S prime:     11.30 cm/s TAPSE (M-mode): 2.2 cm  LEFT ATRIUM             Index        RIGHT ATRIUM           Index LA diam:        4.20 cm 1.67 cm/m   RA Pressure: 3.00 mmHg LA Vol (A2C):   54.8 ml 21.75 ml/m  RA Area:     15.10 cm LA Vol (A4C):   59.9 ml 23.78 ml/m  RA Volume:   40.70 ml  16.15 ml/m LA Biplane Vol: 57.9 ml 22.98 ml/m AORTIC VALVE LVOT Vmax:   128.00 cm/s LVOT Vmean:  81.800 cm/s LVOT VTI:    0.263 m  AORTA Ao Root diam: 3.60 cm Ao Asc diam:  3.85 cm  MITRAL VALVE               TRICUSPID VALVE Estimated RAP:  3.00 mmHg MV Decel Time: 239 msec MV E velocity: 83.80 cm/s  SHUNTS MV A velocity: 67.60 cm/s  Systemic VTI:  0.26 m MV E/A ratio:  1.24        Systemic Diam: 2.30 cm  Donato Schultz MD Electronically signed by Donato Schultz MD Signature Date/Time: 09/10/2021/3:35:30 PM    Final    MONITORS  LONG TERM MONITOR (3-14 DAYS) 02/22/2020  Narrative Indication: palpitations  Minimum HR (bpm): 51 Maximum HR (bpm): 193  Supraventricular Ectopy:  rare <1% SVT: 8 episodes of SVT, fastest 9 beats at 193 bpm. SVT vs atrial tachycardia.  Ventricular Ectopy: rare <1% NSVT: none Ventricular Tachycardia: none  Pauses: none AV block: none  Atrial fibrillation: none  Diary events: diary events of SOB, pounding, lightheaded, fluttering, anxious correlate with episodes of SVT and supraventricular ectopic beats  Impression symptoms correlate with SVT and SVE, brief episodes of SVT.   CT SCANS  CT CORONARY MORPH W/CTA COR W/SCORE 08/05/2022  Addendum 08/05/2022  3:48 PM ADDENDUM REPORT: 08/05/2022 15:46  CLINICAL DATA:  This over-read does not include interpretation of cardiac or coronary anatomy or pathology. The coronary CTA interpretation by the cardiologist is attached.  COMPARISON:  None available.  FINDINGS: No suspicious nodules, masses, or infiltrates are identified in the visualized portion of the lungs. No pleural fluid seen.  The visualized portions of the mediastinum and chest wall are unremarkable.  IMPRESSION: No significant non-cardiac abnormality identified.   Electronically Signed By: Danae Orleans M.D. On: 08/05/2022 15:46  Narrative CLINICAL DATA:  This is a 58 year old female with anginal symptoms.  EXAM: Cardiac/Coronary  CTA  TECHNIQUE: The patient was scanned on a [employer].  FINDINGS: A 100 kV prospective scan was triggered in the descending thoracic aorta at 111 HU's. Axial non-contrast 3 mm slices were carried out through the heart. The data set was analyzed on a dedicated work station and scored using the Agatson method. Gantry rotation speed was 250 msecs and collimation was .6 mm. No beta blockade and 0.8 mg of sl NTG was given. The 3D  data set was reconstructed in 5% intervals of the 67-82 % of the R-R cycle. Diastolic phases were analyzed on a dedicated work station using MPR, MIP and VRT modes. The patient received 80 cc of contrast.  Aorta: Normal size.  No  calcifications.  No dissection.  Aortic Valve:  Trileaflet.  No calcifications.  Coronary Arteries:  Normal coronary origin.  Right dominance.  RCA is a large dominant artery that gives rise to PDA and PLA. There is no plaque.  Left main is a large artery that gives rise to LAD and LCX arteries.  LAD is a large vessel that has no plaque.  LCX is a non-dominant artery that gives rise to one large OM1 branch. There is minimal (<24%) soft plaque in the proximal LCX. OM1 with no plaques.  Coronary Calcium Score:  Left main: 0  Left anterior descending artery: 0  Left circumflex artery: 0  Right coronary artery: 0  Total: 0  Percentile: 0  Other findings:  Normal pulmonary vein drainage into the left atrium.  Normal left atrial appendage without a thrombus.  Normal size of the pulmonary artery.  IMPRESSION: 1. Coronary calcium score of 0. This was 0 percentile for age and sex matched control.  2. Normal coronary origin with right dominance.  3. CAD-RADS 1. Minimal non-obstructive CAD (0-24%). Consider non-atherosclerotic causes of chest pain. Consider preventive therapy and risk factor modification.  The noncardiac portion of this study will be interpreted in separate report by the radiologist.  Electronically Signed: By: Thomasene Ripple D.O. On: 08/05/2022 13:18     ______________________________________________________________________________________________       Current Reported Medications:.    Current Meds  Medication Sig   Calcium Carbonate-Vitamin D 600-400 MG-UNIT tablet Take 1 tablet by mouth 2 (two) times daily.   clobetasol ointment (TEMOVATE) 0.05 % Apply 1 application topically 2 (two) times daily. Apply as directed twice daily.  After 1 month or when itching stops, decrease to nightly.   DULoxetine (CYMBALTA) 60 MG capsule Take 1 capsule (60 mg total) by mouth daily. Due for follow up.   ibuprofen (ADVIL) 800 MG tablet Take 1 tablet (800 mg  total) by mouth every 8 (eight) hours as needed.   levothyroxine (SYNTHROID) 150 MCG tablet Take 1 tablet (150 mcg total) by mouth every morning on an empty stomach.   mometasone (ELOCON) 0.1 % ointment Apply topically twice weekly.   norethindrone (MICRONOR) 0.35 MG tablet Take 1 tablet (0.35 mg total) by mouth daily.   tirzepatide (ZEPBOUND) 15 MG/0.5ML Pen Inject 15 mg into the skin once a week.   [DISCONTINUED] metoprolol tartrate (LOPRESSOR) 25 MG tablet Take 1 tablet (25 mg total) by mouth 2 (two) times daily. Okay to take an extra tablet of metoprolol once daily as needed for palpitations   [DISCONTINUED] tirzepatide (ZEPBOUND) 15 MG/0.5ML Pen Inject 15 mg into the skin once a week.    Physical Exam:    VS:  BP 112/72   Pulse 72   Ht 5\' 9"  (1.753 m)   Wt 293 lb 9.6 oz (133.2 kg)   SpO2 95%   BMI 43.36 kg/m    Wt Readings from Last 3 Encounters:  01/31/24 293 lb 9.6 oz (133.2 kg)  10/25/23 (!) 316 lb 9.6 oz (143.6 kg)  10/21/22 (!) 350 lb (158.8 kg)    GEN: Well nourished, well developed in no acute distress NECK: No JVD; No carotid bruits CARDIAC: RRR, no murmurs, rubs, gallops RESPIRATORY:  Clear to auscultation without  rales, wheezing or rhonchi  ABDOMEN: Soft, non-tender, non-distended EXTREMITIES:  No edema; No acute deformity     Asessement and Plan:.    SVT/Palpitations: Patient with history of SVT, patient reports that her palpitations have been very well-controlled recently.  She denies any significant increases in palpitations, has not required any increased doses of metoprolol.  Reviewed ED precautions.  Continue metoprolol tartrate 25 mg twice daily.  Refill of metoprolol provided.  Hyperlipidemia: Coronary CTA in 08/2022 indicated coronary calcium score of 0.  Patient reports her last lipid profile was well controlled, encouraged patient to continue with heart healthy diet and increased exercise.   OSA: Noted to have severe OSA, she reports that she is using her  CPAP nightly.   Disposition: F/u with Dr. Jacques Navy in one year or sooner if needed.   Signed, Rip Harbour, NP

## 2024-01-31 ENCOUNTER — Other Ambulatory Visit (HOSPITAL_BASED_OUTPATIENT_CLINIC_OR_DEPARTMENT_OTHER): Payer: Self-pay

## 2024-01-31 ENCOUNTER — Ambulatory Visit: Attending: Cardiology | Admitting: Cardiology

## 2024-01-31 VITALS — BP 112/72 | HR 72 | Ht 69.0 in | Wt 293.6 lb

## 2024-01-31 DIAGNOSIS — I471 Supraventricular tachycardia, unspecified: Secondary | ICD-10-CM | POA: Diagnosis not present

## 2024-01-31 DIAGNOSIS — E782 Mixed hyperlipidemia: Secondary | ICD-10-CM

## 2024-01-31 DIAGNOSIS — R002 Palpitations: Secondary | ICD-10-CM | POA: Diagnosis not present

## 2024-01-31 DIAGNOSIS — G4733 Obstructive sleep apnea (adult) (pediatric): Secondary | ICD-10-CM

## 2024-01-31 MED ORDER — METOPROLOL TARTRATE 25 MG PO TABS
25.0000 mg | ORAL_TABLET | Freq: Two times a day (BID) | ORAL | 1 refills | Status: AC
  Filled 2024-01-31 – 2024-02-07 (×2): qty 75, 38d supply, fill #0

## 2024-01-31 NOTE — Patient Instructions (Signed)
 Medication Instructions:  No Changes *If you need a refill on your cardiac medications before your next appointment, please call your pharmacy*  Lab Work: No Labs If you have labs (blood work) drawn today and your tests are completely normal, you will receive your results only by: MyChart Message (if you have MyChart) OR A paper copy in the mail If you have any lab test that is abnormal or we need to change your treatment, we will call you to review the results.  Testing/Procedures: No Testing  Follow-Up: At [facility], you and your health needs are our priority.  As part of our continuing mission to provide you with exceptional heart care, our providers are all part of one team.  This team includes your primary Cardiologist (physician) and Advanced Practice Providers or APPs (Physician Assistants and Nurse Practitioners) who all work together to provide you with the care you need, when you need it.  Your next appointment:   1 year(s)  Provider:   [name], MD   We recommend signing up for the patient portal called "MyChart".  Sign up information is provided on this After Visit Summary.  MyChart is used to connect with patients for Virtual Visits (Telemedicine).  Patients are able to view lab/test results, encounter notes, upcoming appointments, etc.  Non-urgent messages can be sent to your provider as well.   To learn more about what you can do with MyChart, go to [email or web address].   Other Instructions       1st Floor: - Lobby - Registration  - Pharmacy  - Lab - Cafe  2nd Floor: - PV Lab - Diagnostic Testing (echo, CT, nuclear med)  3rd Floor: - Vacant  4th Floor: - TCTS (cardiothoracic surgery) - AFib Clinic - Structural Heart Clinic - Vascular Surgery  - Vascular Ultrasound  5th Floor: - HeartCare Cardiology (general and EP) - Clinical Pharmacy for coumadin, hypertension, lipid, weight-loss medications, and med management  appointments    Valet parking services will be available as well.

## 2024-02-01 ENCOUNTER — Encounter: Payer: Self-pay | Admitting: Cardiology

## 2024-02-03 NOTE — Telephone Encounter (Addendum)
 Benefits Investigation Details received from [employer]: Orthovisc PA required: Yes May fill through: Buy and Bill  OV Copay/Coinsurance: $60 Product Copay: 0% Administration Coinsurance: 0% Administration Copay: % Deductible: $250 (Met:$250) Individual $500 (Met $250) Family  Benefits Investigation started 02/03/2024  Deductible applies. Both the drug and the procedure are covered at 100%. If OV  is billed, a copay will apply. Once the OOP has been met, patient is covered at 100%.  Prior Authorization is required for the drug through [employer].   Prior Authorization has been approved and patient has been advised.

## 2024-02-04 ENCOUNTER — Other Ambulatory Visit (HOSPITAL_BASED_OUTPATIENT_CLINIC_OR_DEPARTMENT_OTHER): Payer: Self-pay

## 2024-02-07 ENCOUNTER — Other Ambulatory Visit (HOSPITAL_BASED_OUTPATIENT_CLINIC_OR_DEPARTMENT_OTHER): Payer: Self-pay

## 2024-02-12 ENCOUNTER — Other Ambulatory Visit (HOSPITAL_BASED_OUTPATIENT_CLINIC_OR_DEPARTMENT_OTHER): Payer: Self-pay

## 2024-02-14 ENCOUNTER — Encounter (HOSPITAL_BASED_OUTPATIENT_CLINIC_OR_DEPARTMENT_OTHER): Payer: Self-pay

## 2024-02-14 ENCOUNTER — Other Ambulatory Visit (HOSPITAL_BASED_OUTPATIENT_CLINIC_OR_DEPARTMENT_OTHER): Payer: Self-pay

## 2024-02-15 ENCOUNTER — Other Ambulatory Visit (HOSPITAL_BASED_OUTPATIENT_CLINIC_OR_DEPARTMENT_OTHER): Payer: Self-pay

## 2024-02-15 MED ORDER — LEVOTHYROXINE SODIUM 150 MCG PO TABS
150.0000 ug | ORAL_TABLET | Freq: Every morning | ORAL | 0 refills | Status: DC
Start: 2024-02-15 — End: 2024-03-21
  Filled 2024-02-15: qty 30, 30d supply, fill #0

## 2024-02-17 ENCOUNTER — Telehealth: Payer: Self-pay

## 2024-02-17 NOTE — Telephone Encounter (Signed)
 Is this prior authorization regarding Orthovisc?

## 2024-02-17 NOTE — Telephone Encounter (Signed)
 Copied from CRM [id number]. Topic: Referral - Prior Authorization Question >> Feb 17, 2024 11:54 AM Ann Greene H wrote: Reason for CRM: The rendering provider information is incomplete so therefore the  prior authorization will be canceled. A new prior authorization needs to be resubmitted with the rendering or facilities information.   Fax: [phone]  Call back number: [phone]

## 2024-02-22 ENCOUNTER — Other Ambulatory Visit (HOSPITAL_BASED_OUTPATIENT_CLINIC_OR_DEPARTMENT_OTHER): Payer: Self-pay

## 2024-02-22 MED ORDER — ZEPBOUND 15 MG/0.5ML ~~LOC~~ SOAJ
15.0000 mg | SUBCUTANEOUS | 0 refills | Status: DC
Start: 2024-02-21 — End: 2024-03-22
  Filled 2024-02-22: qty 2, 28d supply, fill #0

## 2024-02-23 DIAGNOSIS — E669 Obesity, unspecified: Secondary | ICD-10-CM | POA: Diagnosis not present

## 2024-02-23 NOTE — Telephone Encounter (Signed)
 PA submitted to [employer] via fax.

## 2024-02-24 ENCOUNTER — Other Ambulatory Visit (HOSPITAL_COMMUNITY): Payer: Self-pay

## 2024-02-24 NOTE — Telephone Encounter (Signed)
 Yes, Prior Authorization is regarding Orthovisc. We have spoken to [facility] and Prior Authorization has been submitted.

## 2024-02-28 ENCOUNTER — Other Ambulatory Visit (HOSPITAL_COMMUNITY): Payer: Self-pay

## 2024-03-13 ENCOUNTER — Ambulatory Visit: Admitting: Sports Medicine

## 2024-03-13 ENCOUNTER — Encounter: Payer: Self-pay | Admitting: Sports Medicine

## 2024-03-13 ENCOUNTER — Other Ambulatory Visit: Payer: Self-pay

## 2024-03-13 DIAGNOSIS — M170 Bilateral primary osteoarthritis of knee: Secondary | ICD-10-CM

## 2024-03-13 MED ORDER — HYALURONAN 30 MG/2ML IX SOSY
15.0000 mg | PREFILLED_SYRINGE | Freq: Once | INTRA_ARTICULAR | Status: AC
Start: 2024-03-13 — End: 2024-03-13
  Administered 2024-03-13: 15 mg via INTRA_ARTICULAR

## 2024-03-13 NOTE — Progress Notes (Signed)
    Procedures performed today:    Procedure: Real-time Ultrasound Guided injection of the right knee Device: Samsung HS60  Verbal informed consent obtained.  Time-out conducted.  Noted no overlying erythema, induration, or other signs of local infection.  Skin prepped in a sterile fashion.  Local anesthesia: Topical Ethyl chloride.  With sterile technique and under real time ultrasound guidance: 30 mg/2 mL of OrthoVisc (sodium hyaluronate) in a prefilled syringe was injected easily into the knee through a 22-gauge needle. Completed without difficulty  Advised to call if fevers/chills, erythema, induration, drainage, or persistent bleeding.  Images permanently stored and available for review in PACS.  Impression: Technically successful ultrasound guided injection.  Independent interpretation of notes and tests performed by another provider:   None.  Brief History, Exam, Impression, and Recommendations:    Primary osteoarthritis of both knees Bilateral knee osteoarthritis, has had multiple treatments including NSAIDs, steroid injections, PT, MRI did confirm meniscal tearing and severe osteoarthritis. Today we did Orthovisc No. 1 of 4 right knee, return in 1 week for #2 of 4 right knee.    ____________________________________________ Joselyn Nicely. Sandy Crumb, M.D., ABFM., CAQSM., AME. Primary Care and Sports Medicine [facility]  Adjunct Professor of [facility] Medicine  [employer]  Restaurant manager, fast food

## 2024-03-13 NOTE — Assessment & Plan Note (Signed)
 Bilateral knee osteoarthritis, has had multiple treatments including NSAIDs, steroid injections, PT, MRI did confirm meniscal tearing and severe osteoarthritis. Today we did Orthovisc No. 1 of 4 right knee, return in 1 week for #2 of 4 right knee.

## 2024-03-20 ENCOUNTER — Other Ambulatory Visit: Payer: Self-pay

## 2024-03-20 ENCOUNTER — Ambulatory Visit: Admitting: Sports Medicine

## 2024-03-20 DIAGNOSIS — E034 Atrophy of thyroid (acquired): Secondary | ICD-10-CM | POA: Diagnosis not present

## 2024-03-20 DIAGNOSIS — M170 Bilateral primary osteoarthritis of knee: Secondary | ICD-10-CM

## 2024-03-20 DIAGNOSIS — G4733 Obstructive sleep apnea (adult) (pediatric): Secondary | ICD-10-CM | POA: Diagnosis not present

## 2024-03-20 DIAGNOSIS — Z0000 Encounter for general adult medical examination without abnormal findings: Secondary | ICD-10-CM

## 2024-03-20 MED ORDER — HYALURONAN 30 MG/2ML IX SOSY
15.0000 mg | PREFILLED_SYRINGE | Freq: Once | INTRA_ARTICULAR | Status: AC
  Administered 2024-03-20: 15 mg via INTRA_ARTICULAR

## 2024-03-20 NOTE — Assessment & Plan Note (Signed)
 Bilateral knee osteoarthritis, Orthovisc No. 2 of 4 right knee, return in 1 week for #3 of 4

## 2024-03-20 NOTE — Assessment & Plan Note (Addendum)
 Currently on 150 mcg levothyroxine  prescribed by an endocrinologist, I am happy to take this over, we will check her TSH today since it has been a long time and dose based on a goal TSH of less than 2.5. She has about 6 days left of levothyroxine  so soon as I see the results we will need to refill it.  TSH is low, we do need to add T3 and T4 levels to the blood in the lab, decrease levothyroxine  to 137 mcg, and recheck all labs in 6 weeks.

## 2024-03-20 NOTE — Progress Notes (Addendum)
    Procedures performed today:    Procedure: Real-time Ultrasound Guided injection of the right knee Device: Samsung HS60  Verbal informed consent obtained.  Time-out conducted.  Noted no overlying erythema, induration, or other signs of local infection.  Skin prepped in a sterile fashion.  Local anesthesia: Topical Ethyl chloride.  With sterile technique and under real time ultrasound guidance: 30 mg/2 mL of OrthoVisc (sodium hyaluronate) in a prefilled syringe was injected easily into the knee through a 22-gauge needle. Completed without difficulty  Advised to call if fevers/chills, erythema, induration, drainage, or persistent bleeding.  Images permanently stored and available for review in PACS.  Impression: Technically successful ultrasound guided injection.  Independent interpretation of notes and tests performed by another provider:   None.  Brief History, Exam, Impression, and Recommendations:    Primary osteoarthritis of both knees Bilateral knee osteoarthritis, Orthovisc No. 2 of 4 right knee, return in 1 week for #3 of 4  Hypothyroidism Currently on 150 mcg levothyroxine  prescribed by an endocrinologist, I am happy to take this over, we will check her TSH today since it has been a long time and dose based on a goal TSH of less than 2.5. She has about 6 days left of levothyroxine  so soon as I see the results we will need to refill it.  TSH is low, we do need to add T3 and T4 levels to the blood in the lab, decrease levothyroxine  to 137 mcg, and recheck all labs in 6 weeks.    ____________________________________________ Joselyn Nicely. Sandy Crumb, M.D., ABFM., CAQSM., AME. Primary Care and Sports Medicine [facility]  Adjunct Professor of [facility] Medicine  [employer]  Restaurant manager, fast food

## 2024-03-21 ENCOUNTER — Ambulatory Visit: Payer: Self-pay | Admitting: Sports Medicine

## 2024-03-21 LAB — CBC
Hematocrit: 43.8 % (ref 34.0–46.6)
Hemoglobin: 14 g/dL (ref 11.1–15.9)
MCH: 27.8 pg (ref 26.6–33.0)
MCHC: 32 g/dL (ref 31.5–35.7)
MCV: 87 fL (ref 79–97)
Platelets: 398 10*3/uL (ref 150–450)
RBC: 5.03 x10E6/uL (ref 3.77–5.28)
RDW: 13.5 % (ref 11.7–15.4)
WBC: 12.6 10*3/uL — ABNORMAL HIGH (ref 3.4–10.8)

## 2024-03-21 LAB — TSH: TSH: 0.348 u[IU]/mL — ABNORMAL LOW (ref 0.450–4.500)

## 2024-03-21 MED ORDER — LEVOTHYROXINE SODIUM 137 MCG PO TABS: 137.0000 ug | ORAL_TABLET | Freq: Every morning | ORAL | 11 refills | Status: AC

## 2024-03-21 NOTE — Addendum Note (Signed)
 Addended by: Gean Keels on: 03/21/2024 09:03 AM   Modules accepted: Orders

## 2024-03-22 ENCOUNTER — Other Ambulatory Visit (HOSPITAL_BASED_OUTPATIENT_CLINIC_OR_DEPARTMENT_OTHER): Payer: Self-pay

## 2024-03-22 LAB — SPECIMEN STATUS REPORT

## 2024-03-22 LAB — T4, FREE: Free T4: 1.67 ng/dL (ref 0.82–1.77)

## 2024-03-22 LAB — T3, FREE: T3, Free: 2.6 pg/mL (ref 2.0–4.4)

## 2024-03-22 MED ORDER — ZEPBOUND 15 MG/0.5ML ~~LOC~~ SOAJ
15.0000 mg | SUBCUTANEOUS | 0 refills | Status: AC
Start: 2024-03-22 — End: ?
  Filled 2024-03-22: qty 2, 28d supply, fill #0

## 2024-03-24 DIAGNOSIS — E669 Obesity, unspecified: Secondary | ICD-10-CM | POA: Diagnosis not present

## 2024-03-28 ENCOUNTER — Ambulatory Visit: Admitting: Sports Medicine

## 2024-03-28 ENCOUNTER — Other Ambulatory Visit (INDEPENDENT_AMBULATORY_CARE_PROVIDER_SITE_OTHER)

## 2024-03-28 DIAGNOSIS — M170 Bilateral primary osteoarthritis of knee: Secondary | ICD-10-CM

## 2024-03-28 MED ORDER — HYALURONAN 30 MG/2ML IX SOSY
15.0000 mg | PREFILLED_SYRINGE | Freq: Once | INTRA_ARTICULAR | Status: AC
  Administered 2024-03-28: 15 mg via INTRA_ARTICULAR

## 2024-03-28 NOTE — Progress Notes (Signed)
    Procedures performed today:    Procedure: Real-time Ultrasound Guided injection of the right knee Device: Samsung HS60  Verbal informed consent obtained.  Time-out conducted.  Noted no overlying erythema, induration, or other signs of local infection.  Skin prepped in a sterile fashion.  Local anesthesia: Topical Ethyl chloride.  With sterile technique and under real time ultrasound guidance: 30 mg/2 mL of OrthoVisc (sodium hyaluronate) in a prefilled syringe was injected easily into the knee through a 22-gauge needle. Completed without difficulty  Advised to call if fevers/chills, erythema, induration, drainage, or persistent bleeding.  Images permanently stored and available for review in PACS.  Impression: Technically successful ultrasound guided injection.  Independent interpretation of notes and tests performed by another provider:   None.  Brief History, Exam, Impression, and Recommendations:    Primary osteoarthritis of both knees Orthovisc 3 of 4 right knee, return in 1 week #4 of 4.    ____________________________________________ Joselyn Nicely. Sandy Crumb, M.D., ABFM., CAQSM., AME. Primary Care and Sports Medicine [facility]  Adjunct Professor of [facility] Medicine  [employer]  Restaurant manager, fast food

## 2024-03-28 NOTE — Assessment & Plan Note (Signed)
 Orthovisc 3 of 4 right knee, return in 1 week #4 of 4.

## 2024-03-28 NOTE — Addendum Note (Signed)
 Addended by: Montgomery Apgar on: 03/28/2024 10:02 AM   Modules accepted: Orders

## 2024-04-04 ENCOUNTER — Other Ambulatory Visit (INDEPENDENT_AMBULATORY_CARE_PROVIDER_SITE_OTHER)

## 2024-04-04 ENCOUNTER — Ambulatory Visit: Admitting: Sports Medicine

## 2024-04-04 DIAGNOSIS — M170 Bilateral primary osteoarthritis of knee: Secondary | ICD-10-CM

## 2024-04-04 DIAGNOSIS — E034 Atrophy of thyroid (acquired): Secondary | ICD-10-CM

## 2024-04-04 MED ORDER — HYALURONAN 30 MG/2ML IX SOSY
15.0000 mg | PREFILLED_SYRINGE | Freq: Once | INTRA_ARTICULAR | Status: AC
  Administered 2024-04-04: 15 mg via INTRA_ARTICULAR

## 2024-04-04 NOTE — Progress Notes (Signed)
    Procedures performed today:    Procedure: Real-time Ultrasound Guided injection of the right knee Device: Samsung HS60  Verbal informed consent obtained.  Time-out conducted.  Noted no overlying erythema, induration, or other signs of local infection.  Skin prepped in a sterile fashion.  Local anesthesia: Topical Ethyl chloride.  With sterile technique and under real time ultrasound guidance: 30 mg/2 mL of OrthoVisc (sodium hyaluronate) in a prefilled syringe was injected easily into the knee through a 22-gauge needle. Completed without difficulty  Advised to call if fevers/chills, erythema, induration, drainage, or persistent bleeding.  Images permanently stored and available for review in PACS.  Impression: Technically successful ultrasound guided injection.  Independent interpretation of notes and tests performed by another provider:   None.  Brief History, Exam, Impression, and Recommendations:    Primary osteoarthritis of both knees Orthovisc 4 of 4 right knee, return in 6 weeks as needed  Hypothyroidism We did recently decrease levothyroxine  to 137 mcg approximately 2 weeks ago, she will come back in about 4 weeks to recheck her TSH, white count was slightly elevated on the last set of labs we will recheck her CBC as well.    ____________________________________________ Joselyn Nicely. Sandy Crumb, M.D., ABFM., CAQSM., AME. Primary Care and Sports Medicine [facility]  Adjunct Professor of [facility] Medicine  [employer]  Restaurant manager, [employer]

## 2024-04-04 NOTE — Assessment & Plan Note (Signed)
Orthovisc 4 of 4 right knee, return in 6 weeks as needed.

## 2024-04-04 NOTE — Assessment & Plan Note (Signed)
 We did recently decrease levothyroxine  to 137 mcg approximately 2 weeks ago, she will come back in about 4 weeks to recheck her TSH, white count was slightly elevated on the last set of labs we will recheck her CBC as well.

## 2024-04-17 ENCOUNTER — Other Ambulatory Visit (HOSPITAL_BASED_OUTPATIENT_CLINIC_OR_DEPARTMENT_OTHER): Payer: Self-pay

## 2024-04-17 MED ORDER — WEGOVY 2.4 MG/0.75ML ~~LOC~~ SOAJ
2.4000 mg | SUBCUTANEOUS | 0 refills | Status: DC
  Filled 2024-04-17: qty 3, 28d supply, fill #0

## 2024-04-24 DIAGNOSIS — E669 Obesity, unspecified: Secondary | ICD-10-CM | POA: Diagnosis not present

## 2024-05-18 ENCOUNTER — Encounter: Payer: Self-pay | Admitting: Sports Medicine

## 2024-05-18 ENCOUNTER — Ambulatory Visit: Admitting: Sports Medicine

## 2024-05-18 VITALS — BP 123/73 | HR 60 | Resp 20 | Ht 69.0 in | Wt 279.0 lb

## 2024-05-18 DIAGNOSIS — M7662 Achilles tendinitis, left leg: Secondary | ICD-10-CM | POA: Diagnosis not present

## 2024-05-18 DIAGNOSIS — E034 Atrophy of thyroid (acquired): Secondary | ICD-10-CM

## 2024-05-18 DIAGNOSIS — M170 Bilateral primary osteoarthritis of knee: Secondary | ICD-10-CM | POA: Diagnosis not present

## 2024-05-18 NOTE — Progress Notes (Signed)
    Procedures performed today:    None.  Independent interpretation of notes and tests performed by another provider:   None.  Brief History, Exam, Impression, and Recommendations:    Obesity Fantastic weight loss, switched to Wegovy , historically she was 350 pounds in December 2023, she is down to 279 pounds. She will continue the journey.  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 Discussed the pathophysiology and anatomy, she will do heel lifts, eccentric heel drops with formal PT, return to see me in 6 weeks.  Primary osteoarthritis of both knees We have finished steroid injections and a series of Orthovisc for [facility], she continues to have pain, with some popping and catching, x-rays did show fairly severe osteoarthritis, MRI did show some extensive meniscal tearing, I was concerned that she would not be an arthroscopy candidate due to the degree of osteoarthritis however considering her mechanical symptoms I would like Dr. Cristy to weigh in. If he does not want to perform arthroscopy we will consider doing PRP.  I spent 40 minutes of total time managing this patient today, this includes chart review, face to face, and non-face to face time.  We did discuss multiple issues today.   ____________________________________________ Debby PARAS. Curtis, M.D., ABFM., CAQSM., AME. Primary Care and Sports Medicine [facility]  Adjunct Professor of [facility] Medicine  [employer]  Restaurant manager, fast food

## 2024-05-18 NOTE — Assessment & Plan Note (Signed)
 We have finished steroid injections and a series of Orthovisc for [facility], she continues to have pain, with some popping and catching, x-rays did show fairly severe osteoarthritis, MRI did show some extensive meniscal tearing, I was concerned that she would not be an arthroscopy candidate due to the degree of osteoarthritis however considering her mechanical symptoms I would like Dr. Cristy to weigh in. If he does not want to perform arthroscopy we will consider doing PRP.

## 2024-05-18 NOTE — Assessment & Plan Note (Signed)
 Fantastic weight loss, switched to Wegovy , historically she was 350 pounds in December 2023, she is down to 279 pounds. She will continue the journey.

## 2024-05-18 NOTE — Assessment & Plan Note (Signed)
 Not so much plantar heel pain anymore but she is having increasing pain posterior heel at the Achilles insertion. Discussed the pathophysiology and anatomy, she will do heel lifts, eccentric heel drops with formal PT, return to see me in 6 weeks.

## 2024-05-19 ENCOUNTER — Ambulatory Visit: Payer: Self-pay | Admitting: Sports Medicine

## 2024-05-19 LAB — CBC
Hematocrit: 41.2 % (ref 34.0–46.6)
Hemoglobin: 12.8 g/dL (ref 11.1–15.9)
MCH: 27.2 pg (ref 26.6–33.0)
MCHC: 31.1 g/dL — ABNORMAL LOW (ref 31.5–35.7)
MCV: 88 fL (ref 79–97)
Platelets: 369 x10E3/uL (ref 150–450)
RBC: 4.71 x10E6/uL (ref 3.77–5.28)
RDW: 14.1 % (ref 11.7–15.4)
WBC: 10.6 x10E3/uL (ref 3.4–10.8)

## 2024-05-19 LAB — T3, FREE: T3, Free: 2.3 pg/mL (ref 2.0–4.4)

## 2024-05-19 LAB — T4, FREE: Free T4: 1.28 ng/dL (ref 0.82–1.77)

## 2024-05-19 LAB — TSH: TSH: 1.1 u[IU]/mL (ref 0.450–4.500)

## 2024-05-24 DIAGNOSIS — E669 Obesity, unspecified: Secondary | ICD-10-CM | POA: Diagnosis not present

## 2024-05-25 DIAGNOSIS — M25562 Pain in left knee: Secondary | ICD-10-CM | POA: Diagnosis not present

## 2024-05-25 DIAGNOSIS — M25561 Pain in right knee: Secondary | ICD-10-CM | POA: Diagnosis not present

## 2024-05-29 ENCOUNTER — Other Ambulatory Visit (HOSPITAL_BASED_OUTPATIENT_CLINIC_OR_DEPARTMENT_OTHER): Payer: Self-pay

## 2024-05-29 MED ORDER — WEGOVY 2.4 MG/0.75ML ~~LOC~~ SOAJ
2.4000 mg | SUBCUTANEOUS | 0 refills | Status: DC
  Filled 2024-05-29: qty 3, 28d supply, fill #0

## 2024-06-19 DIAGNOSIS — G4733 Obstructive sleep apnea (adult) (pediatric): Secondary | ICD-10-CM | POA: Diagnosis not present

## 2024-06-24 DIAGNOSIS — E669 Obesity, unspecified: Secondary | ICD-10-CM | POA: Diagnosis not present

## 2024-06-27 ENCOUNTER — Other Ambulatory Visit (HOSPITAL_BASED_OUTPATIENT_CLINIC_OR_DEPARTMENT_OTHER): Payer: Self-pay

## 2024-06-27 MED ORDER — WEGOVY 2.4 MG/0.75ML ~~LOC~~ SOAJ
2.4000 mg | SUBCUTANEOUS | 0 refills | Status: DC
  Filled 2024-06-27: qty 3, 28d supply, fill #0

## 2024-06-29 ENCOUNTER — Ambulatory Visit: Admitting: Sports Medicine

## 2024-07-04 ENCOUNTER — Encounter: Payer: Self-pay | Admitting: Sports Medicine

## 2024-07-14 ENCOUNTER — Other Ambulatory Visit (HOSPITAL_BASED_OUTPATIENT_CLINIC_OR_DEPARTMENT_OTHER): Payer: Self-pay

## 2024-07-14 MED ORDER — WEGOVY 2.4 MG/0.75ML ~~LOC~~ SOAJ
2.4000 mg | SUBCUTANEOUS | 0 refills | Status: DC
  Filled 2024-07-14 – 2024-07-25 (×2): qty 3, 28d supply, fill #0

## 2024-07-25 ENCOUNTER — Other Ambulatory Visit (HOSPITAL_BASED_OUTPATIENT_CLINIC_OR_DEPARTMENT_OTHER): Payer: Self-pay

## 2024-07-25 DIAGNOSIS — E669 Obesity, unspecified: Secondary | ICD-10-CM | POA: Diagnosis not present

## 2024-08-14 ENCOUNTER — Other Ambulatory Visit (HOSPITAL_BASED_OUTPATIENT_CLINIC_OR_DEPARTMENT_OTHER): Payer: Self-pay

## 2024-08-14 MED ORDER — WEGOVY 2.4 MG/0.75ML ~~LOC~~ SOAJ
2.4000 mg | SUBCUTANEOUS | 0 refills | Status: DC
  Filled 2024-08-14 – 2024-08-26 (×2): qty 3, 28d supply, fill #0

## 2024-08-24 DIAGNOSIS — E669 Obesity, unspecified: Secondary | ICD-10-CM | POA: Diagnosis not present

## 2024-08-26 ENCOUNTER — Other Ambulatory Visit (HOSPITAL_BASED_OUTPATIENT_CLINIC_OR_DEPARTMENT_OTHER): Payer: Self-pay

## 2024-08-26 ENCOUNTER — Other Ambulatory Visit: Payer: Self-pay

## 2024-09-18 DIAGNOSIS — G4733 Obstructive sleep apnea (adult) (pediatric): Secondary | ICD-10-CM | POA: Diagnosis not present

## 2024-09-20 ENCOUNTER — Other Ambulatory Visit (HOSPITAL_BASED_OUTPATIENT_CLINIC_OR_DEPARTMENT_OTHER): Payer: Self-pay

## 2024-09-20 MED ORDER — WEGOVY 2.4 MG/0.75ML ~~LOC~~ SOAJ
2.4000 mg | SUBCUTANEOUS | 0 refills | Status: AC
  Filled 2024-09-20: qty 3, 28d supply, fill #0

## 2024-09-24 DIAGNOSIS — E669 Obesity, unspecified: Secondary | ICD-10-CM | POA: Diagnosis not present

## 2024-10-31 ENCOUNTER — Other Ambulatory Visit (HOSPITAL_BASED_OUTPATIENT_CLINIC_OR_DEPARTMENT_OTHER): Payer: Self-pay

## 2024-10-31 MED ORDER — WEGOVY 0.25 MG/0.5ML ~~LOC~~ SOAJ
0.2500 mg | SUBCUTANEOUS | 0 refills | Status: AC
  Filled 2024-10-31: qty 2, 28d supply, fill #0

## 2024-11-29 ENCOUNTER — Other Ambulatory Visit (HOSPITAL_BASED_OUTPATIENT_CLINIC_OR_DEPARTMENT_OTHER): Payer: Self-pay

## 2024-11-29 MED ORDER — MOUNJARO 2.5 MG/0.5ML ~~LOC~~ SOAJ
2.5000 mg | SUBCUTANEOUS | 0 refills | Status: AC
  Filled 2024-11-29: qty 2, 28d supply, fill #0
